# Patient Record
Sex: Male | Born: 1935 | Race: White | Hispanic: No | Marital: Single | Smoking: Former smoker
Health system: Southern US, Community
[De-identification: ages and names within clinical notes are randomized; demographics above are authoritative.]

## PROBLEM LIST (undated history)

## (undated) DIAGNOSIS — E7800 Pure hypercholesterolemia, unspecified: Secondary | ICD-10-CM

## (undated) DIAGNOSIS — E079 Disorder of thyroid, unspecified: Secondary | ICD-10-CM

## (undated) DIAGNOSIS — I509 Heart failure, unspecified: Secondary | ICD-10-CM

## (undated) DIAGNOSIS — I4891 Unspecified atrial fibrillation: Secondary | ICD-10-CM

## (undated) DIAGNOSIS — I10 Essential (primary) hypertension: Secondary | ICD-10-CM

## (undated) HISTORY — PX: CAROTID STENT: SHX1301

## (undated) HISTORY — PX: APPENDECTOMY: SHX54

## (undated) HISTORY — PX: REPLACEMENT TOTAL KNEE: SUR1224

---

## 2011-10-22 ENCOUNTER — Inpatient Hospital Stay (HOSPITAL_COMMUNITY)
Admission: EM | Admit: 2011-10-22 | Discharge: 2011-10-28 | DRG: 291 | Disposition: A | Discharge status: Skilled Nursing Facility | Payer: Medicare Other | Attending: Internal Medicine | Admitting: Internal Medicine

## 2011-10-22 ENCOUNTER — Encounter (HOSPITAL_COMMUNITY): Payer: Self-pay | Admitting: *Deleted

## 2011-10-22 ENCOUNTER — Emergency Department (HOSPITAL_COMMUNITY): Payer: Medicare Other

## 2011-10-22 DIAGNOSIS — I498 Other specified cardiac arrhythmias: Secondary | ICD-10-CM | POA: Diagnosis not present

## 2011-10-22 DIAGNOSIS — I509 Heart failure, unspecified: Principal | ICD-10-CM | POA: Diagnosis present

## 2011-10-22 DIAGNOSIS — E1149 Type 2 diabetes mellitus with other diabetic neurological complication: Secondary | ICD-10-CM | POA: Diagnosis present

## 2011-10-22 DIAGNOSIS — I10 Essential (primary) hypertension: Secondary | ICD-10-CM | POA: Diagnosis present

## 2011-10-22 DIAGNOSIS — Z794 Long term (current) use of insulin: Secondary | ICD-10-CM

## 2011-10-22 DIAGNOSIS — Z96659 Presence of unspecified artificial knee joint: Secondary | ICD-10-CM

## 2011-10-22 DIAGNOSIS — E039 Hypothyroidism, unspecified: Secondary | ICD-10-CM | POA: Diagnosis present

## 2011-10-22 DIAGNOSIS — J189 Pneumonia, unspecified organism: Secondary | ICD-10-CM | POA: Diagnosis present

## 2011-10-22 DIAGNOSIS — R001 Bradycardia, unspecified: Secondary | ICD-10-CM | POA: Diagnosis present

## 2011-10-22 DIAGNOSIS — IMO0001 Reserved for inherently not codable concepts without codable children: Secondary | ICD-10-CM | POA: Diagnosis present

## 2011-10-22 DIAGNOSIS — E7800 Pure hypercholesterolemia, unspecified: Secondary | ICD-10-CM | POA: Diagnosis present

## 2011-10-22 DIAGNOSIS — F411 Generalized anxiety disorder: Secondary | ICD-10-CM | POA: Diagnosis present

## 2011-10-22 DIAGNOSIS — J441 Chronic obstructive pulmonary disease with (acute) exacerbation: Secondary | ICD-10-CM | POA: Diagnosis present

## 2011-10-22 DIAGNOSIS — E876 Hypokalemia: Secondary | ICD-10-CM | POA: Diagnosis present

## 2011-10-22 DIAGNOSIS — I2510 Atherosclerotic heart disease of native coronary artery without angina pectoris: Secondary | ICD-10-CM | POA: Insufficient documentation

## 2011-10-22 DIAGNOSIS — Z87891 Personal history of nicotine dependence: Secondary | ICD-10-CM

## 2011-10-22 DIAGNOSIS — I4891 Unspecified atrial fibrillation: Secondary | ICD-10-CM | POA: Diagnosis present

## 2011-10-22 DIAGNOSIS — Z79899 Other long term (current) drug therapy: Secondary | ICD-10-CM

## 2011-10-22 DIAGNOSIS — E119 Type 2 diabetes mellitus without complications: Secondary | ICD-10-CM | POA: Diagnosis present

## 2011-10-22 HISTORY — DX: Essential (primary) hypertension: I10

## 2011-10-22 HISTORY — DX: Disorder of thyroid, unspecified: E07.9

## 2011-10-22 HISTORY — DX: Heart failure, unspecified: I50.9

## 2011-10-22 HISTORY — DX: Pure hypercholesterolemia, unspecified: E78.00

## 2011-10-22 HISTORY — DX: Unspecified atrial fibrillation: I48.91

## 2011-10-22 LAB — COMPREHENSIVE METABOLIC PANEL
AST: 15 U/L (ref 0–37)
Albumin: 3.5 g/dL (ref 3.5–5.2)
Alkaline Phosphatase: 61 U/L (ref 39–117)
BUN: 11 mg/dL (ref 6–23)
Chloride: 98 mEq/L (ref 96–112)
Potassium: 3 mEq/L — ABNORMAL LOW (ref 3.5–5.1)
Sodium: 136 mEq/L (ref 135–145)
Total Bilirubin: 0.9 mg/dL (ref 0.3–1.2)
Total Protein: 6.9 g/dL (ref 6.0–8.3)

## 2011-10-22 LAB — CBC
Hemoglobin: 13.8 g/dL (ref 13.0–17.0)
MCH: 28.5 pg (ref 26.0–34.0)
MCHC: 33.7 g/dL (ref 30.0–36.0)
Platelets: 137 10*3/uL — ABNORMAL LOW (ref 150–400)
RDW: 13.9 % (ref 11.5–15.5)

## 2011-10-22 LAB — DIFFERENTIAL
Basophils Absolute: 0 10*3/uL (ref 0.0–0.1)
Basophils Relative: 0 % (ref 0–1)
Eosinophils Absolute: 0.1 10*3/uL (ref 0.0–0.7)
Neutro Abs: 4.8 10*3/uL (ref 1.7–7.7)
Neutrophils Relative %: 68 % (ref 43–77)

## 2011-10-22 LAB — GLUCOSE, CAPILLARY
Glucose-Capillary: 158 mg/dL — ABNORMAL HIGH (ref 70–99)
Glucose-Capillary: 76 mg/dL (ref 70–99)

## 2011-10-22 LAB — TSH: TSH: 3.619 u[IU]/mL (ref 0.350–4.500)

## 2011-10-22 LAB — PRO B NATRIURETIC PEPTIDE: Pro B Natriuretic peptide (BNP): 6241 pg/mL — ABNORMAL HIGH (ref 0–450)

## 2011-10-22 LAB — MAGNESIUM: Magnesium: 2.3 mg/dL (ref 1.5–2.5)

## 2011-10-22 LAB — TROPONIN I: Troponin I: <0.3 ng/mL (ref <0.30)

## 2011-10-22 MED ORDER — SODIUM CHLORIDE 0.9 % IV SOLN: 250.0000 mL | INTRAVENOUS | Status: DC | PRN

## 2011-10-22 MED ORDER — PANTOPRAZOLE SODIUM 40 MG PO TBEC
40.0000 mg | DELAYED_RELEASE_TABLET | Freq: Every day | ORAL | Status: DC
  Administered 2011-10-23 – 2011-10-28 (×6): 40 mg via ORAL
  Filled 2011-10-22 (×9): qty 1

## 2011-10-22 MED ORDER — NITROGLYCERIN 2 % TD OINT
0.5000 [in_us] | TOPICAL_OINTMENT | Freq: Once | TRANSDERMAL | Status: AC
  Administered 2011-10-22: 0.5 [in_us] via TOPICAL
  Filled 2011-10-22: qty 30

## 2011-10-22 MED ORDER — MAGNESIUM HYDROXIDE 400 MG/5ML PO SUSP
15.0000 mL | Freq: Every day | ORAL | Status: DC | PRN
  Administered 2011-10-24: 15 mL via ORAL
  Filled 2011-10-22: qty 30

## 2011-10-22 MED ORDER — ACETAMINOPHEN 325 MG PO TABS: 650.0000 mg | ORAL_TABLET | Freq: Four times a day (QID) | ORAL | Status: DC | PRN

## 2011-10-22 MED ORDER — CARVEDILOL 12.5 MG PO TABS
12.5000 mg | ORAL_TABLET | Freq: Two times a day (BID) | ORAL | Status: DC
  Administered 2011-10-22 – 2011-10-24 (×4): 12.5 mg via ORAL
  Filled 2011-10-22 (×5): qty 1

## 2011-10-22 MED ORDER — FUROSEMIDE 10 MG/ML IJ SOLN
80.0000 mg | Freq: Once | INTRAMUSCULAR | Status: AC
  Administered 2011-10-22: 80 mg via INTRAVENOUS
  Filled 2011-10-22: qty 8

## 2011-10-22 MED ORDER — ISOSORBIDE MONONITRATE ER 60 MG PO TB24
60.0000 mg | ORAL_TABLET | Freq: Every day | ORAL | Status: DC
  Administered 2011-10-22 – 2011-10-28 (×7): 60 mg via ORAL
  Filled 2011-10-22 (×7): qty 1

## 2011-10-22 MED ORDER — BISACODYL 5 MG PO TBEC
5.0000 mg | DELAYED_RELEASE_TABLET | Freq: Every day | ORAL | Status: DC | PRN
  Filled 2011-10-22: qty 1

## 2011-10-22 MED ORDER — IPRATROPIUM BROMIDE 0.02 % IN SOLN
0.5000 mg | Freq: Four times a day (QID) | RESPIRATORY_TRACT | Status: DC | PRN
  Administered 2011-10-24: 0.5 mg via RESPIRATORY_TRACT
  Filled 2011-10-22: qty 2.5

## 2011-10-22 MED ORDER — ALBUTEROL SULFATE (5 MG/ML) 0.5% IN NEBU: 2.5000 mg | INHALATION_SOLUTION | Freq: Four times a day (QID) | RESPIRATORY_TRACT | Status: DC | PRN

## 2011-10-22 MED ORDER — POTASSIUM CHLORIDE CRYS ER 20 MEQ PO TBCR
40.0000 meq | EXTENDED_RELEASE_TABLET | ORAL | Status: AC
  Administered 2011-10-22: 40 meq via ORAL
  Filled 2011-10-22: qty 1

## 2011-10-22 MED ORDER — SODIUM CHLORIDE 0.9 % IV SOLN
Freq: Once | INTRAVENOUS | Status: AC
  Administered 2011-10-22: 11:00:00 via INTRAVENOUS

## 2011-10-22 MED ORDER — HEPARIN SODIUM (PORCINE) 5000 UNIT/ML IJ SOLN
5000.0000 [IU] | Freq: Three times a day (TID) | INTRAMUSCULAR | Status: DC
  Administered 2011-10-22 – 2011-10-28 (×20): 5000 [IU] via SUBCUTANEOUS
  Filled 2011-10-22 (×21): qty 1

## 2011-10-22 MED ORDER — SODIUM CHLORIDE 0.9 % IJ SOLN: 3.0000 mL | INTRAMUSCULAR | Status: DC | PRN

## 2011-10-22 MED ORDER — CLOPIDOGREL BISULFATE 75 MG PO TABS
75.0000 mg | ORAL_TABLET | Freq: Every day | ORAL | Status: DC
  Administered 2011-10-22 – 2011-10-28 (×7): 75 mg via ORAL
  Filled 2011-10-22 (×7): qty 1

## 2011-10-22 MED ORDER — POTASSIUM CHLORIDE CRYS ER 20 MEQ PO TBCR
20.0000 meq | EXTENDED_RELEASE_TABLET | Freq: Every day | ORAL | Status: DC
  Administered 2011-10-22 – 2011-10-23 (×2): 20 meq via ORAL
  Filled 2011-10-22 (×2): qty 1

## 2011-10-22 MED ORDER — SODIUM CHLORIDE 0.9 % IJ SOLN
3.0000 mL | Freq: Two times a day (BID) | INTRAMUSCULAR | Status: DC
  Administered 2011-10-24 – 2011-10-28 (×3): 3 mL via INTRAVENOUS

## 2011-10-22 MED ORDER — ZOLPIDEM TARTRATE 10 MG PO TABS
10.0000 mg | ORAL_TABLET | Freq: Every evening | ORAL | Status: DC | PRN
  Administered 2011-10-22: 10 mg via ORAL
  Filled 2011-10-22: qty 1

## 2011-10-22 MED ORDER — INSULIN ASPART 100 UNIT/ML ~~LOC~~ SOLN
0.0000 [IU] | Freq: Three times a day (TID) | SUBCUTANEOUS | Status: DC
  Administered 2011-10-22 – 2011-10-23 (×3): 3 [IU] via SUBCUTANEOUS
  Administered 2011-10-24 (×2): 2 [IU] via SUBCUTANEOUS
  Administered 2011-10-24: 3 [IU] via SUBCUTANEOUS
  Administered 2011-10-25: 11 [IU] via SUBCUTANEOUS
  Administered 2011-10-25: 5 [IU] via SUBCUTANEOUS
  Administered 2011-10-25: 8 [IU] via SUBCUTANEOUS
  Administered 2011-10-26: 5 [IU] via SUBCUTANEOUS
  Administered 2011-10-26: 3 [IU] via SUBCUTANEOUS
  Administered 2011-10-26: 8 [IU] via SUBCUTANEOUS
  Administered 2011-10-27: 5 [IU] via SUBCUTANEOUS
  Administered 2011-10-27: 8 [IU] via SUBCUTANEOUS
  Administered 2011-10-27: 5 [IU] via SUBCUTANEOUS
  Administered 2011-10-28: 3 [IU] via SUBCUTANEOUS
  Administered 2011-10-28: 5 [IU] via SUBCUTANEOUS

## 2011-10-22 MED ORDER — ASPIRIN EC 81 MG PO TBEC
81.0000 mg | DELAYED_RELEASE_TABLET | Freq: Every day | ORAL | Status: DC
  Administered 2011-10-22 – 2011-10-28 (×7): 81 mg via ORAL
  Filled 2011-10-22 (×7): qty 1

## 2011-10-22 MED ORDER — SODIUM CHLORIDE 0.9 % IJ SOLN
3.0000 mL | Freq: Two times a day (BID) | INTRAMUSCULAR | Status: DC
  Administered 2011-10-22 – 2011-10-28 (×11): 3 mL via INTRAVENOUS

## 2011-10-22 MED ORDER — ACETAMINOPHEN 650 MG RE SUPP: 650.0000 mg | Freq: Four times a day (QID) | RECTAL | Status: DC | PRN

## 2011-10-22 MED ORDER — SIMVASTATIN 10 MG PO TABS
10.0000 mg | ORAL_TABLET | Freq: Every day | ORAL | Status: DC
  Administered 2011-10-22 – 2011-10-27 (×6): 10 mg via ORAL
  Filled 2011-10-22 (×7): qty 1

## 2011-10-22 MED ORDER — ALPRAZOLAM 0.25 MG PO TABS
0.2500 mg | ORAL_TABLET | Freq: Once | ORAL | Status: AC
  Administered 2011-10-22: 0.25 mg via ORAL
  Filled 2011-10-22: qty 1

## 2011-10-22 MED ORDER — INSULIN ASPART 100 UNIT/ML ~~LOC~~ SOLN
0.0000 [IU] | Freq: Every day | SUBCUTANEOUS | Status: DC
  Administered 2011-10-24: 5 [IU] via SUBCUTANEOUS
  Administered 2011-10-25: 2 [IU] via SUBCUTANEOUS
  Administered 2011-10-26: 3 [IU] via SUBCUTANEOUS

## 2011-10-22 MED ORDER — LEVOFLOXACIN 500 MG PO TABS
500.0000 mg | ORAL_TABLET | Freq: Every day | ORAL | Status: AC
  Administered 2011-10-22 – 2011-10-25 (×4): 500 mg via ORAL
  Filled 2011-10-22 (×4): qty 1

## 2011-10-22 MED ORDER — GLIMEPIRIDE 2 MG PO TABS
2.0000 mg | ORAL_TABLET | Freq: Two times a day (BID) | ORAL | Status: DC
  Administered 2011-10-22 – 2011-10-28 (×12): 2 mg via ORAL
  Filled 2011-10-22 (×14): qty 1

## 2011-10-22 MED ORDER — FUROSEMIDE 10 MG/ML IJ SOLN
40.0000 mg | Freq: Two times a day (BID) | INTRAMUSCULAR | Status: DC
  Administered 2011-10-22 – 2011-10-23 (×3): 40 mg via INTRAVENOUS
  Filled 2011-10-22 (×6): qty 4

## 2011-10-22 MED ORDER — LEVOTHYROXINE SODIUM 25 MCG PO TABS
25.0000 ug | ORAL_TABLET | Freq: Every day | ORAL | Status: DC
  Administered 2011-10-23 – 2011-10-28 (×6): 25 ug via ORAL
  Filled 2011-10-22 (×7): qty 1

## 2011-10-22 NOTE — ED Notes (Signed)
Pt is getting agitated and getting himself worked up. rn and pt daughter reminded pt to stay calm and just relax. Pt urinal emptied.

## 2011-10-22 NOTE — ED Provider Notes (Signed)
History     CSN: [id number]  Arrival date & time 10/22/11  4098   First MD Initiated Contact with Patient 10/22/11 [phone]      Chief Complaint  Patient presents with  . Cough    (Consider location/radiation/quality/duration/timing/severity/associated sxs/prior treatment) HPI This elderly patient presents with concerns of cough, mild dyspnea.  He notes that his symptoms began approximately 3 weeks ago, gradually.  His crit his initial symptoms of generalized discomfort, cough.  5 days ago he was diagnosed with pneumonia at his primary care physician's office.  For the following days he has been taking antibiotics, most recently levofloxacin.  He notes that in spite of this therapy he continues to have cough, dyspnea.  He denies any chest pain, near-syncope, vomiting, diarrhea.  He notes continued intermittent lower extremity edema. The patient denies confusion, disorientation, ataxia, but the patient's daughter notes that he is more confused than typical. Notably, the patient had pulmonary function tests within the past week that were reported to be normal.  He does have a history of both COPD, and CHF as well as CAD. Past Medical History  Diagnosis Date  . Hypertension   . Diabetes mellitus   . Thyroid disease   . High cholesterol   . Atrial fibrillation   . CHF (congestive heart failure)   . Emphysema     35% of lungs    Past Surgical History  Procedure Date  . Carotid stent     on blood thinner for stent  . Replacement total knee     left knee  . Appendectomy     History reviewed. No pertinent family history.  History  Substance Use Topics  . Smoking status: Former Smoker -- 3.0 packs/day    Types: Cigarettes  . Smokeless tobacco: Not on file  . Alcohol Use: No      Review of Systems  Constitutional:       Per HPI, otherwise negative  HENT:       Per HPI, otherwise negative  Eyes: Negative.   Respiratory:       Per HPI, otherwise negative  Cardiovascular:         Per HPI, otherwise negative  Gastrointestinal: Negative for vomiting.  Genitourinary: Negative.   Musculoskeletal:       Per HPI, otherwise negative  Skin: Negative.   Neurological: Negative for syncope.    Allergies  Sulfa drugs cross reactors  Home Medications  No current outpatient prescriptions on file.  BP 135/62  Pulse 60  Temp(Src) 97.8 F (36.6 C) (Oral)  Resp 22  SpO2 96%  Physical Exam  Nursing note and vitals reviewed. Constitutional: He is oriented to person, place, and time. He appears well-developed. No distress.  HENT:  Head: Normocephalic and atraumatic.  Eyes: Conjunctivae and EOM are normal.  Cardiovascular: Normal rate and regular rhythm.   Pulmonary/Chest: Effort normal. No stridor. No respiratory distress.  Abdominal: He exhibits no distension.  Musculoskeletal: He exhibits no edema.  Neurological: He is alert and oriented to person, place, and time.  Skin: Skin is warm and dry.  Psychiatric: He has a normal mood and affect.    ED Course  Procedures (including critical care time)   Labs Reviewed  CBC  DIFFERENTIAL  COMPREHENSIVE METABOLIC PANEL  PRO B NATRIURETIC PEPTIDE   No results found.   No diagnosis found.  Cardiac monitor: 61- afib, abnormal  Pulse ox 97% ra- normal   Date: 10/22/2011  Rate: 67  Rhythm: atrial fibrillation  QRS Axis: normal  Intervals: normal  ST/T Wave abnormalities: diffuse non-specific changes  Conduction Disutrbances:none and nonspecific intraventricular conduction delay  Narrative Interpretation:   Old EKG Reviewed: none available ABNORMAL  CXR reviewed by me MDM  This previously well male presents with concerns over ongoing cough, dyspnea.  Notably, the patient was recently diagnosed with pneumonia, though it is unclear if this was done after radiographic imaging.  On my examination is in no distress, though has a mild cough.  Patient has no wheezing.  Given the patient's history of COPD,  CHF, this percentage is likely multifactorial, though his recent unremarkable long function tests and today his elevated BNP is most consistent with a heart failure exacerbation.  The patient received Lasix, topical nitroglycerin in the ED.  He was admitted for further evaluation and management.       Gerhard Munch, MD 10/22/11 1130

## 2011-10-22 NOTE — ED Notes (Signed)
rn encouraged pt to relax, pt wants to get up and get out of bed. rn told pt that he needed to stay in bed right now and that his meal tray had been ordered.

## 2011-10-22 NOTE — ED Notes (Addendum)
hospitalist to see pt

## 2011-10-22 NOTE — H&P (Signed)
Hospital Admission Note Date: 10/22/2011  Patient name: Carlos Morton Medical record number: [id number] Date of birth: January 19, 1917 Age: 78 y.o. Gender: male PCP: Karle Plumber, MD, MD  Attending physician: Altha Harm, MD  Chief Complaint:SOB and DOE x several days.  History of Present Illness: Patient is a elderly gentleman who has been having shortness of breath and dyspnea on exertion for several weeks. He was seen by his primary care physician and treated for pneumonia. He was also given a double shot of antibiotics and steroids proximately 4 days ago and transitioned to Levaquin 750 mg to take a total of 7 days. The patient is currently on day #3 of Levaquin. According to the patient's daughter he had only a very low-grade temperature of 99.0 however never had any fever or chills. He has been coughing which has been productive of whitish sputum. The patient denies any orthopnea or PND. He states that he has chronic pedal edema which usually receives at night when he puts his legs up. He has not noted any change in the pedal edema. The patient does not check his weight daily and is unable to tell me he is having significant leaking. However from the field the skull to does not appear he has had any weight gain. The patient's daughter also reports that she has noted that he's been wheezing at night and states that he received an inhaler from his primary care physician on Monday I will she's been using about 3 times a day. Despite all these interventions the patient does not appear to be making any progress in recovering and his feeling of weakness and shortness of breath appears to be getting worse as they came to the emergency room today for further evaluation and management.  The patient denies any chest pain or palpitations, he denies any dizziness, he denies any loss of consciousness. The patient's daughter does report that he has a component of anxiety which is exhibited in the emergency  room and she states that that has been occurring since the patient has been ill. She also states that the patient appears to have some periods of disorientation in the recent weeks.  Scheduled Meds:   . sodium chloride   Intravenous Once  . ALPRAZolam  0.25 mg Oral Once  . aspirin EC  81 mg Oral Daily  . carvedilol  12.5 mg Oral BID AC  . clopidogrel  75 mg Oral Daily  . furosemide  40 mg Intravenous Q12H  . furosemide  80 mg Intravenous Once  . glimepiride  2 mg Oral BID AC  . heparin  5,000 Units Subcutaneous Q8H  . insulin aspart  0-15 Units Subcutaneous TID WC  . insulin aspart  0-5 Units Subcutaneous QHS  . isosorbide mononitrate  60 mg Oral Daily  . levofloxacin  500 mg Oral Daily  . levothyroxine  25 mcg Oral Daily  . nitroGLYCERIN  0.5 inch Topical Once  . pantoprazole  40 mg Oral Q0600  . potassium chloride SA  20 mEq Oral Daily  . simvastatin  10 mg Oral q1800  . sodium chloride  3 mL Intravenous Q12H  . sodium chloride  3 mL Intravenous Q12H   Continuous Infusions:  PRN Meds:.sodium chloride, acetaminophen, acetaminophen, albuterol, bisacodyl, ipratropium, magnesium hydroxide, sodium chloride, zolpidem Allergies: Sulfa drugs cross reactors Past Medical History  Diagnosis Date  . Hypertension   . Diabetes mellitus   . Thyroid disease   . High cholesterol   . Atrial fibrillation   .  CHF (congestive heart failure)   . Emphysema     35% of lungs   Past Surgical History  Procedure Date  . Carotid stent     on blood thinner for stent  . Replacement total knee     left knee  . Appendectomy    History reviewed. No pertinent family history. History   Social History  . Marital Status: Single    Spouse Name: N/A    Number of Children: N/A  . Years of Education: N/A   Occupational History  . Not on file.   Social History Main Topics  . Smoking status: Former Smoker -- 3.0 packs/day    Types: Cigarettes  . Smokeless tobacco: Not on file  . Alcohol Use: No   . Drug Use: No  . Sexually Active:    Other Topics Concern  . Not on file   Social History Narrative  . No narrative on file   Review of Systems: A comprehensive review of systems was negative. Physical Exam:  Intake/Output Summary (Last 24 hours) at 10/22/11 1246 Last data filed at 10/22/11 1145  Gross per 24 hour  Intake      0 ml  Output    650 ml  Net   -650 ml   General: Alert, awake, oriented x3, in no acute distress.  HEENT: [place]/AT [place]ER[place], EOMI Neck: Trachea midline,  no masses, no thyromegal,y no JVD, no carotid bruit OROPHARYNX:  Moist, No exudate/ erythema/lesions.  Heart: Regular rate and rhythm, without murmurs, rubs, gallops, PMI non-displaced, no heaves or thrills on palpation.  Lungs: Mild diffuse wheezing. No rhonchi noted. No increased vocal fremitus.  Abdomen: Soft, nontender, nondistended, positive bowel sounds, no masses no hepatosplenomegaly noted..  Neuro: No focal neurological deficits noted cranial nerves II through XII grossly intact. DTRs 2+ bilaterally upper and lower extremities. Strength functional in bilateral upper and lower extremities. Musculoskeletal: No warm swelling or erythema around joints, no spinal tenderness noted. Psychiatric: Patient alert and oriented x3, but agitated. Lymph node survey: No cervical axillary or inguinal lymphadenopathy noted.  Lab results:  [facility] 10/22/11 0910  NA 136  K 3.0*  CL 98  CO2 29  GLUCOSE 175*  BUN 11  CREATININE 0.79  CALCIUM 8.9  MG --  PHOS --    Basename 10/22/11 0910  AST 15  ALT 8  ALKPHOS 61  BILITOT 0.9  PROT 6.9  ALBUMIN 3.5   No results found for this basename: LIPASE:2,AMYLASE:2 in the last 72 hours  Basename 10/22/11 0910  WBC 7.0  NEUTROABS 4.8  HGB 13.8  HCT 41.0  MCV 84.7  PLT 137*    Basename 10/22/11 0910  CKTOTAL --  CKMB --  CKMBINDEX --  TROPONINI <0.30   No components found with this basename: POCBNP:3 No results found for this basename: DDIMER:2 in  the last 72 hours No results found for this basename: HGBA1C:2 in the last 72 hours No results found for this basename: CHOL:2,HDL:2,LDLCALC:2,TRIG:2,CHOLHDL:2,LDLDIRECT:2 in the last 72 hours No results found for this basename: TSH,T4TOTAL,FREET3,T3FREE,THYROIDAB in the last 72 hours No results found for this basename: VITAMINB12:2,FOLATE:2,FERRITIN:2,TIBC:2,IRON:2,RETICCTPCT:2 in the last 72 hours Imaging results:  Dg Chest 2 View  10/22/2011  *RADIOLOGY REPORT*  Clinical Data: Diabetes, shortness of breath, congestion  CHEST - 2 VIEW  Comparison: None.  Findings: The lungs are slightly hyperaerated suggesting a degree of COPD.  Minimally prominent interstitial markings are present at the lung bases most likely chronic.  Moderate cardiomegaly is present.  The  bones are osteopenic.  There is a mild kyphoscoliosis present.  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  No definite active process. Moderate cardiomegaly.  Original Report Authenticated By: Juline Patch, M.D.   Other results: EKG: normal EKG, normal sinus rhythm, unchanged from previous tracings, atrial fibrillation, rate controlled.   Patient Active Hospital Problem List: Diabetes mellitus type 2 with complications (10/22/2011)   Assessment: Patient has diabetes type 2 and reports diabetic neuropathy associated with it. I will check a hemoglobin A1c put the patient on sliding scale and root see him his Amaryl     CHF exacerbation (10/22/2011)   Assessment: The patient appears to have some component of CHF exacerbation. It is unclear as to whether or not this patient has systolic or diastolic dysfunction. I will try to obtain records from his primary care physician's office for 2-D echocardiogram and recent stress test that he reports he's had within the last year. He does have an elevated pro BNP we will go ahead with gentle diuresis with IV Lasix and monitor his weights daily as well as an ice to nose and keep a close eye on his  renal function. I will also attempt to get records from his primary care physician's office for long-term assessment of his renal function.    COPD bronchitis (10/22/2011)   Assessment: The patient has known emphysema. I believe the patient may be having a component of bronchitis is contributing to this. He'll be observed for exacerbation of COPD and the need for steroids. At present I do not believe that steroids are indicated in this patient as he does not have an increased work of breathing and his saturations are within normal limits.   Hypothyroidism (acquired) (10/22/2011)   Assessment: We will check a TSH on the patient and continue his Synthroid at prehospital prescribed dosing    Atrial Fibrillation with controlled ventricular response (10/22/2011)   Assessment: Patient has a history of atrial fibrillation presently has a controlled ventricular response. He'll be on telemetry monitoring and we will monitor him for any loss of rate control.    Hypokalemia   Assessment: Repleted orally     Anxiety   Assessment: We'll do a trial of Xanax and see how patient responds.    MATTHEWS,MICHELLE A. 10/22/2011, 12:46 PM

## 2011-10-22 NOTE — ED Notes (Signed)
md at bedside

## 2011-10-22 NOTE — ED Notes (Signed)
Pt  Provided with a urinal and extra pillow.

## 2011-10-22 NOTE — ED Notes (Addendum)
Per ems pt is from home. Alert and oriented x4, needs assistance with ambulation slightly. Pt had the flu 3 weeks ago, with vomitting spells. For the last 2 weeks pt has had productive white cough. Pt reports he is on abx, but ems did not see any abx medications. Pt will get coughing spells and then have painful breathing. Pt reports his O2 stats remain low normally, O2 did not drop below 95% on room air the whole transport.  Pt dx with pneumonia 6 days ago. On Levofloxacin 500 mg PO daily, started abx 4/15.   Pt also reports that he has CHF and in the past "had emphasema" and that a few years ago his dr said 35% of his lungs were affected.  Pt had a lung capacity test 2 days ago and dr said scores were great.

## 2011-10-22 NOTE — ED Notes (Signed)
First attempt at giving report, floor rn will call back in 5 min

## 2011-10-22 NOTE — Progress Notes (Signed)
   CARE MANAGEMENT NOTE 10/22/2011  Patient:  Carlos Morton, Carlos Morton   Account Number:  [id number]  Date Initiated:  10/22/2011  Documentation initiated by:  Lanier Clam  Subjective/Objective Assessment:   ADMITTED W/COUGH.HX: DM,COPD,CHF.     Action/Plan:   FROM [facility].   Anticipated DC Date:  10/26/2011   Anticipated DC Plan:  HOME/SELF CARE         Choice offered to / List presented to:             Status of service:  In process, will continue to follow Medicare Important Message given?   (If response is "NO", the following Medicare IM given date fields will be blank) Date Medicare IM given:   Date Additional Medicare IM given:    Discharge Disposition:    Per UR Regulation:  Reviewed for med. necessity/level of care/duration of stay  If discussed at Long Length of Stay Meetings, dates discussed:    Comments:  10/22/11 [facility] RN,BSN NCM [phone]

## 2011-10-22 NOTE — ED Notes (Addendum)
Pt to xray  Pt alert and oriented x4. Respirations even and unlabored, bilateral symmetrical rise and fall of chest. Skin warm and dry. In no acute distress. Denies needs.   

## 2011-10-22 NOTE — ED Notes (Signed)
Report given to amy, rn on floor.  

## 2011-10-22 NOTE — ED Notes (Signed)
Pt provided with gram crackers and peanut butter.

## 2011-10-23 LAB — BASIC METABOLIC PANEL
CO2: 29 mEq/L (ref 19–32)
Chloride: 98 mEq/L (ref 96–112)
Glucose, Bld: 148 mg/dL — ABNORMAL HIGH (ref 70–99)
Potassium: 3.3 mEq/L — ABNORMAL LOW (ref 3.5–5.1)
Sodium: 138 mEq/L (ref 135–145)

## 2011-10-23 LAB — GLUCOSE, CAPILLARY
Glucose-Capillary: 155 mg/dL — ABNORMAL HIGH (ref 70–99)
Glucose-Capillary: 112 mg/dL — ABNORMAL HIGH (ref 70–99)

## 2011-10-23 LAB — PRO B NATRIURETIC PEPTIDE: Pro B Natriuretic peptide (BNP): 5437 pg/mL — ABNORMAL HIGH (ref 0–450)

## 2011-10-23 LAB — MRSA PCR SCREENING: MRSA by PCR: NEGATIVE

## 2011-10-23 MED ORDER — POTASSIUM CHLORIDE CRYS ER 20 MEQ PO TBCR
40.0000 meq | EXTENDED_RELEASE_TABLET | Freq: Two times a day (BID) | ORAL | Status: DC
  Administered 2011-10-23 – 2011-10-28 (×10): 40 meq via ORAL
  Filled 2011-10-23 (×11): qty 2

## 2011-10-23 MED ORDER — FUROSEMIDE 10 MG/ML IJ SOLN
20.0000 mg | Freq: Three times a day (TID) | INTRAMUSCULAR | Status: DC
  Administered 2011-10-23 – 2011-10-27 (×12): 20 mg via INTRAVENOUS
  Filled 2011-10-23 (×15): qty 2

## 2011-10-23 MED ORDER — ALPRAZOLAM 0.25 MG PO TABS
0.2500 mg | ORAL_TABLET | Freq: Three times a day (TID) | ORAL | Status: DC | PRN
  Administered 2011-10-23 – 2011-10-27 (×9): 0.25 mg via ORAL
  Filled 2011-10-23 (×9): qty 1

## 2011-10-23 MED ORDER — LORAZEPAM 0.5 MG PO TABS
0.5000 mg | ORAL_TABLET | Freq: Once | ORAL | Status: AC
  Administered 2011-10-23: 0.5 mg via ORAL
  Filled 2011-10-23: qty 1

## 2011-10-23 MED ORDER — POTASSIUM CHLORIDE CRYS ER 20 MEQ PO TBCR
40.0000 meq | EXTENDED_RELEASE_TABLET | Freq: Once | ORAL | Status: AC
  Administered 2011-10-23: 40 meq via ORAL
  Filled 2011-10-23: qty 2

## 2011-10-23 MED ORDER — FUROSEMIDE 10 MG/ML IJ SOLN: 20.0000 mg | Freq: Two times a day (BID) | INTRAMUSCULAR | Status: DC

## 2011-10-23 NOTE — Progress Notes (Signed)
Subjective: Patient states that he had a difficult time sleeping last night. I suspect the patient is having anxiety. I had at that time with the patient and he agreed that his anxiety causes him to have some difficulty with sleeping and expressed fear that he felt that if he did not stay awake he may not wake up.  Objective: Filed Vitals:   10/22/11 1544 10/22/11 2118 10/23/11 0500 10/23/11 1300  BP: 151/89 106/59 112/62 98/56  Pulse: 67 57 62 55  Temp: 97.6 F (36.4 C) 97.3 F (36.3 C) 97.2 F (36.2 C) 97.6 F (36.4 C)  TempSrc: Axillary Oral Axillary Oral  Resp: 20 19 18 18   Height: 5\' 8"  (1.727 m)     Weight: 74.7 kg (164 lb 10.9 oz)  68.3 kg (150 lb 9.2 oz)   SpO2: 94% 96% 95% 95%   Weight change:   Intake/Output Summary (Last 24 hours) at 10/23/11 1749 Last data filed at 10/23/11 1617  Gross per 24 hour  Intake    761 ml  Output   1025 ml  Net   -264 ml    General: Alert, awake, oriented x3, in no acute distress. Mildly anxious.  HEENT: [place]/AT [place]ER[place], EOMI Neck: Trachea midline,  no masses, no thyromegal,y no JVD, no carotid bruit OROPHARYNX:  Moist, No exudate/ erythema/lesions.  Heart: Irregularly irregular. In atrial fibrillation with heart rate controlled. Lungs: Clear to auscultation, no wheezing or rhonchi noted.  Abdomen: Soft, nontender, nondistended, positive bowel sounds, no masses no hepatosplenomegaly noted. Psychiatric: Patient alert and oriented x3, mildly anxious.   Lab Results:  [facility] 10/23/11 0425 10/22/11 0910  NA 138 136  K 3.3* 3.0*  CL 98 98  CO2 29 29  GLUCOSE 148* 175*  BUN 16 11  CREATININE 1.13 0.79  CALCIUM 9.5 8.9  MG -- 2.3  PHOS -- --    Basename 10/22/11 0910  AST 15  ALT 8  ALKPHOS 61  BILITOT 0.9  PROT 6.9  ALBUMIN 3.5   No results found for this basename: LIPASE:2,AMYLASE:2 in the last 72 hours  Basename 10/22/11 0910  WBC 7.0  NEUTROABS 4.8  HGB 13.8  HCT 41.0  MCV 84.7  PLT 137*    Basename 10/22/11  0910  CKTOTAL --  CKMB --  CKMBINDEX --  TROPONINI <0.30   No components found with this basename: POCBNP:3 No results found for this basename: DDIMER:2 in the last 72 hours  Basename 10/22/11 1300  HGBA1C 7.6*   No results found for this basename: CHOL:2,HDL:2,LDLCALC:2,TRIG:2,CHOLHDL:2,LDLDIRECT:2 in the last 72 hours  Basename 10/22/11 1300  TSH 3.619  T4TOTAL --  T3FREE --  THYROIDAB --   No results found for this basename: VITAMINB12:2,FOLATE:2,FERRITIN:2,TIBC:2,IRON:2,RETICCTPCT:2 in the last 72 hours  Micro Results: Recent Results (from the past 240 hour(s))  MRSA PCR SCREENING     Status: Normal   Collection Time   10/23/11  2:17 AM      Component Value Range Status Comment   MRSA by PCR NEGATIVE  NEGATIVE  Final     Studies/Results: Dg Chest 2 View  10/22/2011  *RADIOLOGY REPORT*  Clinical Data: Diabetes, shortness of breath, congestion  CHEST - 2 VIEW  Comparison: None.  Findings: The lungs are slightly hyperaerated suggesting a degree of COPD.  Minimally prominent interstitial markings are present at the lung bases most likely chronic.  Moderate cardiomegaly is present.  The bones are osteopenic.  There is a mild kyphoscoliosis present.  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  No definite active process. Moderate cardiomegaly.  Original Report Authenticated By: Juline Patch, M.D.    Medications: I have reviewed the patient's current medications. Scheduled Meds:   . aspirin EC  81 mg Oral Daily  . carvedilol  12.5 mg Oral BID AC  . clopidogrel  75 mg Oral Daily  . furosemide  20 mg Intravenous TID  . glimepiride  2 mg Oral BID AC  . heparin  5,000 Units Subcutaneous Q8H  . insulin aspart  0-15 Units Subcutaneous TID WC  . insulin aspart  0-5 Units Subcutaneous QHS  . isosorbide mononitrate  60 mg Oral Daily  . levofloxacin  500 mg Oral Daily  . levothyroxine  25 mcg Oral QAC breakfast  . LORazepam  0.5 mg Oral Once  . pantoprazole  40 mg Oral  Q0600  . potassium chloride  40 mEq Oral BID  . potassium chloride  40 mEq Oral Once  . simvastatin  10 mg Oral q1800  . sodium chloride  3 mL Intravenous Q12H  . sodium chloride  3 mL Intravenous Q12H  . DISCONTD: furosemide  20 mg Intravenous Q12H  . DISCONTD: furosemide  40 mg Intravenous Q12H  . DISCONTD: potassium chloride SA  20 mEq Oral Daily   Continuous Infusions:  PRN Meds:.sodium chloride, acetaminophen, acetaminophen, albuterol, ALPRAZolam, bisacodyl, ipratropium, magnesium hydroxide, sodium chloride, zolpidem Assessment/Plan: Patient Active Hospital Problem List: Diabetes mellitus type 2 with complications (10/22/2011)   Assessment: Blood sugars well-controlled he air however hemoglobin A1c mildly elevated at 7.6 reflecting suboptimal control.    CHF exacerbation (10/22/2011)   Assessment: We'll continue diuresis with Lasix for now. I will cut the Lasix down to 60 mg per day in 3 divided doses versus 80 mg per day.    COPD bronchitis (10/22/2011)   Assessment: Patient has had very little cough today and there is no wheezing present we'll continue to give when necessary albuterol monitor him to    Hypothyroidism (acquired) (10/22/2011)   Assessment: Thyroid was supplemented with TSH in normal range of 3.619    Atrial fibrillation with controlled ventricular response (10/22/2011)   Assessment: Heart rate remains well controlled.       LOS: 1 day

## 2011-10-23 NOTE — Progress Notes (Signed)
CSW spoke with patients daughter. She is requesting SNF at [facility] upon discharge. At this time there is not enough information on the chart to complete and FL2. Will follow.  Elcie Pelster C. Kinston Magnan MSW, LCSW [phone]

## 2011-10-23 NOTE — Progress Notes (Signed)
   CARE MANAGEMENT NOTE 10/23/2011  Patient:  Carlos Morton, Carlos Morton   Account Number:  [id number]  Date Initiated:  10/22/2011  Documentation initiated by:  Lanier Clam  Subjective/Objective Assessment:   ADMITTED W/COUGH.HX: DM,COPD,CHF.     Action/Plan:   FROM [facility].   Anticipated DC Date:  10/26/2011   Anticipated DC Plan:  HOME/SELF CARE         Choice offered to / List presented to:             Status of service:  In process, will continue to follow Medicare Important Message given?   (If response is "NO", the following Medicare IM given date fields will be blank) Date Medicare IM given:   Date Additional Medicare IM given:    Discharge Disposition:    Per UR Regulation:  Reviewed for med. necessity/level of care/duration of stay  If discussed at Long Length of Stay Meetings, dates discussed:    Comments:  10/23/11 Rilynne Lonsway RN,BSN NCM [phone] SPOKE TO PATIENT/SON IN LAW ABOUT D/C PLANS.HAS CANE,RW.FAMILY WILL BE ABLE TO TRANSPORT HOME WHEN MED STABLE.  10/22/11 Danine Hor RN,BSN NCM [phone]

## 2011-10-24 LAB — BASIC METABOLIC PANEL
BUN: 23 mg/dL (ref 6–23)
Calcium: 9.5 mg/dL (ref 8.4–10.5)
Creatinine, Ser: 1.41 mg/dL — ABNORMAL HIGH (ref 0.50–1.35)
GFR calc Af Amer: 48 mL/min — ABNORMAL LOW (ref >90)

## 2011-10-24 LAB — PRO B NATRIURETIC PEPTIDE: Pro B Natriuretic peptide (BNP): 1770 pg/mL — ABNORMAL HIGH (ref 0–450)

## 2011-10-24 LAB — GLUCOSE, CAPILLARY
Glucose-Capillary: 142 mg/dL — ABNORMAL HIGH (ref 70–99)
Glucose-Capillary: 197 mg/dL — ABNORMAL HIGH (ref 70–99)
Glucose-Capillary: 126 mg/dL — ABNORMAL HIGH (ref 70–99)
Glucose-Capillary: 365 mg/dL — ABNORMAL HIGH (ref 70–99)

## 2011-10-24 MED ORDER — METHYLPREDNISOLONE SODIUM SUCC 125 MG IJ SOLR
60.0000 mg | Freq: Four times a day (QID) | INTRAMUSCULAR | Status: AC
Start: 2011-10-24 — End: 2011-10-26
  Administered 2011-10-24 – 2011-10-26 (×10): 60 mg via INTRAVENOUS
  Filled 2011-10-24 (×11): qty 0.96

## 2011-10-24 MED ORDER — ALBUTEROL SULFATE (5 MG/ML) 0.5% IN NEBU
INHALATION_SOLUTION | RESPIRATORY_TRACT | Status: AC
  Administered 2011-10-24: 2.5 mg
  Filled 2011-10-24: qty 0.5

## 2011-10-24 MED ORDER — ALBUTEROL SULFATE (5 MG/ML) 0.5% IN NEBU: 2.5000 mg | INHALATION_SOLUTION | RESPIRATORY_TRACT | Status: DC | PRN

## 2011-10-24 MED ORDER — CARVEDILOL 12.5 MG PO TABS
12.5000 mg | ORAL_TABLET | Freq: Two times a day (BID) | ORAL | Status: DC
  Administered 2011-10-24 – 2011-10-25 (×3): 12.5 mg via ORAL
  Filled 2011-10-24 (×5): qty 1

## 2011-10-24 NOTE — Progress Notes (Deleted)
.*   NOTE WRITTEN ON WRONG PATIENT*Pt HR as low as 32-37 then comes up to low 40's but sustaining. Pt is stable and asymptomatic. Pt had 2.9sec pause at ~1135. Dr Ashley Royalty made aware. Will continue to monitor.* NOTE WRITTEN ON WRONG PATIENT*

## 2011-10-24 NOTE — Progress Notes (Signed)
Subjective: Patient still having subjective difficulty breathing however he is not tachypneic or hypoxic. He denies a tight feeling of his chest but feels as though he's not able to breathe well. Objective: Filed Vitals:   10/24/11 0602 10/24/11 0915 10/24/11 1346 10/24/11 1418  BP: 114/66   112/66  Pulse: 60   45  Temp: 97.5 F (36.4 C)   96.5 F (35.8 C)  TempSrc: Oral   Axillary  Resp: 18   18  Height:      Weight: 70.308 kg (155 lb)     SpO2: 95% 91% 95% 99%   Weight change: -4.393 kg (-9 lb 10.9 oz)  Intake/Output Summary (Last 24 hours) at 10/24/11 1823 Last data filed at 10/24/11 1739  Gross per 24 hour  Intake    600 ml  Output   1700 ml  Net  -1100 ml    General: Alert, awake, oriented x3, in no acute distress. Mildly anxious.  HEENT: [place]/AT [place]ER[place], EOMI Neck: Trachea midline,  no masses, no thyromegal,y no JVD, no carotid bruit OROPHARYNX:  Moist, No exudate/ erythema/lesions.  Heart: Irregularly irregular. In atrial fibrillation with heart rate controlled. Lungs: Decreased air entry, no wheezing or rhonchi noted. Please note that after breathing treatment administered I listened to the patient and his air entry was improved Abdomen: Soft, nontender, nondistended, positive bowel sounds, no masses no hepatosplenomegaly noted. Psychiatric: Patient alert and oriented x3, mildly anxious.   Lab Results:  Basename 10/24/11 0437 10/23/11 0425 10/22/11 0910  NA 137 138 --  K 4.1 3.3* --  CL 99 98 --  CO2 30 29 --  GLUCOSE 133* 148* --  BUN 23 16 --  CREATININE 1.41* 1.13 --  CALCIUM 9.5 9.5 --  MG 2.0 -- 2.3  PHOS -- -- --    Basename 10/22/11 0910  AST 15  ALT 8  ALKPHOS 61  BILITOT 0.9  PROT 6.9  ALBUMIN 3.5   No results found for this basename: LIPASE:2,AMYLASE:2 in the last 72 hours  Basename 10/22/11 0910  WBC 7.0  NEUTROABS 4.8  HGB 13.8  HCT 41.0  MCV 84.7  PLT 137*    Basename 10/22/11 0910  CKTOTAL --  CKMB --  CKMBINDEX --  TROPONINI  <0.30   No components found with this basename: POCBNP:3 No results found for this basename: DDIMER:2 in the last 72 hours  Basename 10/22/11 1300  HGBA1C 7.6*   No results found for this basename: CHOL:2,HDL:2,LDLCALC:2,TRIG:2,CHOLHDL:2,LDLDIRECT:2 in the last 72 hours  Basename 10/22/11 1300  TSH 3.619  T4TOTAL --  T3FREE --  THYROIDAB --   No results found for this basename: VITAMINB12:2,FOLATE:2,FERRITIN:2,TIBC:2,IRON:2,RETICCTPCT:2 in the last 72 hours  Micro Results: Recent Results (from the past 240 hour(s))  MRSA PCR SCREENING     Status: Normal   Collection Time   10/23/11  2:17 AM      Component Value Range Status Comment   MRSA by PCR NEGATIVE  NEGATIVE  Final     Studies/Results: Dg Chest 2 View  10/22/2011  *RADIOLOGY REPORT*  Clinical Data: Diabetes, shortness of breath, congestion  CHEST - 2 VIEW  Comparison: None.  Findings: The lungs are slightly hyperaerated suggesting a degree of COPD.  Minimally prominent interstitial markings are present at the lung bases most likely chronic.  Moderate cardiomegaly is present.  The bones are osteopenic.  There is a mild kyphoscoliosis present.  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  No definite active process. Moderate cardiomegaly.  Original Report Authenticated  By: Juline Patch, M.D.    Medications: I have reviewed the patient's current medications. Scheduled Meds:    . albuterol      . aspirin EC  81 mg Oral Daily  . carvedilol  12.5 mg Oral BID WC  . clopidogrel  75 mg Oral Daily  . furosemide  20 mg Intravenous TID  . glimepiride  2 mg Oral BID AC  . heparin  5,000 Units Subcutaneous Q8H  . insulin aspart  0-15 Units Subcutaneous TID WC  . insulin aspart  0-5 Units Subcutaneous QHS  . isosorbide mononitrate  60 mg Oral Daily  . levofloxacin  500 mg Oral Daily  . levothyroxine  25 mcg Oral QAC breakfast  . methylPREDNISolone (SOLU-MEDROL) injection  60 mg Intravenous Q6H  . pantoprazole  40 mg Oral  Q0600  . potassium chloride  40 mEq Oral BID  . simvastatin  10 mg Oral q1800  . sodium chloride  3 mL Intravenous Q12H  . sodium chloride  3 mL Intravenous Q12H  . 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    CHF exacerbation (10/22/2011)   Assessment: We'll continue diuresis with Lasix for now. I will cut the Lasix down to 60 mg per day in 3 divided doses versus 80 mg per day. Patient's BNP is markedly improved we'll reassess the patient tomorrow consider transitioning to oral diuretics.    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 I will go ahead and initiate steroids on the and change his albuterol and Atrovent every 4 hours when necessary.   Hypothyroidism (acquired) (10/22/2011)   Assessment: Thyroid was supplemented with TSH in normal range of 3.619    Atrial fibrillation with controlled ventricular response (10/22/2011)   Assessment: Heart rate remains well controlled.       LOS: 2 days

## 2011-10-25 LAB — BASIC METABOLIC PANEL
Calcium: 10 mg/dL (ref 8.4–10.5)
GFR calc non Af Amer: 46 mL/min — ABNORMAL LOW (ref >90)
Sodium: 135 mEq/L (ref 135–145)

## 2011-10-25 LAB — GLUCOSE, CAPILLARY: Glucose-Capillary: 209 mg/dL — ABNORMAL HIGH (ref 70–99)

## 2011-10-25 MED ORDER — INSULIN GLARGINE 100 UNIT/ML ~~LOC~~ SOLN
10.0000 [IU] | Freq: Every day | SUBCUTANEOUS | Status: DC
  Administered 2011-10-25 – 2011-10-28 (×4): 10 [IU] via SUBCUTANEOUS

## 2011-10-25 NOTE — Progress Notes (Signed)
Subjective: Patient states that he feels much better today. He feels that his breathing is markedly improved and he does not feel as if he is unable to get his breath.  Objective: Filed Vitals:   10/24/11 1418 10/24/11 2120 10/25/11 0045 10/25/11 0536  BP: 112/66 109/61 110/62 154/70  Pulse: 45 50 62 60  Temp: 96.5 F (35.8 C) 97.1 F (36.2 C)  97.6 F (36.4 C)  TempSrc: Axillary Oral  Oral  Resp: 18 16 18 18   Height:      Weight:    70.8 kg (156 lb 1.4 oz)  SpO2: 99% 98%  94%   Weight change: 0.492 kg (1 lb 1.4 oz)  Intake/Output Summary (Last 24 hours) at 10/25/11 1650 Last data filed at 10/25/11 1300  Gross per 24 hour  Intake   1320 ml  Output    625 ml  Net    695 ml    General: Alert, awake, oriented x3, in no acute distress. Mildly anxious.  HEENT: [place]/AT [place], EOMI Neck: Trachea midline,  no masses, no thyromegal,y no JVD, no carotid bruit OROPHARYNX:  Moist, No exudate/ erythema/lesions.  Heart: Irregularly irregular. In atrial fibrillation with heart rate controlled. Lungs: Improved air entry throughout the lung fields, no wheezing or rhonchi noted.  Abdomen: Soft, nontender, nondistended, positive bowel sounds, no masses no hepatosplenomegaly noted. Psychiatric: Patient alert and oriented x3, mildly anxious.   Lab Results:  Basename 10/25/11 0423 10/24/11 0437  NA 135 137  K 4.4 4.1  CL 99 99  CO2 27 30  GLUCOSE 303* 133*  BUN 31* 23  CREATININE 1.31 1.41*  CALCIUM 10.0 9.5  MG -- 2.0  PHOS -- --   No results found for this basename: AST:2,ALT:2,ALKPHOS:2,BILITOT:2,PROT:2,ALBUMIN:2 in the last 72 hours No results found for this basename: LIPASE:2,AMYLASE:2 in the last 72 hours No results found for this basename: WBC:2,NEUTROABS:2,HGB:2,HCT:2,MCV:2,PLT:2 in the last 72 hours No results found for this basename: CKTOTAL:3,CKMB:3,CKMBINDEX:3,TROPONINI:3 in the last 72 hours No components found with this basename: POCBNP:3 No results found for this  basename: DDIMER:2 in the last 72 hours No results found for this basename: HGBA1C:2 in the last 72 hours No results found for this basename: CHOL:2,HDL:2,LDLCALC:2,TRIG:2,CHOLHDL:2,LDLDIRECT:2 in the last 72 hours No results found for this basename: TSH,T4TOTAL,FREET3,T3FREE,THYROIDAB in the last 72 hours No results found for this basename: VITAMINB12:2,FOLATE:2,FERRITIN:2,TIBC:2,IRON:2,RETICCTPCT:2 in the last 72 hours  Micro Results: Recent Results (from the past 240 hour(s))  MRSA PCR SCREENING     Status: Normal   Collection Time   10/23/11  2:17 AM      Component Value Range Status Comment   MRSA by PCR NEGATIVE  NEGATIVE  Final     Studies/Results: Dg Chest 2 View  10/22/2011  *RADIOLOGY REPORT*  Clinical Data: Diabetes, shortness of breath, congestion  CHEST - 2 VIEW  Comparison: None.  Findings: The lungs are slightly hyperaerated suggesting a degree of COPD.  Minimally prominent interstitial markings are present at the lung bases most likely chronic.  Moderate cardiomegaly is present.  The bones are osteopenic.  There is a mild kyphoscoliosis present.  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  No definite active process. Moderate cardiomegaly.  Original Report Authenticated By: Juline Patch, M.D.    Medications: I have reviewed the patient's current medications. Scheduled Meds:    . aspirin EC  81 mg Oral Daily  . carvedilol  12.5 mg Oral BID WC  . clopidogrel  75 mg Oral Daily  . furosemide  20  mg Intravenous TID  . glimepiride  2 mg Oral BID AC  . heparin  5,000 Units Subcutaneous Q8H  . insulin aspart  0-15 Units Subcutaneous TID WC  . insulin aspart  0-5 Units Subcutaneous QHS  . insulin glargine  10 Units Subcutaneous Daily  . isosorbide mononitrate  60 mg Oral Daily  . levofloxacin  500 mg Oral Daily  . levothyroxine  25 mcg Oral QAC breakfast  . methylPREDNISolone (SOLU-MEDROL) injection  60 mg Intravenous Q6H  . pantoprazole  40 mg Oral Q0600  .  potassium chloride  40 mEq Oral BID  . simvastatin  10 mg Oral q1800  . sodium chloride  3 mL Intravenous Q12H  . 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    CHF exacerbation (10/22/2011)   Assessment: We'll continue diuresis with Lasix for now. I will cut the Lasix down to 60 mg per day in 3 divided doses versus 80 mg per day. Will continue on current dose of Lasix the patient is receiving steroids as his glans itself to fluid retention. Recheck a BNP tomorrow and make further decisions about transition into oral Lasix.    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 He has markedly decreased work of breathing and reports that his air hunger has decreased. I will continue IV steroids for today and reassess the patient tomorrow regarding transitioning to oral steroids.   Hypothyroidism (acquired) (10/22/2011)   Assessment: Thyroid was supplemented with TSH in normal range of 3.619    Atrial fibrillation with controlled ventricular response (10/22/2011)   Assessment: Heart rate remains well controlled.       LOS: 3 days

## 2011-10-25 NOTE — Progress Notes (Signed)
Pt noted to have 2 second pause on telemetry monitor. VSS. Pt was sleeping/denies symptoms upon awakening. Lenny Pastel, NP made aware, no new oders. Will monitor.

## 2011-10-26 LAB — GLUCOSE, CAPILLARY
Glucose-Capillary: 188 mg/dL — ABNORMAL HIGH (ref 70–99)
Glucose-Capillary: 246 mg/dL — ABNORMAL HIGH (ref 70–99)

## 2011-10-26 LAB — BASIC METABOLIC PANEL
CO2: 27 mEq/L (ref 19–32)
Calcium: 10 mg/dL (ref 8.4–10.5)
Chloride: 98 mEq/L (ref 96–112)
Creatinine, Ser: 1.24 mg/dL (ref 0.50–1.35)
Glucose, Bld: 248 mg/dL — ABNORMAL HIGH (ref 70–99)
Sodium: 135 mEq/L (ref 135–145)

## 2011-10-26 MED ORDER — CARVEDILOL 3.125 MG PO TABS
3.1250 mg | ORAL_TABLET | Freq: Two times a day (BID) | ORAL | Status: DC
  Filled 2011-10-26 (×5): qty 1

## 2011-10-26 MED ORDER — PREDNISONE 50 MG PO TABS
60.0000 mg | ORAL_TABLET | Freq: Every day | ORAL | Status: DC
  Administered 2011-10-27 – 2011-10-28 (×2): 60 mg via ORAL
  Filled 2011-10-26 (×3): qty 1

## 2011-10-26 MED ORDER — CARVEDILOL 6.25 MG PO TABS
6.2500 mg | ORAL_TABLET | Freq: Two times a day (BID) | ORAL | Status: DC
  Administered 2011-10-26: 6.25 mg via ORAL
  Filled 2011-10-26 (×4): qty 1

## 2011-10-26 NOTE — Progress Notes (Signed)
Pt with HR sustaining in 40's (a-fib), HR also occasionally going as low as 35. Pt sleeping. HR upon awakening remains 40-50s. VSS. Pt stable. Lenny Pastel made aware. No new orders. Will monitor pt. Telemetry strip in chart

## 2011-10-26 NOTE — Progress Notes (Signed)
Patient faxed to [email or web address]. FL2 completed and placed on chart. Psychosocial and placement note placed on chart.  Carlos Morton MSW, LCSW [phone]

## 2011-10-26 NOTE — Evaluation (Signed)
Physical Therapy Evaluation One time eval and D/C from acute PT Patient Details Name: Carlos Morton MRN: [id number] DOB: 08-13-1916 Today's Date: 10/26/2011 Time: 5784-6962 PT Time Calculation (min): 14 min  PT Assessment / Plan / Recommendation Clinical Impression  Pt admitted for DM and CHF exacerbation.  Pt reported no SOB during evaluation.  Pt currently at baseline modified independent level.  Pt reports he has had no falls and seems very aware of his safety with transfers and gait. No f/u recommendations.    PT Assessment  Patent does not need any further PT services    Follow Up Recommendations  No PT follow up    Equipment Recommendations  None recommended by PT    Frequency      Precautions / Restrictions Precautions Precautions: Fall   Pertinent Vitals/Pain Post gait: SaO2 96% room air and HR 50      Mobility  Bed Mobility Bed Mobility: Not assessed Transfers Transfers: Sit to Stand;Stand to Sit Sit to Stand: 6: Modified independent (Device/Increase time) Stand to Sit: 6: Modified independent (Device/Increase time) Details for Transfer Assistance: pt performs sit to stands safely with use of armrests and keeping RW until back up to chair  Ambulation/Gait Ambulation/Gait Assistance: 6: Modified independent (Device/Increase time) Ambulation Distance (Feet): 240 Feet Ambulation/Gait Assistance Details: no LOB or unsteadiness, SaO2 98% room air pregait and 96% postgait, RN notified of SaO2 Gait Pattern: Step-through pattern;Decreased stride length    Exercises     PT Goals    Visit Information  Last PT Received On: 10/26/11 Assistance Needed: +1    Subjective Data  Subjective: "yes, I'll work with you then get cleaned up"   Prior Functioning  Home Living Type of Home: Independent living facility Home Adaptive Equipment: Straight cane;Walker - rolling Prior Function Level of Independence: Independent with assistive  device(s) Communication Communication: Other (comment) (Pt reports he is legally blind.)    Cognition  Overall Cognitive Status: Appears within functional limits for tasks assessed/performed Arousal/Alertness: Awake/alert Orientation Level: Appears intact for tasks assessed Behavior During Session: [facility] for tasks performed    Extremity/Trunk Assessment Right Upper Extremity Assessment RUE ROM/Strength/Tone: [facility] for tasks assessed Left Upper Extremity Assessment LUE ROM/Strength/Tone: [facility] for tasks assessed Right Lower Extremity Assessment RLE ROM/Strength/Tone: [facility] for tasks assessed Left Lower Extremity Assessment LLE ROM/Strength/Tone: [facility] for tasks assessed   Balance    End of Session PT - End of Session Equipment Utilized During Treatment: Gait belt Activity Tolerance: Patient tolerated treatment well Patient left: in chair;with call bell/phone within reach   [facility] E 10/26/2011, 11:34 AM Pager: [phone]

## 2011-10-26 NOTE — Progress Notes (Signed)
PT. HR DROPS INTO THE MID. 40'S TO HIGH 50'S, PT. IS ASYPOTMATIC.  NOTIFIED DR. MATTHEWS AND STATED TO CONTINUE WITH THE COREG, DESPITE LOW HR.  WILL CONTINUE TO MONITOR AND OBSERVE PT.

## 2011-10-26 NOTE — Progress Notes (Signed)
   CARE MANAGEMENT NOTE 10/26/2011  Patient:  ANHAD, SHEELEY   Account Number:  [id number]  Date Initiated:  10/22/2011  Documentation initiated by:  Lanier Clam  Subjective/Objective Assessment:   ADMITTED W/COUGH.HX: DM,COPD,CHF.     Action/Plan:   FROM [facility].   Anticipated DC Date:  10/29/2011   Anticipated DC Plan:  SKILLED NURSING FACILITY  In-house referral  Clinical Social Worker         Choice offered to / List presented to:             Status of service:  In process, will continue to follow Medicare Important Message given?   (If response is "NO", the following Medicare IM given date fields will be blank) Date Medicare IM given:   Date Additional Medicare IM given:    Discharge Disposition:    Per UR Regulation:  Reviewed for med. necessity/level of care/duration of stay  If discussed at Long Length of Stay Meetings, dates discussed:    Comments:  10/26/11 Raymona Boss RN,BSN NCM [phone] DIURESING.D/C PLAN SNF.CSW FOLLOWING.  10/23/11 Mirka Barbone RN,BSN NCM [phone] SPOKE TO PATIENT/SON IN LAW ABOUT D/C PLANS.HAS CANE,RW.FAMILY WILL BE ABLE TO TRANSPORT HOME WHEN MED STABLE.  10/22/11 Cari Burgo RN,BSN NCM [phone]

## 2011-10-26 NOTE — Progress Notes (Signed)
Inpatient Diabetes Program Recommendations  AACE/ADA: New Consensus Statement on Inpatient Glycemic Control (2009)  Target Ranges:  Prepandial:   less than 140 mg/dL      Peak postprandial:   less than 180 mg/dL (1-2 hours)      Critically ill patients:  140 - 180 mg/dL   Reason for Visit: Hyperglycemia on high dose steroid therapy.  Inpatient Diabetes Program Recommendations Insulin - Meal Coverage: While on Solumedrol, please add meal coverage of 4 units tidwc. Oral Agents: Not sure Amaryl can meet the needs of prandial glucose while on steroid therapy.  Novolog meal coverage may better meet the needs at this time.  Note: Thank you, Lenor Coffin, RN, CNS, Diabetes Coordinator [phone])

## 2011-10-26 NOTE — Progress Notes (Signed)
Subjective: Patient states that he feels much better today.   Interval history: Nursing reports the patient's heart rate into the mid 40s and 50s while patient asymptomatic.  Objective: Filed Vitals:   10/26/11 0429 10/26/11 1345 10/26/11 1414 10/26/11 1719  BP: 123/66  106/54 110/69  Pulse: 51  59 57  Temp: 97.4 F (36.3 C)  98.4 F (36.9 C)   TempSrc: Axillary  Oral   Resp: 18  18   Height:      Weight: 71.1 kg (156 lb 12 oz)     SpO2: 95% 94% 96% 95%   Weight change: 0.3 kg (10.6 oz)  Intake/Output Summary (Last 24 hours) at 10/26/11 1951 Last data filed at 10/26/11 1324  Gross per 24 hour  Intake    780 ml  Output    725 ml  Net     55 ml    General: Alert, awake, oriented x3, in no acute distress. Mildly anxious.  HEENT: [place]/AT [place], EOMI Neck: Trachea midline,  no masses, no thyromegal,y no JVD, no carotid bruit OROPHARYNX:  Moist, No exudate/ erythema/lesions.  Heart: Irregularly irregular. In atrial fibrillation with heart rate controlled. Lungs: Improved air entry throughout the lung fields, no wheezing or rhonchi noted.  Abdomen: Soft, nontender, nondistended, positive bowel sounds, no masses no hepatosplenomegaly noted. Psychiatric: Patient alert and oriented x3, mildly anxious.   Lab Results:  Basename 10/26/11 0439 10/25/11 0423 10/24/11 0437  NA 135 135 --  K 4.3 4.4 --  CL 98 99 --  CO2 27 27 --  GLUCOSE 248* 303* --  BUN 39* 31* --  CREATININE 1.24 1.31 --  CALCIUM 10.0 10.0 --  MG -- -- 2.0  PHOS -- -- --   No results found for this basename: AST:2,ALT:2,ALKPHOS:2,BILITOT:2,PROT:2,ALBUMIN:2 in the last 72 hours No results found for this basename: LIPASE:2,AMYLASE:2 in the last 72 hours No results found for this basename: WBC:2,NEUTROABS:2,HGB:2,HCT:2,MCV:2,PLT:2 in the last 72 hours No results found for this basename: CKTOTAL:3,CKMB:3,CKMBINDEX:3,TROPONINI:3 in the last 72 hours No components found with this basename: POCBNP:3 No results  found for this basename: DDIMER:2 in the last 72 hours No results found for this basename: HGBA1C:2 in the last 72 hours No results found for this basename: CHOL:2,HDL:2,LDLCALC:2,TRIG:2,CHOLHDL:2,LDLDIRECT:2 in the last 72 hours No results found for this basename: TSH,T4TOTAL,FREET3,T3FREE,THYROIDAB in the last 72 hours No results found for this basename: VITAMINB12:2,FOLATE:2,FERRITIN:2,TIBC:2,IRON:2,RETICCTPCT:2 in the last 72 hours  Micro Results: Recent Results (from the past 240 hour(s))  MRSA PCR SCREENING     Status: Normal   Collection Time   10/23/11  2:17 AM      Component Value Range Status Comment   MRSA by PCR NEGATIVE  NEGATIVE  Final     Studies/Results: Dg Chest 2 View  10/22/2011  *RADIOLOGY REPORT*  Clinical Data: Diabetes, shortness of breath, congestion  CHEST - 2 VIEW  Comparison: None.  Findings: The lungs are slightly hyperaerated suggesting a degree of COPD.  Minimally prominent interstitial markings are present at the lung bases most likely chronic.  Moderate cardiomegaly is present.  The bones are osteopenic.  There is a mild kyphoscoliosis present.  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  No definite active process. Moderate cardiomegaly.  Original Report Authenticated By: Juline Patch, M.D.    Medications: I have reviewed the patient's current medications. Scheduled Meds:    . aspirin EC  81 mg Oral Daily  . carvedilol  6.25 mg Oral BID WC  . clopidogrel  75 mg Oral  Daily  . furosemide  20 mg Intravenous TID  . glimepiride  2 mg Oral BID AC  . heparin  5,000 Units Subcutaneous Q8H  . insulin aspart  0-15 Units Subcutaneous TID WC  . insulin aspart  0-5 Units Subcutaneous QHS  . insulin glargine  10 Units Subcutaneous Daily  . isosorbide mononitrate  60 mg Oral Daily  . levothyroxine  25 mcg Oral QAC breakfast  . methylPREDNISolone (SOLU-MEDROL) injection  60 mg Intravenous Q6H  . pantoprazole  40 mg Oral Q0600  . potassium chloride  40 mEq  Oral BID  . predniSONE  60 mg Oral Q breakfast  . simvastatin  10 mg Oral q1800  . sodium chloride  3 mL Intravenous Q12H  . sodium chloride  3 mL Intravenous Q12H  . DISCONTD: carvedilol  12.5 mg Oral BID WC   Continuous Infusions:  PRN Meds:.sodium chloride, acetaminophen, acetaminophen, albuterol, ALPRAZolam, bisacodyl, ipratropium, magnesium hydroxide, sodium chloride, zolpidem Assessment/Plan: Patient Active Hospital Problem List: Bradycardia (10/26/2011)   Assessment: Patient having bradycardia on high dose of Coreg. The patient is asymptomatic at this time and has only one pause. I will decrease the Coreg to 3.125 mg by mouth twice a day. Continue to monitor on telemetry. 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    CHF exacerbation (10/22/2011)   Assessment: We'll continue diuresis with Lasix for now. I will cut the Lasix down to 60 mg per day in 3 divided doses versus 80 mg per day. Will continue on current dose of Lasix the patient is receiving steroids as his glans itself to fluid retention. Recheck a BNP tomorrow and make further decisions about transition into oral Lasix.    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 We'll transition to by mouth steroids starting on tomorrow.   Hypothyroidism (acquired) (10/22/2011)   Assessment: Thyroid was supplemented with TSH in normal range of 3.619    Atrial fibrillation with controlled ventricular response (10/22/2011)   Assessment: Patient with bradycardia. We'll decrease Coreg 3.125 mg by mouth twice a day       LOS: 4 days

## 2011-10-26 NOTE — Evaluation (Signed)
Occupational Therapy Evaluation Patient Details Name: Carlos Morton MRN: [id number] DOB: 05-24-1917 Today's Date: 10/26/2011 Time: 7846-9629 OT Time Calculation (min): 24 min  OT Assessment / Plan / Recommendation Clinical Impression  Pt is a 78 yo male who presents with complications due to DM. Feel pt would be able to return to I living apt w [facility] safety eval. Pt wants ST snf. Skilled OT recommended to maximize I w/BADLs to mod I level in prep for d/c home or to next venue of care.    OT Assessment  Patient needs continued OT Services    Follow Up Recommendations  Home health OT    Equipment Recommendations  None recommended by OT    Frequency Min 2X/week    Precautions / Restrictions Precautions Precautions: Fall   Pertinent Vitals/Pain     ADL  Grooming: Performed;Wash/dry hands;Supervision/safety Where Assessed - Grooming: Standing at sink Upper Body Bathing: Simulated;Set up Where Assessed - Upper Body Bathing: Sitting, bed;Unsupported Lower Body Bathing: Simulated;Moderate assistance Where Assessed - Lower Body Bathing: Sit to stand from bed Upper Body Dressing: Simulated;Set up Where Assessed - Upper Body Dressing: Sitting, bed;Unsupported Lower Body Dressing: Simulated;Moderate assistance Where Assessed - Lower Body Dressing: Sit to stand from bed Toilet Transfer: Supervision/safety Toilet Transfer Method: Proofreader: Regular height toilet;Grab bars Toileting - Clothing Manipulation: Performed;Minimal assistance Where Assessed - Toileting Clothing Manipulation: Sit to stand from 3-in-1 or toilet Toileting - Hygiene: Performed;Minimal assistance Where Assessed - Toileting Hygiene: Sit to stand from 3-in-1 or toilet Tub/Shower Transfer: Not assessed Tub/Shower Transfer Method: Not assessed Equipment Used: Rolling walker Ambulation Related to ADLs: Pt ambulated to bathroom w/RW. Fatigues quickly.    OT Goals Acute Rehab OT Goals OT Goal  Formulation: With patient Time For Goal Achievement: 11/09/11 Potential to Achieve Goals: Good ADL Goals Pt Will Perform Grooming: with modified independence;Standing at sink ADL Goal: Grooming - Progress: Goal set today Pt Will Perform Lower Body Bathing: with modified independence;Sit to stand from chair;Sit to stand from bed ADL Goal: Lower Body Bathing - Progress: Goal set today Pt Will Perform Lower Body Dressing: with modified independence;Sit to stand from chair;Sit to stand from bed ADL Goal: Lower Body Dressing - Progress: Goal set today Pt Will Transfer to Toilet: with modified independence;Ambulation;Regular height toilet ADL Goal: Toilet Transfer - Progress: Goal set today Pt Will Perform Toileting - Clothing Manipulation: with modified independence;Standing ADL Goal: Toileting - Clothing Manipulation - Progress: Goal set today Pt Will Perform Toileting - Hygiene: with modified independence;Sit to stand from 3-in-1/toilet ADL Goal: Toileting - Hygiene - Progress: Goal set today  Visit Information  Last OT Received On: 10/26/11 Assistance Needed: +1    Subjective Data  Subjective: "I've never fallin at home." Patient Stated Goal: "I think I need to go to rehab for a little while. Im very weak."   Prior Functioning  Home Living Lives With: Alone Available Help at Discharge: Family Type of Home: Independent living facility Home Access: Level entry Home Layout: One level Bathroom Shower/Tub: Health visitor: Handicapped height Home Adaptive Equipment: Walker - rolling;Straight cane;Grab bars in shower;Shower chair with back;Grab bars around toilet Prior Function Level of Independence: Independent with assistive device(s) Driving: No Communication Communication: No difficulties    Cognition  Overall Cognitive Status: Appears within functional limits for tasks assessed/performed Arousal/Alertness: Awake/alert Orientation Level: Appears intact for tasks  assessed Behavior During Session: [facility] for tasks performed    Extremity/Trunk Assessment Right Upper Extremity Assessment RUE ROM/Strength/Tone: Within  functional levels RUE Coordination: WFL - gross/fine motor Left Upper Extremity Assessment LUE ROM/Strength/Tone: Within functional levels LUE Coordination: WFL - gross/fine motor Right Lower Extremity Assessment RLE ROM/Strength/Tone: WFL for tasks assessed Left Lower Extremity Assessment LLE ROM/Strength/Tone: WFL for tasks assessed   Mobility Bed Mobility Bed Mobility: Supine to Sit Supine to Sit: 5: Supervision Transfers Sit to Stand: 5: Supervision Stand to Sit: 5: Supervision Details for Transfer Assistance: Pt demos safe manipulation of RW.   Exercise    Balance    End of Session OT - End of Session Equipment Utilized During Treatment: Gait belt Activity Tolerance: Patient tolerated treatment well Patient left: in chair;with call bell/phone within reach   Ivelisse Culverhouse A OTR/L 10/26/2011, 1:19 PM

## 2011-10-26 NOTE — Progress Notes (Signed)
Pt. Hr is in the the low 50's this am, DR. Matthews notified, will hold am dose of Coreg.  Will continue to monitor and observe pt.

## 2011-10-27 DIAGNOSIS — R001 Bradycardia, unspecified: Secondary | ICD-10-CM | POA: Diagnosis present

## 2011-10-27 LAB — GLUCOSE, CAPILLARY: Glucose-Capillary: 281 mg/dL — ABNORMAL HIGH (ref 70–99)

## 2011-10-27 LAB — BASIC METABOLIC PANEL
BUN: 48 mg/dL — ABNORMAL HIGH (ref 6–23)
CO2: 26 mEq/L (ref 19–32)
Calcium: 9.8 mg/dL (ref 8.4–10.5)
Creatinine, Ser: 1.08 mg/dL (ref 0.50–1.35)
Glucose, Bld: 265 mg/dL — ABNORMAL HIGH (ref 70–99)

## 2011-10-27 LAB — MAGNESIUM: Magnesium: 2.4 mg/dL (ref 1.5–2.5)

## 2011-10-27 MED ORDER — FUROSEMIDE 10 MG/ML IJ SOLN
40.0000 mg | Freq: Three times a day (TID) | INTRAMUSCULAR | Status: DC
  Administered 2011-10-27: 40 mg via INTRAVENOUS
  Filled 2011-10-27 (×5): qty 4

## 2011-10-27 MED ORDER — FUROSEMIDE 40 MG PO TABS
40.0000 mg | ORAL_TABLET | Freq: Three times a day (TID) | ORAL | Status: DC
  Administered 2011-10-27 – 2011-10-28 (×2): 40 mg via ORAL
  Filled 2011-10-27 (×4): qty 1

## 2011-10-27 NOTE — Progress Notes (Signed)
CSW spoke with patients daughter, requesting [facility]. [facility] accepted. CSW to follow for discharge.  Kam Kushnir C. Lionel Woodberry MSW, LCSW [phone]

## 2011-10-27 NOTE — Progress Notes (Signed)
Subjective: Patient is up ambulating in the hallway with his daughter on a walker. He has not required any oxygen while ambulating and states that he feels great.  Interval history: The patient has continued to have bradycardia despite decreasing and then stopping the Coreg.  Objective: Filed Vitals:   10/26/11 2058 10/27/11 0430 10/27/11 0815 10/27/11 1323  BP: 104/60 139/62 113/65 114/61  Pulse: 47 48 52 47  Temp: 98.1 F (36.7 C) 96.9 F (36.1 C)  97.9 F (36.6 C)  TempSrc: Oral Oral  Oral  Resp: 18 18  18   Height:      Weight:  71.124 kg (156 lb 12.8 oz)    SpO2: 92% 96%  96%   Weight change: 0.024 kg (0.9 oz)  Intake/Output Summary (Last 24 hours) at 10/27/11 1932 Last data filed at 10/27/11 1700  Gross per 24 hour  Intake    180 ml  Output    100 ml  Net     80 ml    General: Alert, awake, oriented x3, in no acute distress. Mildly anxious.  HEENT: [place]/AT PEERL, EOMI Neck: Trachea midline,  no masses, no thyromegal,y no JVD, no carotid bruit OROPHARYNX:  Moist, No exudate/ erythema/lesions.  Heart: Irregularly irregular. In atrial fibrillation with heart rate controlled. Lungs: Improved air entry throughout the lung fields, no wheezing or rhonchi noted.  Abdomen: Soft, nontender, nondistended, positive bowel sounds, no masses no hepatosplenomegaly noted. Psychiatric: Patient alert and oriented x3, mildly anxious.   Lab Results:  Basename 10/27/11 0503 10/26/11 0439  NA 133* 135  K 4.4 4.3  CL 99 98  CO2 26 27  GLUCOSE 265* 248*  BUN 48* 39*  CREATININE 1.08 1.24  CALCIUM 9.8 10.0  MG 2.4 --  PHOS -- --   No results found for this basename: AST:2,ALT:2,ALKPHOS:2,BILITOT:2,PROT:2,ALBUMIN:2 in the last 72 hours No results found for this basename: LIPASE:2,AMYLASE:2 in the last 72 hours No results found for this basename: WBC:2,NEUTROABS:2,HGB:2,HCT:2,MCV:2,PLT:2 in the last 72 hours No results found for this basename: CKTOTAL:3,CKMB:3,CKMBINDEX:3,TROPONINI:3  in the last 72 hours No components found with this basename: POCBNP:3 No results found for this basename: DDIMER:2 in the last 72 hours No results found for this basename: HGBA1C:2 in the last 72 hours No results found for this basename: CHOL:2,HDL:2,LDLCALC:2,TRIG:2,CHOLHDL:2,LDLDIRECT:2 in the last 72 hours No results found for this basename: TSH,T4TOTAL,FREET3,T3FREE,THYROIDAB in the last 72 hours No results found for this basename: VITAMINB12:2,FOLATE:2,FERRITIN:2,TIBC:2,IRON:2,RETICCTPCT:2 in the last 72 hours  Micro Results: Recent Results (from the past 240 hour(s))  MRSA PCR SCREENING     Status: Normal   Collection Time   10/23/11  2:17 AM      Component Value Range Status Comment   MRSA by PCR NEGATIVE  NEGATIVE  Final     Studies/Results: Dg Chest 2 View  10/22/2011  *RADIOLOGY REPORT*  Clinical Data: Diabetes, shortness of breath, congestion  CHEST - 2 VIEW  Comparison: None.  Findings: The lungs are slightly hyperaerated suggesting a degree of COPD.  Minimally prominent interstitial markings are present at the lung bases most likely chronic.  Moderate cardiomegaly is present.  The bones are osteopenic.  There is a mild kyphoscoliosis present.  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  No definite active process. Moderate cardiomegaly.  Original Report Authenticated By: Juline Patch, M.D.    Medications: I have reviewed the patient's current medications. Scheduled Meds:    . aspirin EC  81 mg Oral Daily  . carvedilol  3.125 mg Oral BID  WC  . clopidogrel  75 mg Oral Daily  . furosemide  40 mg Intravenous TID  . glimepiride  2 mg Oral BID AC  . heparin  5,000 Units Subcutaneous Q8H  . insulin aspart  0-15 Units Subcutaneous TID WC  . insulin aspart  0-5 Units Subcutaneous QHS  . insulin glargine  10 Units Subcutaneous Daily  . isosorbide mononitrate  60 mg Oral Daily  . levothyroxine  25 mcg Oral QAC breakfast  . methylPREDNISolone (SOLU-MEDROL) injection  60 mg  Intravenous Q6H  . pantoprazole  40 mg Oral Q0600  . potassium chloride  40 mEq Oral BID  . predniSONE  60 mg Oral Q breakfast  . simvastatin  10 mg Oral q1800  . sodium chloride  3 mL Intravenous Q12H  . sodium chloride  3 mL Intravenous Q12H  . DISCONTD: carvedilol  6.25 mg Oral BID WC  . DISCONTD: furosemide  20 mg Intravenous TID   Continuous Infusions:  PRN Meds:.sodium chloride, acetaminophen, acetaminophen, albuterol, ALPRAZolam, bisacodyl, ipratropium, magnesium hydroxide, sodium chloride, zolpidem Assessment/Plan: Patient Active Hospital Problem List: Bradycardia (10/26/2011)   Assessment: Despite decrease in the Coreg 3.125 the patient is still having significant bradycardia. Thus I discontinued the Coreg at this time. I've ordered a 2-D echocardiogram and plan to consult cardiology to see the patient regarding the bradycardia.   Diabetes mellitus type 2 with complications (10/22/2011)   Assessment: Blood sugars elevated resultant from  Steroids.I have added Lantus for better basal coverage while the patient is taking steroids.    CHF exacerbation (10/22/2011)   Assessment: The patient has had a mild increase in his weight and his BNP however appears clinically compensated. I've increased his Lasix minimally as his creatinine and BUN appear to be well tolerated. 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 He is on day #5 of steroids.   Hypothyroidism (acquired) (10/22/2011)   Assessment: Thyroid was supplemented with TSH in normal range of 3.619    Atrial fibrillation with controlled ventricular response (10/22/2011)   Assessment: Patient with bradycardia. We'll hold Coreg       LOS: 5 days

## 2011-10-28 DIAGNOSIS — I059 Rheumatic mitral valve disease, unspecified: Secondary | ICD-10-CM

## 2011-10-28 LAB — CBC
HCT: 42 % (ref 39.0–52.0)
Hemoglobin: 14.1 g/dL (ref 13.0–17.0)
MCHC: 33.6 g/dL (ref 30.0–36.0)
RBC: 4.93 MIL/uL (ref 4.22–5.81)
WBC: 16.2 10*3/uL — ABNORMAL HIGH (ref 4.0–10.5)

## 2011-10-28 LAB — DIFFERENTIAL
Lymphocytes Relative: 9 % — ABNORMAL LOW (ref 12–46)
Lymphs Abs: 1.5 10*3/uL (ref 0.7–4.0)
Monocytes Absolute: 1.1 10*3/uL — ABNORMAL HIGH (ref 0.1–1.0)
Monocytes Relative: 7 % (ref 3–12)
Neutro Abs: 13.7 10*3/uL — ABNORMAL HIGH (ref 1.7–7.7)
Neutrophils Relative %: 84 % — ABNORMAL HIGH (ref 43–77)

## 2011-10-28 LAB — GLUCOSE, CAPILLARY
Glucose-Capillary: 174 mg/dL — ABNORMAL HIGH (ref 70–99)
Glucose-Capillary: 221 mg/dL — ABNORMAL HIGH (ref 70–99)

## 2011-10-28 LAB — BASIC METABOLIC PANEL
BUN: 48 mg/dL — ABNORMAL HIGH (ref 6–23)
Chloride: 99 mEq/L (ref 96–112)
GFR calc Af Amer: 68 mL/min — ABNORMAL LOW (ref >90)
Potassium: 4.4 mEq/L (ref 3.5–5.1)
Sodium: 137 mEq/L (ref 135–145)

## 2011-10-28 MED ORDER — INSULIN GLARGINE 100 UNIT/ML ~~LOC~~ SOLN: 10.0000 [IU] | Freq: Every day | SUBCUTANEOUS | Status: DC

## 2011-10-28 MED ORDER — FUROSEMIDE 40 MG PO TABS: 40.0000 mg | ORAL_TABLET | Freq: Two times a day (BID) | ORAL | Status: DC

## 2011-10-28 MED ORDER — PREDNISONE 20 MG PO TABS: 60.0000 mg | ORAL_TABLET | Freq: Every day | ORAL | Status: AC

## 2011-10-28 MED ORDER — ALBUTEROL SULFATE (5 MG/ML) 0.5% IN NEBU: 2.5000 mg | INHALATION_SOLUTION | RESPIRATORY_TRACT | Status: DC | PRN

## 2011-10-28 MED ORDER — IPRATROPIUM BROMIDE 0.02 % IN SOLN: 0.5000 mg | Freq: Four times a day (QID) | RESPIRATORY_TRACT | Status: DC | PRN

## 2011-10-28 NOTE — Progress Notes (Signed)
Pt discharged to [facility]. Pt transported by EMS, pt had no complaints of pain at the time of discharge.

## 2011-10-28 NOTE — Progress Notes (Signed)
   CARE MANAGEMENT NOTE 10/28/2011  Patient:  Carlos Morton, Carlos Morton   Account Number:  [id number]  Date Initiated:  10/22/2011  Documentation initiated by:  Lanier Clam  Subjective/Objective Assessment:   ADMITTED W/COUGH.HX: DM,COPD,CHF.     Action/Plan:   FROM [facility].   Anticipated DC Date:  10/29/2011   Anticipated DC Plan:  SKILLED NURSING FACILITY  In-house referral  Clinical Social Worker         Choice offered to / List presented to:             Status of service:  In process, will continue to follow Medicare Important Message given?   (If response is "NO", the following Medicare IM given date fields will be blank) Date Medicare IM given:   Date Additional Medicare IM given:    Discharge Disposition:    Per UR Regulation:  Reviewed for med. necessity/level of care/duration of stay  If discussed at Long Length of Stay Meetings, dates discussed:   10/28/2011    Comments:  10/26/11 Hiroshi Krummel RN,BSN NCM [phone] DI[facility]ING.D/C PLAN SNF.CSW FOLLOWING.  10/23/11 Eduard Penkala RN,BSN NCM [phone] SPOKE TO PATIENT/SON IN LAW ABOUT D/C PLANS.HAS CANE,RW.FAMILY WILL BE ABLE TO TRANSPORT HOME WHEN MED STABLE.  10/22/11 Markiyah Gahm RN,BSN NCM [phone]

## 2011-10-28 NOTE — Progress Notes (Signed)
  Echocardiogram 2D Echocardiogram has been performed.  Emelia Loron A 10/28/2011, 12:29 PM

## 2011-10-28 NOTE — Progress Notes (Signed)
Patient cleared for discharge. Patient accepted at [facility]. Patients daughter agreeable to transfer. Packet copied and placed in [place]. ptar called for transportation.  Jalynn Waddell C. Solon Alban MSW, LCSW [phone]

## 2011-10-28 NOTE — Discharge Summary (Signed)
Physician Discharge Summary  Patient ID: Carlos Morton MRN: [id number] DOB/AGE: 76-17-1918 76 y.o.  Admit date: 10/22/2011 Discharge date: 10/28/2011  Primary Care Physician:  Karle Plumber, MD, MD   Discharge Diagnoses:    Active Problems:  Diabetes mellitus type 2 with complications  Possible CHF exacerbation, type unknown, 2D ECHO pending  COPD bronchitis  Hypothyroidism (acquired)  Atrial fibrillation with controlled ventricular response  Bradycardia, off coreg     Medication List  As of 10/28/2011  1:34 PM   STOP taking these medications         carvedilol 12.5 MG tablet      levalbuterol 45 MCG/ACT inhaler      levofloxacin 500 MG tablet         TAKE these medications         albuterol (5 MG/ML) 0.5% nebulizer solution   Commonly known as: PROVENTIL   Take 0.5 mLs (2.5 mg total) by nebulization every 4 (four) hours as needed for wheezing or shortness of breath.      aspirin EC 81 MG tablet   Take 81 mg by mouth daily.      bisacodyl 5 MG EC tablet   Generic drug: bisacodyl   Take 5 mg by mouth daily as needed. For stool softener      clopidogrel 75 MG tablet   Commonly known as: PLAVIX   Take 75 mg by mouth daily.      furosemide 40 MG tablet   Commonly known as: LASIX   Take 1 tablet (40 mg total) by mouth 2 (two) times daily.      glimepiride 2 MG tablet   Commonly known as: AMARYL   Take 2 mg by mouth 2 (two) times daily before a meal.      insulin glargine 100 UNIT/ML injection   Commonly known as: LANTUS   Inject 10 Units into the skin daily.      ipratropium 0.02 % nebulizer solution   Commonly known as: ATROVENT   Take 2.5 mLs (0.5 mg total) by nebulization every 6 (six) hours as needed.      isosorbide mononitrate 60 MG 24 hr tablet   Commonly known as: IMDUR   Take 60 mg by mouth daily.      levothyroxine 25 MCG tablet   Commonly known as: SYNTHROID, LEVOTHROID   Take 25 mcg by mouth daily.      lovastatin 20 MG tablet   Commonly known as: MEVACOR   Take 20 mg by mouth at bedtime.      magnesium hydroxide 400 MG/5ML suspension   Commonly known as: MILK OF MAGNESIA   Take 15 mLs by mouth daily as needed. For laxative      potassium chloride SA 20 MEQ tablet   Commonly known as: K-DUR,KLOR-CON   Take 20 mEq by mouth daily.      predniSONE 20 MG tablet   Commonly known as: DELTASONE   Take 3 tablets (60 mg total) by mouth daily with breakfast. Decrease by 10 mg daily until off.      zolpidem 10 MG tablet   Commonly known as: AMBIEN   Take 10 mg by mouth at bedtime as needed. For sleep             Disposition and Follow-up:  Will be discharged to SNF in stable and improved condition. Facility will need to followup on 2D ECHO results and follow HR to ensure it remains stable off of coreg.  Consults:  None  Significant Diagnostic Studies:  Dg Chest 2 View  10/22/2011  *RADIOLOGY REPORT*  Clinical Data: Diabetes, shortness of breath, congestion  CHEST - 2 VIEW  Comparison: None.  Findings: The lungs are slightly hyperaerated suggesting a degree of COPD.  Minimally prominent interstitial markings are present at the lung bases most likely chronic.  Moderate cardiomegaly is present.  The bones are osteopenic.  There is a mild kyphoscoliosis present.  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  No definite active process. Moderate cardiomegaly.  Original Report Authenticated By: Juline Patch, M.D.   Brief H and P: For complete details please refer to admission H and P, but in brief patient is a elderly gentleman who has been having shortness of breath and dyspnea on exertion for several weeks. He was seen by his primary care physician and treated for pneumonia. He was also given a double shot of antibiotics and steroids proximately 4 days ago and transitioned to Levaquin 750 mg to take a total of 7 days. The patient is currently on day #3 of Levaquin. According to the patient's daughter he had only a  very low-grade temperature of 99.0 however never had any fever or chills. He has been coughing which has been productive of whitish sputum. The patient denies any orthopnea or PND. He states that he has chronic pedal edema which usually receives at night when he puts his legs up. He has not noted any change in the pedal edema. The patient does not check his weight daily and is unable to tell me he is having significant leaking. However from the field the skull to does not appear he has had any weight gain. The patient's daughter also reports that she has noted that he's been wheezing at night and states that he received an inhaler from his primary care physician on Monday I will she's been using about 3 times a day. Despite all these interventions the patient does not appear to be making any progress in recovering and his feeling of weakness and shortness of breath appears to be getting worse as they came to the emergency room today for further evaluation and management. We were asked to admit him for further evaluation and management.     Hospital Course:  Active Problems:  Diabetes mellitus type 2 with complications  CHF exacerbation  COPD bronchitis  Hypothyroidism (acquired)  Atrial fibrillation with controlled ventricular response  Bradycardia   #1 SOB: Resolved. Believed multifactorial 2/2 COPD exacerbation +/- CHF Exacerbation. Please see below for details. Not requiring oxygen.  #2 COPD with acute exacerbation: resolved. Continue PRN nebs and steroid taper.  #3 Possible CHF, type unknown: 2D ECHO ordered and results are pending. Continue lasix at increased dose. Diagnosis of CHF was made on CXR showing cardiomegaly and an elevated pro-BNP. If does have CHF, then may need an ACE-I/ARB for core measures: will defer this to outpatient provider. Renal function stable at 1.0, BP has been marginal at around 100-110s, so an ACE-I may not even be an option.  #4 Sinus Bradycardia: Improving off  coreg. HR now in the mid-50s. Continue to monitor. Would not use BB for a fib in the future.  #5 Dispo: Medically ready for DC SNF today.  Time spent on Discharge: Greater than 30 minutes.  SignedChaya Jan [facility] Pager: [phone] 10/28/2011, 1:34 PM

## 2011-10-28 NOTE — Progress Notes (Signed)
Occupational Therapy Treatment Patient Details Name: Sevyn Paredez MRN: [id number] DOB: 1916-08-19 Today's Date: 10/28/2011 Time: 1324-4010 OT Time Calculation (min): 13 min  OT Assessment / Plan / Recommendation Comments on Treatment Session Plan is for pt to d/c to snf.    Follow Up Recommendations       Equipment Recommendations  None recommended by OT    Frequency Min 2X/week   Plan Discharge plan remains appropriate    Precautions / Restrictions Precautions Precautions: Fall   Pertinent Vitals/Pain     ADL  Grooming: Performed;Wash/dry hands;Supervision/safety Where Assessed - Grooming: Standing at sink Lower Body Bathing: Performed;Moderate assistance Where Assessed - Lower Body Bathing: Sit to stand from chair Upper Body Dressing: Performed;Set up Where Assessed - Upper Body Dressing: Standing Toilet Transfer: Performed;Minimal assistance Toilet Transfer Method: Ambulating Toilet Transfer Equipment: Regular height toilet;Grab bars (Vc to use grab bar.) Toileting - Clothing Manipulation: Performed;Minimal assistance Where Assessed - Toileting Clothing Manipulation: Sit to stand from 3-in-1 or toilet    OT Goals ADL Goals ADL Goal: Grooming - Progress: Progressing toward goals ADL Goal: Lower Body Bathing - Progress: Progressing toward goals ADL Goal: Toilet Transfer - Progress: Not progressing ADL Goal: Toileting - Clothing Manipulation - Progress: Progressing toward goals ADL Goal: Toileting - Hygiene - Progress: Progressing toward goals  Visit Information  Last OT Received On: 10/28/11 Assistance Needed: +1    Subjective Data  Subjective: "I havent been out of this bed since yesterday."   Prior Functioning       Cognition  Overall Cognitive Status: Appears within functional limits for tasks assessed/performed Arousal/Alertness: Awake/alert Orientation Level: Appears intact for tasks assessed Behavior During Session: [facility] for tasks performed      Mobility Bed Mobility Supine to Sit: 5: Supervision;With rails;HOB flat Transfers Sit to Stand: 4: Min guard;With upper extremity assist;From bed;From chair/3-in-1;From toilet Stand to Sit: 5: Supervision;With upper extremity assist;To chair/3-in-1;To toilet;With armrests Details for Transfer Assistance: Vcs for hand placement and maneuvering RW around room due to low vision.   Exercises    Balance    End of Session OT - End of Session Activity Tolerance: Patient tolerated treatment well Patient left: in chair;with call bell/phone within reach;with chair alarm set   Mariselda Badalamenti A OTR/L [phone] 10/28/2011, 8:53 AM

## 2013-06-21 ENCOUNTER — Inpatient Hospital Stay (HOSPITAL_COMMUNITY)
Admission: EM | Admit: 2013-06-21 | Discharge: 2013-06-23 | DRG: 293 | Disposition: A | Discharge status: Nursing Home (Includes ICF) | Payer: Medicare Other | Attending: Internal Medicine | Admitting: Internal Medicine

## 2013-06-21 ENCOUNTER — Emergency Department (HOSPITAL_COMMUNITY): Payer: Medicare Other

## 2013-06-21 ENCOUNTER — Encounter (HOSPITAL_COMMUNITY): Payer: Self-pay | Admitting: Emergency Medicine

## 2013-06-21 DIAGNOSIS — E785 Hyperlipidemia, unspecified: Secondary | ICD-10-CM | POA: Diagnosis present

## 2013-06-21 DIAGNOSIS — J449 Chronic obstructive pulmonary disease, unspecified: Secondary | ICD-10-CM | POA: Diagnosis present

## 2013-06-21 DIAGNOSIS — R531 Weakness: Secondary | ICD-10-CM

## 2013-06-21 DIAGNOSIS — I509 Heart failure, unspecified: Secondary | ICD-10-CM | POA: Diagnosis present

## 2013-06-21 DIAGNOSIS — IMO0001 Reserved for inherently not codable concepts without codable children: Secondary | ICD-10-CM | POA: Diagnosis present

## 2013-06-21 DIAGNOSIS — R062 Wheezing: Secondary | ICD-10-CM

## 2013-06-21 DIAGNOSIS — Z79899 Other long term (current) drug therapy: Secondary | ICD-10-CM

## 2013-06-21 DIAGNOSIS — E7800 Pure hypercholesterolemia, unspecified: Secondary | ICD-10-CM | POA: Diagnosis present

## 2013-06-21 DIAGNOSIS — R0600 Dyspnea, unspecified: Secondary | ICD-10-CM

## 2013-06-21 DIAGNOSIS — I5023 Acute on chronic systolic (congestive) heart failure: Secondary | ICD-10-CM

## 2013-06-21 DIAGNOSIS — E039 Hypothyroidism, unspecified: Secondary | ICD-10-CM | POA: Diagnosis present

## 2013-06-21 DIAGNOSIS — I2510 Atherosclerotic heart disease of native coronary artery without angina pectoris: Secondary | ICD-10-CM | POA: Diagnosis present

## 2013-06-21 DIAGNOSIS — Z794 Long term (current) use of insulin: Secondary | ICD-10-CM

## 2013-06-21 DIAGNOSIS — Z9861 Coronary angioplasty status: Secondary | ICD-10-CM

## 2013-06-21 DIAGNOSIS — I059 Rheumatic mitral valve disease, unspecified: Secondary | ICD-10-CM | POA: Diagnosis present

## 2013-06-21 DIAGNOSIS — Z87891 Personal history of nicotine dependence: Secondary | ICD-10-CM

## 2013-06-21 DIAGNOSIS — I10 Essential (primary) hypertension: Secondary | ICD-10-CM | POA: Diagnosis present

## 2013-06-21 DIAGNOSIS — E119 Type 2 diabetes mellitus without complications: Secondary | ICD-10-CM | POA: Diagnosis present

## 2013-06-21 DIAGNOSIS — E118 Type 2 diabetes mellitus with unspecified complications: Secondary | ICD-10-CM

## 2013-06-21 DIAGNOSIS — Z96659 Presence of unspecified artificial knee joint: Secondary | ICD-10-CM

## 2013-06-21 DIAGNOSIS — J4489 Other specified chronic obstructive pulmonary disease: Secondary | ICD-10-CM | POA: Diagnosis present

## 2013-06-21 DIAGNOSIS — I4891 Unspecified atrial fibrillation: Secondary | ICD-10-CM | POA: Diagnosis present

## 2013-06-21 DIAGNOSIS — Z880 Allergy status to penicillin: Secondary | ICD-10-CM

## 2013-06-21 LAB — COMPREHENSIVE METABOLIC PANEL
ALT: 8 U/L (ref 0–53)
AST: 16 U/L (ref 0–37)
Albumin: 3.9 g/dL (ref 3.5–5.2)
Calcium: 9.2 mg/dL (ref 8.4–10.5)
GFR calc Af Amer: 71 mL/min — ABNORMAL LOW (ref >90)
Potassium: 4.3 mEq/L (ref 3.5–5.1)
Sodium: 134 mEq/L — ABNORMAL LOW (ref 135–145)
Total Protein: 7.1 g/dL (ref 6.0–8.3)

## 2013-06-21 LAB — URINALYSIS, ROUTINE W REFLEX MICROSCOPIC
Bilirubin Urine: NEGATIVE
Glucose, UA: NEGATIVE mg/dL
Hgb urine dipstick: NEGATIVE
Leukocytes, UA: NEGATIVE
Protein, ur: NEGATIVE mg/dL
Specific Gravity, Urine: 1.011 (ref 1.005–1.030)
pH: 6.5 (ref 5.0–8.0)

## 2013-06-21 LAB — CBC WITH DIFFERENTIAL/PLATELET
Basophils Absolute: 0.1 10*3/uL (ref 0.0–0.1)
Basophils Relative: 0 % (ref 0–1)
Eosinophils Absolute: 0.3 10*3/uL (ref 0.0–0.7)
Eosinophils Relative: 2 % (ref 0–5)
Hemoglobin: 13 g/dL (ref 13.0–17.0)
MCH: 28 pg (ref 26.0–34.0)
MCV: 84.5 fL (ref 78.0–100.0)
Monocytes Relative: 7 % (ref 3–12)
Neutrophils Relative %: 74 % (ref 43–77)
Platelets: 179 10*3/uL (ref 150–400)
RBC: 4.65 MIL/uL (ref 4.22–5.81)
RDW: 14.3 % (ref 11.5–15.5)

## 2013-06-21 LAB — PRO B NATRIURETIC PEPTIDE: Pro B Natriuretic peptide (BNP): 2179 pg/mL — ABNORMAL HIGH (ref 0–450)

## 2013-06-21 MED ORDER — GLIMEPIRIDE 2 MG PO TABS
2.0000 mg | ORAL_TABLET | Freq: Two times a day (BID) | ORAL | Status: DC
  Filled 2013-06-21: qty 1

## 2013-06-21 MED ORDER — LEVALBUTEROL HCL 0.63 MG/3ML IN NEBU: 0.6300 mg | INHALATION_SOLUTION | Freq: Four times a day (QID) | RESPIRATORY_TRACT | Status: DC | PRN

## 2013-06-21 MED ORDER — ACETAMINOPHEN 650 MG RE SUPP: 650.0000 mg | Freq: Four times a day (QID) | RECTAL | Status: DC | PRN

## 2013-06-21 MED ORDER — LEVALBUTEROL HCL 0.63 MG/3ML IN NEBU
0.6300 mg | INHALATION_SOLUTION | Freq: Four times a day (QID) | RESPIRATORY_TRACT | Status: DC
  Administered 2013-06-22 (×4): 0.63 mg via RESPIRATORY_TRACT
  Filled 2013-06-21 (×7): qty 3

## 2013-06-21 MED ORDER — CLOPIDOGREL BISULFATE 75 MG PO TABS
75.0000 mg | ORAL_TABLET | Freq: Every day | ORAL | Status: DC
  Administered 2013-06-22 – 2013-06-23 (×2): 75 mg via ORAL
  Filled 2013-06-21 (×4): qty 1

## 2013-06-21 MED ORDER — BUDESONIDE 0.25 MG/2ML IN SUSP
0.2500 mg | Freq: Two times a day (BID) | RESPIRATORY_TRACT | Status: DC
  Administered 2013-06-21 – 2013-06-23 (×4): 0.25 mg via RESPIRATORY_TRACT
  Filled 2013-06-21 (×8): qty 2

## 2013-06-21 MED ORDER — GLIMEPIRIDE 2 MG PO TABS
2.0000 mg | ORAL_TABLET | Freq: Two times a day (BID) | ORAL | Status: DC
  Administered 2013-06-21 – 2013-06-23 (×4): 2 mg via ORAL
  Filled 2013-06-21 (×6): qty 1

## 2013-06-21 MED ORDER — ENOXAPARIN SODIUM 40 MG/0.4ML ~~LOC~~ SOLN
40.0000 mg | SUBCUTANEOUS | Status: DC
  Administered 2013-06-21 – 2013-06-22 (×2): 40 mg via SUBCUTANEOUS
  Filled 2013-06-21 (×3): qty 0.4

## 2013-06-21 MED ORDER — SODIUM CHLORIDE 0.9 % IJ SOLN
3.0000 mL | Freq: Two times a day (BID) | INTRAMUSCULAR | Status: DC
  Administered 2013-06-21 – 2013-06-22 (×3): 3 mL via INTRAVENOUS

## 2013-06-21 MED ORDER — SIMVASTATIN 10 MG PO TABS
10.0000 mg | ORAL_TABLET | Freq: Every day | ORAL | Status: DC
  Administered 2013-06-22: 18:00:00 10 mg via ORAL
  Filled 2013-06-21 (×2): qty 1

## 2013-06-21 MED ORDER — FUROSEMIDE 10 MG/ML IJ SOLN
40.0000 mg | Freq: Once | INTRAMUSCULAR | Status: AC
  Administered 2013-06-21: 40 mg via INTRAVENOUS
  Filled 2013-06-21: qty 4

## 2013-06-21 MED ORDER — ALBUTEROL SULFATE (5 MG/ML) 0.5% IN NEBU: 2.5000 mg | INHALATION_SOLUTION | RESPIRATORY_TRACT | Status: DC | PRN

## 2013-06-21 MED ORDER — ONDANSETRON HCL 4 MG PO TABS: 4.0000 mg | ORAL_TABLET | Freq: Four times a day (QID) | ORAL | Status: DC | PRN

## 2013-06-21 MED ORDER — FUROSEMIDE 10 MG/ML IJ SOLN
40.0000 mg | Freq: Two times a day (BID) | INTRAMUSCULAR | Status: DC
  Administered 2013-06-22: 40 mg via INTRAVENOUS
  Filled 2013-06-21 (×4): qty 4

## 2013-06-21 MED ORDER — IPRATROPIUM BROMIDE 0.02 % IN SOLN
0.5000 mg | Freq: Four times a day (QID) | RESPIRATORY_TRACT | Status: DC
  Administered 2013-06-21 – 2013-06-22 (×5): 0.5 mg via RESPIRATORY_TRACT
  Filled 2013-06-21 (×6): qty 2.5

## 2013-06-21 MED ORDER — POTASSIUM CHLORIDE CRYS ER 20 MEQ PO TBCR
20.0000 meq | EXTENDED_RELEASE_TABLET | Freq: Every day | ORAL | Status: DC
  Administered 2013-06-22 – 2013-06-23 (×2): 20 meq via ORAL
  Filled 2013-06-21 (×2): qty 1

## 2013-06-21 MED ORDER — ONDANSETRON HCL 4 MG/2ML IJ SOLN: 4.0000 mg | Freq: Four times a day (QID) | INTRAMUSCULAR | Status: DC | PRN

## 2013-06-21 MED ORDER — LEVALBUTEROL HCL 0.63 MG/3ML IN NEBU: 0.6300 mg | INHALATION_SOLUTION | Freq: Four times a day (QID) | RESPIRATORY_TRACT | Status: DC

## 2013-06-21 MED ORDER — SODIUM CHLORIDE 0.9 % IJ SOLN
3.0000 mL | Freq: Two times a day (BID) | INTRAMUSCULAR | Status: DC
  Administered 2013-06-22 – 2013-06-23 (×2): 3 mL via INTRAVENOUS

## 2013-06-21 MED ORDER — LEVOTHYROXINE SODIUM 50 MCG PO TABS
50.0000 ug | ORAL_TABLET | Freq: Every day | ORAL | Status: DC
  Administered 2013-06-22 – 2013-06-23 (×2): 50 ug via ORAL
  Filled 2013-06-21 (×3): qty 1

## 2013-06-21 MED ORDER — INSULIN GLARGINE 100 UNIT/ML ~~LOC~~ SOLN
10.0000 [IU] | Freq: Every day | SUBCUTANEOUS | Status: DC
Start: 2013-06-21 — End: 2013-06-23
  Administered 2013-06-21 – 2013-06-22 (×2): 10 [IU] via SUBCUTANEOUS
  Filled 2013-06-21 (×3): qty 0.1

## 2013-06-21 MED ORDER — ASPIRIN EC 81 MG PO TBEC
81.0000 mg | DELAYED_RELEASE_TABLET | Freq: Every day | ORAL | Status: DC
  Administered 2013-06-22 – 2013-06-23 (×2): 81 mg via ORAL
  Filled 2013-06-21 (×2): qty 1

## 2013-06-21 MED ORDER — ISOSORBIDE MONONITRATE ER 60 MG PO TB24
60.0000 mg | ORAL_TABLET | Freq: Every day | ORAL | Status: DC
  Administered 2013-06-22 – 2013-06-23 (×2): 60 mg via ORAL
  Filled 2013-06-21 (×2): qty 1

## 2013-06-21 MED ORDER — ACETAMINOPHEN 325 MG PO TABS: 650.0000 mg | ORAL_TABLET | Freq: Four times a day (QID) | ORAL | Status: DC | PRN

## 2013-06-21 MED ORDER — LORAZEPAM 0.5 MG PO TABS
0.5000 mg | ORAL_TABLET | Freq: Every day | ORAL | Status: DC
  Administered 2013-06-21 – 2013-06-22 (×2): 0.5 mg via ORAL
  Filled 2013-06-21 (×2): qty 1

## 2013-06-21 MED ORDER — ALBUTEROL SULFATE (5 MG/ML) 0.5% IN NEBU
5.0000 mg | INHALATION_SOLUTION | Freq: Once | RESPIRATORY_TRACT | Status: AC
  Administered 2013-06-21: 5 mg via RESPIRATORY_TRACT
  Filled 2013-06-21: qty 1

## 2013-06-21 MED ORDER — INSULIN ASPART 100 UNIT/ML ~~LOC~~ SOLN
0.0000 [IU] | Freq: Three times a day (TID) | SUBCUTANEOUS | Status: DC
  Administered 2013-06-22: 2 [IU] via SUBCUTANEOUS
  Administered 2013-06-22: 13:00:00 3 [IU] via SUBCUTANEOUS

## 2013-06-21 NOTE — ED Notes (Signed)
Entered patient room to give neb tx Patient currently ambulating down the hallway to the bathroom Will medicate when finished

## 2013-06-21 NOTE — ED Notes (Signed)
Patient noted to be in A-fib on cardiac monitor PA at bedside

## 2013-06-21 NOTE — ED Notes (Signed)
Bed: NW29 Expected date:  Expected time:  Means of arrival:  Comments: Weakness, SOB

## 2013-06-21 NOTE — ED Notes (Signed)
Per EMS- Patient is a resident of [facility] living. Patient c/o severe SOB and weakness today, but EMS reported that the patient was seen at an UC last week for the same. Patient had Combivent neb treatments x 3 prior to EMS arrival. Crackles heard on the left lower loob. Patient is currently in atrial fib and having a few PVC's. Patient is legally blind. Staff at [facility] reported that the patient took a double dose of Imdur today because he had a headache and thought his BP was elevated.

## 2013-06-21 NOTE — ED Provider Notes (Signed)
CSN: [id number]     Arrival date & time 06/21/13  1701 History   First MD Initiated Contact with Patient 06/21/13 1712     Chief Complaint  Patient presents with  . Shortness of Breath  . Weakness   (Consider location/radiation/quality/duration/timing/severity/associated sxs/prior Treatment) The history is provided by the patient, a relative, a friend and medical records. No language interpreter was used.    Carlos Morton is a 79 y.o. male  with a hx of IDDM, a-fib, hypothyroid, CHF, emphysema CAD with stent, MI, C.Diff presents to the Emergency Department complaining of gradual, persistent, progressively worsening SOB onset this AM.  Pt was seen by PCP last week who gave him a new breathing medicine which he took today (unsure of what this was) and it seemed to cause him breathing difficulty afterward.  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  Daughter reports increased wheezing x2 weeks.  He also c/o headache today which is unusual. Family friend at bedside reports he was taken to Urgent Care where he was found to be in a-fib and hypoxic.  Pt denies fever, chills, headache, neck pain, CP, SOB, abd pain, N/V/D, dizziness, syncope.  Pt is a poor historian and most of the hx is obtained from the medical record and a phone conversation with the patient's daughter.  Arvind - PCP in [place] at [facility] and his cardiologist is there as well    Past Medical History  Diagnosis Date  . Hypertension   . Diabetes mellitus   . Thyroid disease   . High cholesterol   . Atrial fibrillation   . CHF (congestive heart failure)   . Emphysema     35% of lungs   Past Surgical History  Procedure Laterality Date  . Carotid stent      on blood thinner for stent  . Replacement total knee      left knee  . Appendectomy     History reviewed. No pertinent family history. History  Substance Use Topics  . Smoking status: Former Smoker -- 3.00 packs/day    Types:  Cigarettes  . Smokeless tobacco: Never Used  . Alcohol Use: No    Review of Systems  Constitutional: Positive for fatigue. Negative for fever, diaphoresis, appetite change and unexpected weight change.  HENT: Negative for mouth sores.   Eyes: Negative for visual disturbance.  Respiratory: Positive for shortness of breath and wheezing. Negative for cough and chest tightness.   Cardiovascular: Negative for chest pain.  Gastrointestinal: Negative for nausea, vomiting, abdominal pain, diarrhea and constipation.  Endocrine: Negative for polydipsia, polyphagia and polyuria.  Genitourinary: Negative for dysuria, urgency, frequency and hematuria.  Musculoskeletal: Negative for back pain and neck stiffness.  Skin: Negative for rash.  Allergic/Immunologic: Negative for immunocompromised state.  Neurological: Positive for weakness. Negative for syncope, light-headedness and headaches.  Hematological: Does not bruise/bleed easily.  Psychiatric/Behavioral: Negative for sleep disturbance. The patient is not nervous/anxious.     Allergies  Demerol; Diltiazem; Penicillins; and Sulfa drugs cross reactors  Home Medications   Current Outpatient Rx  Name  Route  Sig  Dispense  Refill  . albuterol (PROVENTIL) (2.5 MG/3ML) 0.083% nebulizer solution   Nebulization   Take 2.5 mg by nebulization every 6 (six) hours as needed for wheezing or shortness of breath.         Marland Kitchen aspirin EC 81 MG tablet   Oral   Take 81 mg by mouth daily.         Marland Kitchen  clopidogrel (PLAVIX) 75 MG tablet   Oral   Take 75 mg by mouth daily.         . furosemide (LASIX) 40 MG tablet   Oral   Take 40 mg by mouth 2 (two) times daily.         Marland Kitchen glimepiride (AMARYL) 2 MG tablet   Oral   Take 2 mg by mouth 2 (two) times daily.         . insulin glargine (LANTUS) 100 UNIT/ML injection   Subcutaneous   Inject 10 Units into the skin at bedtime.         . isosorbide mononitrate (IMDUR) 60 MG 24 hr tablet   Oral    Take 60 mg by mouth daily.         Marland Kitchen levothyroxine (SYNTHROID, LEVOTHROID) 50 MCG tablet   Oral   Take 50 mcg by mouth daily before breakfast.         . LORazepam (ATIVAN) 0.5 MG tablet   Oral   Take 0.5 mg by mouth at bedtime.         . lovastatin (MEVACOR) 20 MG tablet   Oral   Take 20 mg by mouth at bedtime.         . potassium chloride SA (K-DUR,KLOR-CON) 20 MEQ tablet   Oral   Take 20 mEq by mouth daily.         Marland Kitchen EXPIRED: albuterol (PROVENTIL) (5 MG/ML) 0.5% nebulizer solution   Nebulization   Take 0.5 mLs (2.5 mg total) by nebulization every 4 (four) hours as needed for wheezing or shortness of breath.   20 mL      . EXPIRED: furosemide (LASIX) 40 MG tablet   Oral   Take 1 tablet (40 mg total) by mouth 2 (two) times daily.   30 tablet      . EXPIRED: insulin glargine (LANTUS) 100 UNIT/ML injection   Subcutaneous   Inject 10 Units into the skin daily.   10 mL      . EXPIRED: ipratropium (ATROVENT) 0.02 % nebulizer solution   Nebulization   Take 2.5 mLs (0.5 mg total) by nebulization every 6 (six) hours as needed.   75 mL       BP 113/76  Pulse 91  Temp(Src) 98.1 F (36.7 C)  Resp 19  SpO2 100% Physical Exam  Nursing note and vitals reviewed. Constitutional: He appears well-developed and well-nourished. No distress.  Awake, alert, nontoxic appearance  HENT:  Head: Normocephalic and atraumatic.  Mouth/Throat: Oropharynx is clear and moist. No oropharyngeal exudate.  Eyes: Conjunctivae are normal. No scleral icterus.  Neck: Normal range of motion. Neck supple.  Cardiovascular: Normal rate, normal heart sounds and intact distal pulses.   No murmur heard. Irregularly irregular rhythm  Pulmonary/Chest: Effort normal. No accessory muscle usage. Not tachypneic. No respiratory distress. He has decreased breath sounds. He has wheezes. He has no rhonchi. He has rales. He exhibits no tenderness and no bony tenderness.  Rales and expiratory wheezes in  bilateral bases  Abdominal: Soft. Bowel sounds are normal. He exhibits no distension and no mass. There is no tenderness. There is no rebound and no guarding.  Musculoskeletal: Normal range of motion. He exhibits no edema.  Lymphadenopathy:    He has no cervical adenopathy.  Neurological: He is alert. He has normal strength. He displays no atrophy. No cranial nerve deficit. He exhibits normal muscle tone. Gait normal. GCS eye subscore is 4. GCS verbal subscore is 5.  GCS motor subscore is 6.  Speech is clear and goal oriented, follows commands Major Cranial nerves without deficit, no facial droop Normal strength in upper and lower extremities bilaterally including dorsiflexion and plantar flexion, strong and equal grip strength Sensation normal to light and sharp touch Moves extremities without ataxia, coordination intact Normal finger to nose Normal gait and balance   Skin: Skin is warm and dry. No rash noted. He is not diaphoretic. No erythema.  Psychiatric: He has a normal mood and affect. His behavior is normal.    ED Course  Procedures (including critical care time) Labs Review Labs Reviewed  CBC WITH DIFFERENTIAL - Abnormal; Notable for the following:    WBC 12.4 (*)    Neutro Abs 9.1 (*)    All other components within normal limits  COMPREHENSIVE METABOLIC PANEL - Abnormal; Notable for the following:    Sodium 134 (*)    Glucose, Bld 135 (*)    GFR calc non Af Amer 61 (*)    GFR calc Af Amer 71 (*)    All other components within normal limits  PRO B NATRIURETIC PEPTIDE - Abnormal; Notable for the following:    Pro B Natriuretic peptide (BNP) 2179.0 (*)    All other components within normal limits  URINALYSIS, ROUTINE W REFLEX MICROSCOPIC - Abnormal; Notable for the following:    APPearance CLOUDY (*)    All other components within normal limits  TROPONIN I   Imaging Review Dg Chest 2 View  06/21/2013   CLINICAL DATA:  Shortness of breath with weakness.  EXAM: CHEST  2  VIEW  COMPARISON:  10/22/2011 radiographs.  FINDINGS: There are lower lung volumes with resulting mild bibasilar atelectasis. Cardiomegaly is similar to the prior examination. There is increased vascular congestion with suspicion of mild edema. No significant pleural fluid is evident. Degenerative changes throughout the thoracic spine and at both glenohumeral joints are noted.  IMPRESSION: Cardiomegaly with increased vascular congestion and suspected mild edema.   Electronically Signed   By: Roxy Horseman M.D.   On: 06/21/2013 18:12    EKG Interpretation    Date/Time:  Wednesday June 21 2013 17:19:56 EST Ventricular Rate:  86 PR Interval:    QRS Duration: 102 QT Interval:  411 QTC Calculation: 492 R Axis:   -18 Text Interpretation:  Atrial fibrillation Ventricular premature complex Abnormal R-wave progression, early transition Probable LVH with secondary repol abnrm Inferior infarct, old No significant change since last tracing Confirmed by GOLDSTON  MD, SCOTT ([id number]) on 06/21/2013 7:35:08 PM            MDM   1. CHF exacerbation   2. Weakness   3. Wheezing   4. Dyspnea      Denny Levy presents with SOB, subjective weakness without focal neurologic deficit, a-fib (with a hx of same).  Pt with hx of MI and CHF.  Will begin work-up and give albuterol   8:02 PM Patient given albuterol treatment with mild increase in dyspnea. Patient becomes very dyspneic with ambulation to the bathroom.  He has remained on 4 L of O2 here in the emergency department.  Patient without evidence of urinary tract infection, CBC with mild leukocytosis of 12.4 and CMP largely unremarkable. BNP with elevation 2179, unknown baseline. Troponin negative. ECG with A. fib. Chest x-ray with cardiomegaly and increased vascular congestive suspected mild edema.  Patient appears to have CHF exacerbation.  No evidence of pneumonia on x-ray. Wheezing and Rales in bilateral lower field.  Will proceed with  admission.  The patient was discussed with and seen by Dr. Pricilla Loveless who agrees with the treatment plan.  8:24 PM  Discussed with Dr. Toniann Fail who will admit.  Pt given lasix 40mg  IV.   Dahlia Client Addaleigh Nicholls, PA-C 06/21/13 2024

## 2013-06-21 NOTE — ED Notes (Signed)
Patient back from bathroom Patient noted to be mildly DOE/SOB upon after ambulating Fine crackles noted to bilateral bases  Neb tx given, see MAR

## 2013-06-21 NOTE — H&P (Signed)
[facility] History and Physical  Huie Ghuman UJW:[id number] DOB: 1917-04-19 DOA: 06/21/2013  Referring physician: ER physician. PCP: Karle Plumber, MD   Chief Complaint: Shortness of breath.  HPI: Carlos Morton is a 79 y.o. male with history of CHF last EF measured in our system was in April 2013 showed 40%, COPD, atrial fibrillation, diabetes mellitus, CAD status post stenting, hypothyroidism started experiencing shortness of breath since morning. He states he was fine yesterday. Shortness of breath was more exertional have had productive cough. Denies fever chills chest pain nausea vomiting diaphoresis abdominal pain. Since his symptoms gradually got worse he was taken to the urgent care and was referred to the ER. In the ER chest x-ray shows features consistent with congestion and BNP was found to be elevated. Patient at this time is requiring 4 L of nasal cannula oxygen to maintain his saturation. Otherwise patient is not in acute distress has been admitted for possible CHF exacerbation. On examination patient has a mild expiratory wheeze. Patient is afebrile. Patient states that he was or any scheduled followup with his primary cardiologist this week.   Review of Systems: As presented in the history of presenting illness, rest negative.  Past Medical History  Diagnosis Date  . Hypertension   . Diabetes mellitus   . Thyroid disease   . High cholesterol   . Atrial fibrillation   . CHF (congestive heart failure)   . Emphysema     35% of lungs   Past Surgical History  Procedure Laterality Date  . Carotid stent      on blood thinner for stent  . Replacement total knee      left knee  . Appendectomy     Social History:  reports that he has quit smoking. His smoking use included Cigarettes. He smoked 3.00 packs per day. He has never used smokeless tobacco. He reports that he does not drink alcohol or use illicit drugs. Where does patient live independent living  facility. Can patient participate in ADLs? Yes.  Allergies  Allergen Reactions  . Demerol [Meperidine]      Unknown per pt  mar  . Diltiazem      Unknown per pt  mar  . Penicillins      Unknown per pt  mar  . Sulfa Drugs Cross Reactors      Unknown per pt  mar    Family History: History reviewed. No pertinent family history.    Prior to Admission medications   Medication Sig Start Date End Date Taking? Authorizing Provider  albuterol (PROVENTIL) (2.5 MG/3ML) 0.083% nebulizer solution Take 2.5 mg by nebulization every 6 (six) hours as needed for wheezing or shortness of breath.   Yes Historical Provider, MD  aspirin EC 81 MG tablet Take 81 mg by mouth daily.   Yes Historical Provider, MD  clopidogrel (PLAVIX) 75 MG tablet Take 75 mg by mouth daily.   Yes Historical Provider, MD  furosemide (LASIX) 40 MG tablet Take 40 mg by mouth 2 (two) times daily.   Yes Historical Provider, MD  glimepiride (AMARYL) 2 MG tablet Take 2 mg by mouth 2 (two) times daily.   Yes Historical Provider, MD  insulin glargine (LANTUS) 100 UNIT/ML injection Inject 10 Units into the skin at bedtime.   Yes Historical Provider, MD  isosorbide mononitrate (IMDUR) 60 MG 24 hr tablet Take 60 mg by mouth daily.   Yes Historical Provider, MD  levothyroxine (SYNTHROID, LEVOTHROID) 50 MCG tablet Take 50 mcg by mouth daily  before breakfast.   Yes Historical Provider, MD  LORazepam (ATIVAN) 0.5 MG tablet Take 0.5 mg by mouth at bedtime.   Yes Historical Provider, MD  lovastatin (MEVACOR) 20 MG tablet Take 20 mg by mouth at bedtime.   Yes Historical Provider, MD  potassium chloride SA (K-DUR,KLOR-CON) 20 MEQ tablet Take 20 mEq by mouth daily.   Yes Historical Provider, MD  albuterol (PROVENTIL) (5 MG/ML) 0.5% nebulizer solution Take 0.5 mLs (2.5 mg total) by nebulization every 4 (four) hours as needed for wheezing or shortness of breath. 10/28/11 10/27/12  Henderson Cloud, MD  furosemide (LASIX) 40 MG tablet Take 1  tablet (40 mg total) by mouth 2 (two) times daily. 10/28/11 10/27/12  Henderson Cloud, MD  insulin glargine (LANTUS) 100 UNIT/ML injection Inject 10 Units into the skin daily. 10/28/11 10/27/12  Henderson Cloud, MD  ipratropium (ATROVENT) 0.02 % nebulizer solution Take 2.5 mLs (0.5 mg total) by nebulization every 6 (six) hours as needed. 10/28/11 10/27/12  Henderson Cloud, MD    Physical Exam: Filed Vitals:   06/21/13 2018 06/21/13 2030 06/21/13 2037 06/21/13 2041  BP:  117/68    Pulse: 92 90 84 83  Temp:      Resp: 19 21 21 20   SpO2: 96% 93% 96% 97%     General:  Well-developed and nourished.  Eyes: Anicteric no pallor.  ENT: No discharge from ears eyes nose mouth.  Neck: No mass felt. No JVD appreciated.  Cardiovascular: S1-S2 heard. Irregular.  Respiratory: Mild expiratory wheeze heard no crepitations.  Abdomen: Soft nontender bowel sounds present.  Skin: No rash.  Musculoskeletal: No edema.  Psychiatric: Appears normal.  Neurologic: Alert awake oriented to time place and person. Moves all extremities.  Labs on Admission:  Basic Metabolic Panel:  Recent Labs Lab 06/21/13 1750  NA 134*  K 4.3  CL 98  CO2 27  GLUCOSE 135*  BUN 18  CREATININE 1.01  CALCIUM 9.2   Liver Function Tests:  Recent Labs Lab 06/21/13 1750  AST 16  ALT 8  ALKPHOS 73  BILITOT 0.8  PROT 7.1  ALBUMIN 3.9   No results found for this basename: LIPASE, AMYLASE,  in the last 168 hours No results found for this basename: AMMONIA,  in the last 168 hours CBC:  Recent Labs Lab 06/21/13 1750  WBC 12.4*  NEUTROABS 9.1*  HGB 13.0  HCT 39.3  MCV 84.5  PLT 179   Cardiac Enzymes:  Recent Labs Lab 06/21/13 1750  TROPONINI <0.30    BNP (last 3 results)  Recent Labs  06/21/13 1750  PROBNP 2179.0*   CBG: No results found for this basename: GLUCAP,  in the last 168 hours  Radiological Exams on Admission: Dg Chest 2 View  06/21/2013    CLINICAL DATA:  Shortness of breath with weakness.  EXAM: CHEST  2 VIEW  COMPARISON:  10/22/2011 radiographs.  FINDINGS: There are lower lung volumes with resulting mild bibasilar atelectasis. Cardiomegaly is similar to the prior examination. There is increased vascular congestion with suspicion of mild edema. No significant pleural fluid is evident. Degenerative changes throughout the thoracic spine and at both glenohumeral joints are noted.  IMPRESSION: Cardiomegaly with increased vascular congestion and suspected mild edema.   Electronically Signed   By: Roxy Horseman M.D.   On: 06/21/2013 18:12    EKG: Independently reviewed. A. fib with controlled rate.  Assessment/Plan Principal Problem:   CHF exacerbation Active Problems:  Diabetes mellitus type 2 with complications   CAD (coronary artery disease), native coronary artery   COPD bronchitis   Hypothyroidism (acquired)   Atrial fibrillation with controlled ventricular response   CHF (congestive heart failure)   1. Decompensated CHF - last EF measured was 40% in our system on April 2013. Patient also has had severe mitral regurgitation recorded in the same 2-D echo. ER physician has given Lasix 80 mg IV one dose and I have ordered 40 mg IV every 12. I have also ordered 2-D echo to check LV and patient's mitral regurgitation. Closely observe in telemetry. Patient presently is not in distress though requires 4 L nasal cannula. Closely follow intake output and metabolic panel. 2. COPD - patient does have mild expiratory wheeze. I have placed patient on Pulmicort and Xopenex and Atrovent nebulizer. I don't see any definite indication for antibiotics at this time. 3. Atrial fibrillation - rate controlled. Not on anticoagulants. Not sure the reason for not being on anticoagulants. Continue aspirin and Plavix. I have requested patient's records from patient's cardiologist at [facility], [place]. 4. CAD status post stenting - denies any  chest pain but due to acute worsening of his CHF have ordered further cardiac markers and also check 2-D echo. 5. Diabetes mellitus type 2 - continue home medications with sliding-scale coverage. 6. Hypothyroidism - continue Synthroid recheck TSH. 7. Hyperlipidemia - continue statins.  I have reviewed patient's old charts compare old labs and personally reviewed chest x-ray and EKG. I have requested for patient's medical records from [facility].   Code Status: Full code.   Family Communication: None.   Disposition Plan: Admit to inpatient.    KAKRAKANDY,ARSHAD N. [facility] Pager [phone]. If 7PM-7AM, please contact night-coverage [email or web address] Password [facility] 06/21/2013, 8:54 PM

## 2013-06-22 DIAGNOSIS — I5023 Acute on chronic systolic (congestive) heart failure: Principal | ICD-10-CM

## 2013-06-22 DIAGNOSIS — E118 Type 2 diabetes mellitus with unspecified complications: Secondary | ICD-10-CM

## 2013-06-22 LAB — COMPREHENSIVE METABOLIC PANEL WITH GFR
ALT: 8 U/L (ref 0–53)
AST: 14 U/L (ref 0–37)
Albumin: 3.4 g/dL — ABNORMAL LOW (ref 3.5–5.2)
Alkaline Phosphatase: 66 U/L (ref 39–117)
BUN: 18 mg/dL (ref 6–23)
CO2: 28 meq/L (ref 19–32)
Calcium: 8.9 mg/dL (ref 8.4–10.5)
Chloride: 100 meq/L (ref 96–112)
Creatinine, Ser: 0.97 mg/dL (ref 0.50–1.35)
GFR calc Af Amer: 79 mL/min — ABNORMAL LOW
GFR calc non Af Amer: 68 mL/min — ABNORMAL LOW
Glucose, Bld: 75 mg/dL (ref 70–99)
Potassium: 3 meq/L — ABNORMAL LOW (ref 3.5–5.1)
Sodium: 137 meq/L (ref 135–145)
Total Bilirubin: 0.8 mg/dL (ref 0.3–1.2)
Total Protein: 6.5 g/dL (ref 6.0–8.3)

## 2013-06-22 LAB — CBC WITH DIFFERENTIAL/PLATELET
Basophils Absolute: 0.1 K/uL (ref 0.0–0.1)
Basophils Relative: 1 % (ref 0–1)
Eosinophils Absolute: 0.6 K/uL (ref 0.0–0.7)
Eosinophils Relative: 5 % (ref 0–5)
HCT: 38.3 % — ABNORMAL LOW (ref 39.0–52.0)
Hemoglobin: 13.3 g/dL (ref 13.0–17.0)
Lymphocytes Relative: 24 % (ref 12–46)
Lymphs Abs: 2.6 K/uL (ref 0.7–4.0)
MCH: 29 pg (ref 26.0–34.0)
MCHC: 34.7 g/dL (ref 30.0–36.0)
MCV: 83.6 fL (ref 78.0–100.0)
Monocytes Absolute: 1 K/uL (ref 0.1–1.0)
Monocytes Relative: 9 % (ref 3–12)
Neutro Abs: 6.7 K/uL (ref 1.7–7.7)
Neutrophils Relative %: 61 % (ref 43–77)
Platelets: 156 K/uL (ref 150–400)
RBC: 4.58 MIL/uL (ref 4.22–5.81)
RDW: 14.4 % (ref 11.5–15.5)
WBC: 10.9 K/uL — ABNORMAL HIGH (ref 4.0–10.5)

## 2013-06-22 LAB — PRO B NATRIURETIC PEPTIDE: Pro B Natriuretic peptide (BNP): 2124 pg/mL — ABNORMAL HIGH (ref 0–450)

## 2013-06-22 LAB — GLUCOSE, CAPILLARY
Glucose-Capillary: 80 mg/dL (ref 70–99)
Glucose-Capillary: 230 mg/dL — ABNORMAL HIGH (ref 70–99)
Glucose-Capillary: 173 mg/dL — ABNORMAL HIGH (ref 70–99)
Glucose-Capillary: 140 mg/dL — ABNORMAL HIGH (ref 70–99)

## 2013-06-22 LAB — TSH: TSH: 2.916 u[IU]/mL (ref 0.350–4.500)

## 2013-06-22 LAB — MAGNESIUM: Magnesium: 2 mg/dL (ref 1.5–2.5)

## 2013-06-22 LAB — TROPONIN I
Troponin I: <0.3 ng/mL (ref <0.30)
Troponin I: <0.3 ng/mL

## 2013-06-22 LAB — MRSA PCR SCREENING: MRSA by PCR: NEGATIVE

## 2013-06-22 MED ORDER — FUROSEMIDE 10 MG/ML IJ SOLN
40.0000 mg | Freq: Two times a day (BID) | INTRAMUSCULAR | Status: DC
  Administered 2013-06-22 – 2013-06-23 (×2): 40 mg via INTRAVENOUS
  Filled 2013-06-22 (×2): qty 4

## 2013-06-22 MED ORDER — FUROSEMIDE 40 MG PO TABS
40.0000 mg | ORAL_TABLET | Freq: Two times a day (BID) | ORAL | Status: DC
  Filled 2013-06-22 (×2): qty 1

## 2013-06-22 NOTE — ED Provider Notes (Signed)
Medical screening examination/treatment/procedure(s) were conducted as a shared visit with non-physician practitioner(s) and myself.  I personally evaluated the patient during the encounter.  EKG Interpretation    Date/Time:  Wednesday June 21 2013 17:19:56 EST Ventricular Rate:  86 PR Interval:    QRS Duration: 102 QT Interval:  411 QTC Calculation: 492 R Axis:   -18 Text Interpretation:  Atrial fibrillation Ventricular premature complex Abnormal R-wave progression, early transition Probable LVH with secondary repol abnrm Inferior infarct, old No significant change since last tracing Confirmed by Owin Vignola  MD, Quint Chestnut ([id number]) on 06/21/2013 7:35:08 PM            Patient with worsening dyspnea. CHF vs reactive airway disease. No evidence of PNA. Significantly short of breath with minimal walking in ED, will admit for lasix and pulmonary toilet.   Audree Camel, MD 06/22/13 [phone]

## 2013-06-22 NOTE — Evaluation (Signed)
Physical Therapy Evaluation Patient Details Name: Carlos Morton MRN: [id number] DOB: [birth year] Today's Date: 06/22/2013 Time: 1324-4010 PT Time Calculation (min): 21 min  PT Assessment / Plan / Recommendation History of Present Illness  79 yo male admitted with CHR exac, SOB. Hx of DM, A fib, CHR, MI. Pt is from [facility]  Clinical Impression  On eval, pt required Min guard assist for mobility-able to ambulate ~75 feet with walker. O2 sats dropped to 82% on RA during ambulation; 96% 2L O2 during ambulation. Recommend HHPT at ALF.     PT Assessment  Patient needs continued PT services    Follow Up Recommendations  Home health PT (at ALF)    Does the patient have the potential to tolerate intense rehabilitation      Barriers to Discharge        Equipment Recommendations  None recommended by PT    Recommendations for Other Services     Frequency Min 3X/week    Precautions / Restrictions Precautions Precautions: None Precaution Comments: legally blind Restrictions Weight Bearing Restrictions: No   Pertinent Vitals/Pain No c/o pain      Mobility  Bed Mobility Bed Mobility: Supine to Sit Supine to Sit: 6: Modified independent (Device/Increase time);HOB elevated;With rails Transfers Transfers: Sit to Stand;Stand to Sit Sit to Stand: 5: Supervision;From bed Stand to Sit: 5: Supervision;To chair/3-in-1 Ambulation/Gait Ambulation/Gait Assistance: 4: Min guard Ambulation Distance (Feet): 75 Feet Assistive device: Rolling walker Ambulation/Gait Assistance Details: O2 sats dropped to 82% on RA, 96% 2L O2 during ambulation. slow gait speed. fatigues fairly easily Gait Pattern: Step-through pattern    Exercises     PT Diagnosis: Difficulty walking;Generalized weakness  PT Problem List: Decreased strength;Decreased activity tolerance;Decreased mobility PT Treatment Interventions: DME instruction;Gait training;Functional mobility training;Therapeutic  activities;Therapeutic exercise;Patient/family education     PT Goals(Current goals can be found in the care plan section) Acute Rehab PT Goals Patient Stated Goal: home tomorrow PT Goal Formulation: With patient Time For Goal Achievement: 07/06/13 Potential to Achieve Goals: Good  Visit Information  Last PT Received On: 06/22/13 Assistance Needed: +1 History of Present Illness: 79 yo male admitted with CHR exac, SOB. Hx of DM, A fib, CHR, MI. Pt is from [facility]       Prior Functioning  Home Living Family/patient expects to be discharged to:: Assisted living Home Equipment: Dan Humphreys - 4 wheels;Grab bars - tub/shower Prior Function Level of Independence: Independent with assistive device(s) Gait / Transfers Assistance Needed: uses rollator Communication Communication: No difficulties    Cognition  Cognition Arousal/Alertness: Awake/alert Behavior During Therapy: WFL for tasks assessed/performed Overall Cognitive Status: Within Functional Limits for tasks assessed    Extremity/Trunk Assessment Upper Extremity Assessment Upper Extremity Assessment: Overall WFL for tasks assessed Lower Extremity Assessment Lower Extremity Assessment: Generalized weakness Cervical / Trunk Assessment Cervical / Trunk Assessment: Normal   Balance    End of Session PT - End of Session Equipment Utilized During Treatment: Oxygen Activity Tolerance: Patient limited by fatigue Patient left: in chair;with call bell/phone within reach Nurse Communication: Mobility status  GP     Rebeca Alert, MPT Pager: [phone]

## 2013-06-22 NOTE — Progress Notes (Signed)
[facility] PROGRESS NOTE  Carlos Morton JYN:[id number] DOB: 18-May-1917 DOA: 06/21/2013 PCP: Karle Plumber, MD  Assessment/Plan: 1. Acute decompensated systolic congestive heart failure. Present on admission. Patient presenting with clinical signs and symptoms consistent with acute CHF, initial lab work showed elevated BNP of 2179. Presenting chest x-ray showed cardiomegaly with increased vascular congestion and mild edema present. A transthoracic echocardiogram performed on 10/28/2011 showed an ejection fraction of 40% with septal apical and inferior basal hypokinesis. Patient was diuresed with 40 mg of IV Lasix twice a day, putting out approximately 750 MLS of urine. We'll continue diuresis with IV Lasix.  Follow intake and output, daily weights.  2. Atrial fibrillation, rate controlled. Patient presently not on chronic anticoagulation, will continue aspirin Plavix. 3. Chronic obstructive pulmonary disease. Continue nebulizers. A respiratory standpoint he states feeling much better today. I think presenting symptoms were likely secondary to acute CHF rather than COPD exacerbation. 4. Type 2 diabetes mellitus continue Accu-Cheks q. a.c. and each bedtime with sliding scale coverage. Patient on Lantus 10 units subcutaneous each bedtime and glimeprimide.  5. Coronary artery disease. Patient presently denied chest pain. He has a history of percutaneous interventions. Followup on transthoracic echocardiogram. Overnight he had 3 negative troponins. Continue dual agent antiplatelet therapy with aspirin Plavix, statin, nitrate.  Code Status: full code Disposition Plan: Continue diuresis   Procedures:  Pending Echo   HPI/Subjective: Patient with history of systolic congestive heart failure, admitted overnight for acute decompensated heart failure, started on Lasix 40 mg IV twice a day. He put out approximately 750 mL's of urine, reports feeling better this morning.  Objective: Filed Vitals:    06/22/13 1300  BP: 91/56  Pulse: 77  Temp: 97.7 F (36.5 C)  Resp: 18    Intake/Output Summary (Last 24 hours) at 06/22/13 1654 Last data filed at 06/22/13 0900  Gross per 24 hour  Intake    243 ml  Output    750 ml  Net   -507 ml   Filed Weights   06/21/13 2122 06/22/13 0530  Weight: 71.5 kg (157 lb 10.1 oz) 69.1 kg (152 lb 5.4 oz)    Exam:   General:  No acute distress, states feeling better breathing easier  Cardiovascular: Regular rate rhythm normal S1-S2  Respiratory: Has bibasilar crackles normal respiratory effort on supplemental oxygen  Abdomen: Soft nontender nondistended  Musculoskeletal: No edema  Data Reviewed: Basic Metabolic Panel:  Recent Labs Lab 06/21/13 1750 06/22/13 0330  NA 134* 137  K 4.3 3.0*  CL 98 100  CO2 27 28  GLUCOSE 135* 75  BUN 18 18  CREATININE 1.01 0.97  CALCIUM 9.2 8.9  MG  --  2.0   Liver Function Tests:  Recent Labs Lab 06/21/13 1750 06/22/13 0330  AST 16 14  ALT 8 8  ALKPHOS 73 66  BILITOT 0.8 0.8  PROT 7.1 6.5  ALBUMIN 3.9 3.4*   No results found for this basename: LIPASE, AMYLASE,  in the last 168 hours No results found for this basename: AMMONIA,  in the last 168 hours CBC:  Recent Labs Lab 06/21/13 1750 06/22/13 0330  WBC 12.4* 10.9*  NEUTROABS 9.1* 6.7  HGB 13.0 13.3  HCT 39.3 38.3*  MCV 84.5 83.6  PLT 179 156   Cardiac Enzymes:  Recent Labs Lab 06/21/13 1750 06/22/13 0330 06/22/13 0848  TROPONINI <0.30 <0.30 <0.30   BNP (last 3 results)  Recent Labs  06/21/13 1750 06/22/13 0330  PROBNP 2179.0* 2124.0*   CBG:  Recent Labs Lab 06/21/13 2136 06/22/13 1139  GLUCAP 177* 230*    Recent Results (from the past 240 hour(s))  MRSA PCR SCREENING     Status: None   Collection Time    06/21/13  9:29 PM      Result Value Range Status   MRSA by PCR NEGATIVE  NEGATIVE Final   Comment:            The GeneXpert MRSA Assay (FDA     approved for NASAL specimens     only), is one  component of a     comprehensive MRSA colonization     surveillance program. It is not     intended to diagnose MRSA     infection nor to guide or     monitor treatment for     MRSA infections.     Studies: Dg Chest 2 View  06/21/2013   CLINICAL DATA:  Shortness of breath with weakness.  EXAM: CHEST  2 VIEW  COMPARISON:  10/22/2011 radiographs.  FINDINGS: There are lower lung volumes with resulting mild bibasilar atelectasis. Cardiomegaly is similar to the prior examination. There is increased vascular congestion with suspicion of mild edema. No significant pleural fluid is evident. Degenerative changes throughout the thoracic spine and at both glenohumeral joints are noted.  IMPRESSION: Cardiomegaly with increased vascular congestion and suspected mild edema.   Electronically Signed   By: Roxy Horseman M.D.   On: 06/21/2013 18:12    Scheduled Meds: . aspirin EC  81 mg Oral Daily  . budesonide (PULMICORT) nebulizer solution  0.25 mg Nebulization BID  . clopidogrel  75 mg Oral Q breakfast  . enoxaparin (LOVENOX) injection  40 mg Subcutaneous Q24H  . furosemide  40 mg Intravenous BID  . glimepiride  2 mg Oral BID WC  . insulin aspart  0-9 Units Subcutaneous TID WC  . insulin glargine  10 Units Subcutaneous QHS  . ipratropium  0.5 mg Nebulization Q6H  . isosorbide mononitrate  60 mg Oral Daily  . levalbuterol  0.63 mg Nebulization Q6H  . levothyroxine  50 mcg Oral QAC breakfast  . LORazepam  0.5 mg Oral QHS  . potassium chloride SA  20 mEq Oral Daily  . simvastatin  10 mg Oral q1800  . sodium chloride  3 mL Intravenous Q12H  . sodium chloride  3 mL Intravenous Q12H   Continuous Infusions:   Principal Problem:   CHF exacerbation Active Problems:   Diabetes mellitus type 2 with complications   CAD (coronary artery disease), native coronary artery   COPD bronchitis   Hypothyroidism (acquired)   Atrial fibrillation with controlled ventricular response   CHF (congestive heart  failure)    Time spent: 35 minutes    Jeralyn Bennett  [facility] Pager [phone]. If 7PM-7AM, please contact night-coverage at [email or web address], password [facility] 06/22/2013, 4:54 PM  LOS: 1 day

## 2013-06-23 DIAGNOSIS — R0609 Other forms of dyspnea: Secondary | ICD-10-CM

## 2013-06-23 DIAGNOSIS — I059 Rheumatic mitral valve disease, unspecified: Secondary | ICD-10-CM

## 2013-06-23 LAB — CBC
HCT: 38 % — ABNORMAL LOW (ref 39.0–52.0)
Hemoglobin: 12.9 g/dL — ABNORMAL LOW (ref 13.0–17.0)
MCHC: 33.9 g/dL (ref 30.0–36.0)
RBC: 4.51 MIL/uL (ref 4.22–5.81)
WBC: 11 10*3/uL — ABNORMAL HIGH (ref 4.0–10.5)

## 2013-06-23 LAB — BASIC METABOLIC PANEL
BUN: 21 mg/dL (ref 6–23)
Chloride: 98 mEq/L (ref 96–112)
GFR calc Af Amer: 66 mL/min — ABNORMAL LOW (ref >90)
GFR calc non Af Amer: 57 mL/min — ABNORMAL LOW (ref >90)
Potassium: 3.7 mEq/L (ref 3.5–5.1)
Sodium: 134 mEq/L — ABNORMAL LOW (ref 135–145)

## 2013-06-23 LAB — GLUCOSE, CAPILLARY
Glucose-Capillary: 97 mg/dL (ref 70–99)
Glucose-Capillary: 198 mg/dL — ABNORMAL HIGH (ref 70–99)

## 2013-06-23 MED ORDER — LISINOPRIL 2.5 MG PO TABS: 2.5000 mg | ORAL_TABLET | Freq: Every day | ORAL | Status: DC

## 2013-06-23 MED ORDER — LORAZEPAM 0.5 MG PO TABS: 0.5000 mg | ORAL_TABLET | Freq: Every day | ORAL | Status: DC

## 2013-06-23 MED ORDER — INSULIN GLARGINE 100 UNIT/ML ~~LOC~~ SOLN: 10.0000 [IU] | Freq: Every day | SUBCUTANEOUS | Status: DC

## 2013-06-23 MED ORDER — IPRATROPIUM BROMIDE 0.02 % IN SOLN
0.5000 mg | Freq: Four times a day (QID) | RESPIRATORY_TRACT | Status: DC
  Administered 2013-06-23 (×2): 0.5 mg via RESPIRATORY_TRACT
  Filled 2013-06-23 (×2): qty 2.5

## 2013-06-23 MED ORDER — LEVALBUTEROL HCL 0.63 MG/3ML IN NEBU
0.6300 mg | INHALATION_SOLUTION | Freq: Four times a day (QID) | RESPIRATORY_TRACT | Status: DC
  Administered 2013-06-23 (×2): 0.63 mg via RESPIRATORY_TRACT
  Filled 2013-06-23 (×6): qty 3

## 2013-06-23 NOTE — Discharge Summary (Addendum)
Physician Discharge Summary  Kayden Hutmacher JWJ:[id number] DOB: Apr 08, 1917 DOA: 06/21/2013  PCP: Karle Plumber, MD  Admit date: 06/21/2013 Discharge date: 06/23/2013  Time spent: 35 minutes  Recommendations for Outpatient Follow-up:  1. Please follow up on repeat BMP in 2-3 days 2. Please follow up on results from 2D echo performed on 06/23/13  Discharge Diagnoses:  Principal Problem:   CHF exacerbation Active Problems:   Diabetes mellitus type 2 with complications   CAD (coronary artery disease), native coronary artery   COPD bronchitis   Hypothyroidism (acquired)   Atrial fibrillation with controlled ventricular response   CHF (congestive heart failure)   Discharge Condition: Stable/Improved  Diet recommendation: Heart Healthy, low sodium, fluid restriction diet.   Filed Weights   06/21/13 2122 06/22/13 0530 06/23/13 0448  Weight: 71.5 kg (157 lb 10.1 oz) 69.1 kg (152 lb 5.4 oz) 69.3 kg (152 lb 12.5 oz)    History of present illness:  Carlos Morton is a 79 y.o. male with history of CHF last EF measured in our system was in April 2013 showed 40%, COPD, atrial fibrillation, diabetes mellitus, CAD status post stenting, hypothyroidism started experiencing shortness of breath since morning. He states he was fine yesterday. Shortness of breath was more exertional have had productive cough. Denies fever chills chest pain nausea vomiting diaphoresis abdominal pain. Since his symptoms gradually got worse he was taken to the urgent care and was referred to the ER. In the ER chest x-ray shows features consistent with congestion and BNP was found to be elevated. Patient at this time is requiring 4 L of nasal cannula oxygen to maintain his saturation. Otherwise patient is not in acute distress has been admitted for possible CHF exacerbation. On examination patient has a mild expiratory wheeze. Patient is afebrile. Patient states that he was or any scheduled followup with his primary  cardiologist this week.   Hospital Course:  Patient is a pleasant 79 year old gentleman with a past medical history of congestive heart failure, with a transthoracic echocardiogram performed on 10/28/2011 showing an ejection fraction of 40% with septal apical and inferior basal hypokinesis. He is presently a resident at an assisted living facility transferred to the emergency department on 06/21/2013 with complaints of worsening shortness of breath. On presentation he required 4 L of supplemental oxygen via nasal cannula. He was admitted to telemetry diuresed with IV Lasix initially receiving 40 mg twice a day. By the following morning he reported feeling improved. He denied chest pain and had 3 negative troponins sets. IV diuresis was continued and by 06/23/2013 oxygen had been weaned off as he was satting mid 90s on room air. We ambulated him down the hallway and back which he tolerated fairly well. Lisinopril 2.5 mg by mouth daily was added to his regimen with holding parameters. Patient to followup at [facility] in 1-2 weeks. Please follow up on results of 2D Echo.    Discharge Exam: Filed Vitals:   06/23/13 0448  BP: 115/53  Pulse: 74  Temp: 97.4 F (36.3 C)  Resp: 20    General: Patient states feeling well, ambulating down all eye and back, states feeling ready to go home Cardiovascular: Regular rate rhythm normal S1-S2 no murmurs rubs or gallops Respiratory: He has a few mild bibasilar crackles Abdomen: Soft nontender nondistended Extremities: No edema  Discharge Instructions  Discharge Orders   Future Orders Complete By Expires   (HEART FAILURE PATIENTS) Call MD:  Anytime you have any of the following symptoms: 1) 3  pound weight gain in 24 hours or 5 pounds in 1 week 2) shortness of breath, with or without a dry hacking cough 3) swelling in the hands, feet or stomach 4) if you have to sleep on extra pillows at night in order to breathe.  As directed    Call MD for:   difficulty breathing, headache or visual disturbances  As directed    Call MD for:  extreme fatigue  As directed    Call MD for:  persistant dizziness or light-headedness  As directed    Call MD for:  persistant nausea and vomiting  As directed    Call MD for:  severe uncontrolled pain  As directed    Call MD for:  temperature >100.4  As directed    Diet - low sodium heart healthy  As directed    Increase activity slowly  As directed        Medication List         albuterol (2.5 MG/3ML) 0.083% nebulizer solution  Commonly known as:  PROVENTIL  Take 2.5 mg by nebulization every 6 (six) hours as needed for wheezing or shortness of breath.     albuterol (5 MG/ML) 0.5% nebulizer solution  Commonly known as:  PROVENTIL  Take 0.5 mLs (2.5 mg total) by nebulization every 4 (four) hours as needed for wheezing or shortness of breath.     aspirin EC 81 MG tablet  Take 81 mg by mouth daily.     clopidogrel 75 MG tablet  Commonly known as:  PLAVIX  Take 75 mg by mouth daily.     furosemide 40 MG tablet  Commonly known as:  LASIX  Take 40 mg by mouth 2 (two) times daily.     furosemide 40 MG tablet  Commonly known as:  LASIX  Take 1 tablet (40 mg total) by mouth 2 (two) times daily.     glimepiride 2 MG tablet  Commonly known as:  AMARYL  Take 2 mg by mouth 2 (two) times daily.     insulin glargine 100 UNIT/ML injection  Commonly known as:  LANTUS  Inject 10 Units into the skin daily.     insulin glargine 100 UNIT/ML injection  Commonly known as:  LANTUS  Inject 0.1 mLs (10 Units total) into the skin at bedtime.     ipratropium 0.02 % nebulizer solution  Commonly known as:  ATROVENT  Take 2.5 mLs (0.5 mg total) by nebulization every 6 (six) hours as needed.     isosorbide mononitrate 60 MG 24 hr tablet  Commonly known as:  IMDUR  Take 60 mg by mouth daily.     levothyroxine 50 MCG tablet  Commonly known as:  SYNTHROID, LEVOTHROID  Take 50 mcg by mouth daily before  breakfast.     lisinopril 2.5 MG tablet  Commonly known as:  ZESTRIL  Take 1 tablet (2.5 mg total) by mouth daily.     LORazepam 0.5 MG tablet  Commonly known as:  ATIVAN  Take 1 tablet (0.5 mg total) by mouth at bedtime.     lovastatin 20 MG tablet  Commonly known as:  MEVACOR  Take 20 mg by mouth at bedtime.     potassium chloride SA 20 MEQ tablet  Commonly known as:  K-DUR,KLOR-CON  Take 20 mEq by mouth daily.       Allergies  Allergen Reactions  . Demerol [Meperidine]      Unknown per pt  mar  . Diltiazem  Unknown per pt  mar  . Penicillins      Unknown per pt  mar  . Sulfa Drugs Cross Reactors      Unknown per pt  mar       Follow-up Information   Follow up with Karle Plumber, MD In 1 week.   Specialty:  Internal Medicine   Contact information:   [phone] [place] [address]        The results of significant diagnostics from this hospitalization (including imaging, microbiology, ancillary and laboratory) are listed below for reference.    Significant Diagnostic Studies: Dg Chest 2 View  06/21/2013   CLINICAL DATA:  Shortness of breath with weakness.  EXAM: CHEST  2 VIEW  COMPARISON:  10/22/2011 radiographs.  FINDINGS: There are lower lung volumes with resulting mild bibasilar atelectasis. Cardiomegaly is similar to the prior examination. There is increased vascular congestion with suspicion of mild edema. No significant pleural fluid is evident. Degenerative changes throughout the thoracic spine and at both glenohumeral joints are noted.  IMPRESSION: Cardiomegaly with increased vascular congestion and suspected mild edema.   Electronically Signed   By: Roxy Horseman M.D.   On: 06/21/2013 18:12    Microbiology: Recent Results (from the past 240 hour(s))  MRSA PCR SCREENING     Status: None   Collection Time    06/21/13  9:29 PM      Result Value Range Status   MRSA by PCR NEGATIVE  NEGATIVE Final   Comment:            The GeneXpert MRSA  Assay (FDA     approved for NASAL specimens     only), is one component of a     comprehensive MRSA colonization     surveillance program. It is not     intended to diagnose MRSA     infection nor to guide or     monitor treatment for     MRSA infections.     Labs: Basic Metabolic Panel:  Recent Labs Lab 06/21/13 1750 06/22/13 0330 06/23/13 0518  NA 134* 137 134*  K 4.3 3.0* 3.7  CL 98 100 98  CO2 27 28 28   GLUCOSE 135* 75 116*  BUN 18 18 21   CREATININE 1.01 0.97 1.08  CALCIUM 9.2 8.9 9.2  MG  --  2.0  --    Liver Function Tests:  Recent Labs Lab 06/21/13 1750 06/22/13 0330  AST 16 14  ALT 8 8  ALKPHOS 73 66  BILITOT 0.8 0.8  PROT 7.1 6.5  ALBUMIN 3.9 3.4*   No results found for this basename: LIPASE, AMYLASE,  in the last 168 hours No results found for this basename: AMMONIA,  in the last 168 hours CBC:  Recent Labs Lab 06/21/13 1750 06/22/13 0330 06/23/13 0518  WBC 12.4* 10.9* 11.0*  NEUTROABS 9.1* 6.7  --   HGB 13.0 13.3 12.9*  HCT 39.3 38.3* 38.0*  MCV 84.5 83.6 84.3  PLT 179 156 154   Cardiac Enzymes:  Recent Labs Lab 06/21/13 1750 06/22/13 0330 06/22/13 0848  TROPONINI <0.30 <0.30 <0.30   BNP: BNP (last 3 results)  Recent Labs  06/21/13 1750 06/22/13 0330  PROBNP 2179.0* 2124.0*   CBG:  Recent Labs Lab 06/22/13 1139 06/22/13 1727 06/22/13 2142 06/23/13 0750 06/23/13 1142  GLUCAP 230* 173* 140* 97 198*       Signed:  Amer Alcindor  [facility] 06/23/2013, 2:08 PM   Addendum  2-D echo  did not reveal significant change from 10/28/11.   Study Conclusions  - Left ventricle: Diffuse hypokinesis inferobasal akinesis The cavity size was moderately dilated. There was mild focal basal hypertrophy of the septum. Systolic function was moderately reduced. The estimated ejection fraction was in the range of 35% to 40%. - Aortic valve: Mild regurgitation. - Mitral valve: Mild to moderate regurgitation. -  Left atrium: The atrium was moderately to severely dilated. - Atrial septum: No defect or patent foramen ovale was identified

## 2013-06-23 NOTE — Progress Notes (Signed)
Physical Therapy Treatment Patient Details Name: Carlos Morton MRN: [id number] DOB: 1917/03/24 Today's Date: 06/23/2013 Time: 1035-1050 PT Time Calculation (min): 15 min  SATURATION QUALIFICATIONS: (This note is used to comply with regulatory documentation for home oxygen)  Patient Saturations on Room Air at Rest = 97%  Patient Saturations on Room Air while Ambulating = 89-90%    PT Assessment / Plan / Recommendation  History of Present Illness 79 yo male admitted with CHR exac, SOB. Hx of DM, A fib, CHR, MI. Pt is from [facility]   PT Comments   Progressing with mobility. No LOB with mobility. Possible d/c later today per pt.   Follow Up Recommendations  Home health PT     Does the patient have the potential to tolerate intense rehabilitation     Barriers to Discharge        Equipment Recommendations  None recommended by PT    Recommendations for Other Services    Frequency Min 3X/week   Progress towards PT Goals Progress towards PT goals: Progressing toward goals  Plan Current plan remains appropriate    Precautions / Restrictions Precautions Precautions: None Precaution Comments: legally blind Restrictions Weight Bearing Restrictions: No   Pertinent Vitals/Pain     Mobility  Bed Mobility Bed Mobility: Not assessed Details for Bed Mobility Assistance: pt sitting in recliner Transfers Transfers: Sit to Stand;Stand to Sit Sit to Stand: 5: Supervision;From chair/3-in-1 Stand to Sit: 5: Supervision;To chair/3-in-1 Ambulation/Gait Ambulation/Gait Assistance: 5: Supervision Ambulation Distance (Feet): 250 Feet Assistive device: Rolling walker Ambulation/Gait Assistance Details: good gait speed. O2 sats 89-90% on RA. No dyspnea noted.  Gait Pattern: Trunk flexed;Step-through pattern    Exercises     PT Diagnosis:    PT Problem List:   PT Treatment Interventions:     PT Goals (current goals can now be found in the care plan section)    Visit  Information  Last PT Received On: 06/23/13 Assistance Needed: +1 History of Present Illness: 79 yo male admitted with CHR exac, SOB. Hx of DM, A fib, CHR, MI. Pt is from [facility]    Subjective Data      Cognition  Cognition Arousal/Alertness: Awake/alert Behavior During Therapy: WFL for tasks assessed/performed Overall Cognitive Status: Within Functional Limits for tasks assessed    Balance     End of Session PT - End of Session Equipment Utilized During Treatment: Gait belt Activity Tolerance: Patient tolerated treatment well Patient left: in chair;with call bell/phone within reach;with family/visitor present   GP     Rebeca Alert, MPT Pager: [phone]

## 2013-06-23 NOTE — Progress Notes (Signed)
Echo Lab  2D Echocardiogram completed.  Alyssamae Klinck L Sedrick Tober, RDCS 06/23/2013 1:04 PM

## 2013-08-07 ENCOUNTER — Emergency Department (HOSPITAL_COMMUNITY)
Admission: EM | Admit: 2013-08-07 | Discharge: 2013-08-07 | Disposition: A | Discharge status: Home / Self Care | Payer: Medicare Other | Attending: Emergency Medicine | Admitting: Emergency Medicine

## 2013-08-07 ENCOUNTER — Emergency Department (HOSPITAL_COMMUNITY): Payer: Medicare Other

## 2013-08-07 ENCOUNTER — Encounter (HOSPITAL_COMMUNITY): Payer: Self-pay | Admitting: Emergency Medicine

## 2013-08-07 DIAGNOSIS — Z794 Long term (current) use of insulin: Secondary | ICD-10-CM | POA: Insufficient documentation

## 2013-08-07 DIAGNOSIS — J438 Other emphysema: Secondary | ICD-10-CM | POA: Insufficient documentation

## 2013-08-07 DIAGNOSIS — Z7982 Long term (current) use of aspirin: Secondary | ICD-10-CM | POA: Insufficient documentation

## 2013-08-07 DIAGNOSIS — E7800 Pure hypercholesterolemia, unspecified: Secondary | ICD-10-CM | POA: Insufficient documentation

## 2013-08-07 DIAGNOSIS — Z880 Allergy status to penicillin: Secondary | ICD-10-CM | POA: Insufficient documentation

## 2013-08-07 DIAGNOSIS — Z79899 Other long term (current) drug therapy: Secondary | ICD-10-CM | POA: Insufficient documentation

## 2013-08-07 DIAGNOSIS — I10 Essential (primary) hypertension: Secondary | ICD-10-CM | POA: Insufficient documentation

## 2013-08-07 DIAGNOSIS — Z87891 Personal history of nicotine dependence: Secondary | ICD-10-CM | POA: Insufficient documentation

## 2013-08-07 DIAGNOSIS — E162 Hypoglycemia, unspecified: Secondary | ICD-10-CM

## 2013-08-07 DIAGNOSIS — J111 Influenza due to unidentified influenza virus with other respiratory manifestations: Secondary | ICD-10-CM | POA: Insufficient documentation

## 2013-08-07 DIAGNOSIS — E1169 Type 2 diabetes mellitus with other specified complication: Secondary | ICD-10-CM | POA: Insufficient documentation

## 2013-08-07 DIAGNOSIS — E079 Disorder of thyroid, unspecified: Secondary | ICD-10-CM | POA: Insufficient documentation

## 2013-08-07 DIAGNOSIS — I959 Hypotension, unspecified: Secondary | ICD-10-CM | POA: Insufficient documentation

## 2013-08-07 DIAGNOSIS — Z7902 Long term (current) use of antithrombotics/antiplatelets: Secondary | ICD-10-CM | POA: Insufficient documentation

## 2013-08-07 DIAGNOSIS — R339 Retention of urine, unspecified: Secondary | ICD-10-CM | POA: Insufficient documentation

## 2013-08-07 DIAGNOSIS — I509 Heart failure, unspecified: Secondary | ICD-10-CM | POA: Insufficient documentation

## 2013-08-07 DIAGNOSIS — F29 Unspecified psychosis not due to a substance or known physiological condition: Secondary | ICD-10-CM | POA: Insufficient documentation

## 2013-08-07 LAB — CBC WITH DIFFERENTIAL/PLATELET
BASOS ABS: 0 10*3/uL (ref 0.0–0.1)
BASOS PCT: 0 % (ref 0–1)
Eosinophils Absolute: 0 10*3/uL (ref 0.0–0.7)
Eosinophils Relative: 1 % (ref 0–5)
HCT: 35.7 % — ABNORMAL LOW (ref 39.0–52.0)
Hemoglobin: 11.7 g/dL — ABNORMAL LOW (ref 13.0–17.0)
Lymphocytes Relative: 45 % (ref 12–46)
Lymphs Abs: 2.5 10*3/uL (ref 0.7–4.0)
MCH: 28.5 pg (ref 26.0–34.0)
MCHC: 32.8 g/dL (ref 30.0–36.0)
MCV: 86.9 fL (ref 78.0–100.0)
Monocytes Absolute: 0.9 10*3/uL (ref 0.1–1.0)
Monocytes Relative: 16 % — ABNORMAL HIGH (ref 3–12)
NEUTROS ABS: 2.1 10*3/uL (ref 1.7–7.7)
Neutrophils Relative %: 38 % — ABNORMAL LOW (ref 43–77)
Platelets: 121 10*3/uL — ABNORMAL LOW (ref 150–400)
RBC: 4.11 MIL/uL — ABNORMAL LOW (ref 4.22–5.81)
RDW: 14.9 % (ref 11.5–15.5)
WBC: 5.5 10*3/uL (ref 4.0–10.5)

## 2013-08-07 LAB — URINALYSIS, ROUTINE W REFLEX MICROSCOPIC
Bilirubin Urine: NEGATIVE
Glucose, UA: NEGATIVE mg/dL
Hgb urine dipstick: NEGATIVE
KETONES UR: NEGATIVE mg/dL
LEUKOCYTES UA: NEGATIVE
NITRITE: NEGATIVE
Protein, ur: NEGATIVE mg/dL
SPECIFIC GRAVITY, URINE: 1.013 (ref 1.005–1.030)
UROBILINOGEN UA: 0.2 mg/dL (ref 0.0–1.0)
pH: 6 (ref 5.0–8.0)

## 2013-08-07 LAB — BASIC METABOLIC PANEL
BUN: 23 mg/dL (ref 6–23)
CHLORIDE: 101 meq/L (ref 96–112)
CO2: 28 mEq/L (ref 19–32)
CREATININE: 1.21 mg/dL (ref 0.50–1.35)
Calcium: 8.3 mg/dL — ABNORMAL LOW (ref 8.4–10.5)
GFR calc non Af Amer: 49 mL/min — ABNORMAL LOW (ref >90)
GFR, EST AFRICAN AMERICAN: 57 mL/min — AB (ref >90)
Glucose, Bld: 61 mg/dL — ABNORMAL LOW (ref 70–99)
POTASSIUM: 3.7 meq/L (ref 3.7–5.3)
Sodium: 140 mEq/L (ref 137–147)

## 2013-08-07 LAB — GLUCOSE, CAPILLARY: GLUCOSE-CAPILLARY: 120 mg/dL — AB (ref 70–99)

## 2013-08-07 MED ORDER — DEXTROMETHORPHAN HBR 15 MG/5ML PO SYRP: 10.0000 mL | ORAL_SOLUTION | Freq: Four times a day (QID) | ORAL | Status: DC | PRN

## 2013-08-07 MED ORDER — OSELTAMIVIR PHOSPHATE 75 MG PO CAPS: 75.0000 mg | ORAL_CAPSULE | Freq: Two times a day (BID) | ORAL | Status: DC

## 2013-08-07 MED ORDER — OSELTAMIVIR PHOSPHATE 75 MG PO CAPS
75.0000 mg | ORAL_CAPSULE | Freq: Once | ORAL | Status: AC
  Administered 2013-08-07: 75 mg via ORAL
  Filled 2013-08-07: qty 1

## 2013-08-07 MED ORDER — FUROSEMIDE 40 MG PO TABS: 40.0000 mg | ORAL_TABLET | Freq: Two times a day (BID) | ORAL | Status: DC

## 2013-08-07 NOTE — ED Notes (Signed)
Bed: WA01 Expected date:  Expected time:  Means of arrival:  Comments: EMS 

## 2013-08-07 NOTE — ED Notes (Signed)
Patient transported to X-ray 

## 2013-08-07 NOTE — ED Notes (Signed)
Per EMS, patient picked up at PCP, was evaluated for nagging non productive cough that started on Saturday. Patient was hypotensive, and hypoglycemic at MD office. Patient CBG 58 at MD, was given juice, recheck of 108, with EMS CBG 91. Family states to EMS that patient has also had difficulty with urination.

## 2013-08-07 NOTE — ED Notes (Signed)
[facility] went to [facility]CP at the insistence of his daughter. Patient was diagnosed with flu. Patient has no complaints at this time. Patient daughter has called and states she will be on her way. Patient updated.

## 2013-08-07 NOTE — ED Provider Notes (Signed)
CSN: [id number]     Arrival date & time 08/07/13  1302 History   First MD Initiated Contact with Patient 08/07/13 1350     Chief Complaint  Patient presents with  . Cough    + flu at MD  . Hypotension  . Urinary Retention  . Hypoglycemia    58 at MD, was given juice, went up to 108, 91 with EMS   (Consider location/radiation/quality/duration/timing/severity/associated sxs/prior Treatment) Patient is a 80 y.o. male presenting with cough and hypoglycemia. The history is provided by the patient.  Cough Hypoglycemia  He is here for evaluation of confusion, hypoglycemia, and hypotension. The patient cannot give history. His daughter states that he has been more confused than usual for one day. He was found wandering in the cafeteria at the facility, where he lives, thinking  That it was breakfast time at 10 PM at night. Later, he was found wandering in the hallway in his underwear; and this is unusual. He saw his PCP, today, and was diagnosed with influenza, by a nasal swab, this morning.    Level V caveat- confusion  Past Medical History  Diagnosis Date  . Hypertension   . Diabetes mellitus   . Thyroid disease   . High cholesterol   . Atrial fibrillation   . CHF (congestive heart failure)   . Emphysema     35% of lungs   Past Surgical History  Procedure Laterality Date  . Carotid stent      on blood thinner for stent  . Replacement total knee      left knee  . Appendectomy     History reviewed. No pertinent family history. History  Substance Use Topics  . Smoking status: Former Smoker -- 3.00 packs/day    Types: Cigarettes  . Smokeless tobacco: Never Used  . Alcohol Use: No    Review of Systems  Respiratory: Positive for cough.   All other systems reviewed and are negative.    Allergies  Demerol; Diltiazem; Penicillins; and Sulfa drugs cross reactors  Home Medications   Current Outpatient Rx  Name  Route  Sig  Dispense  Refill  . albuterol (PROVENTIL) (2.5  MG/3ML) 0.083% nebulizer solution   Nebulization   Take 2.5 mg by nebulization every 6 (six) hours as needed for wheezing or shortness of breath.         Marland Kitchen. aspirin EC 81 MG tablet   Oral   Take 81 mg by mouth daily.         . clopidogrel (PLAVIX) 75 MG tablet   Oral   Take 75 mg by mouth daily.         . furosemide (LASIX) 40 MG tablet   Oral   Take 40 mg by mouth daily.          Marland Kitchen. glimepiride (AMARYL) 2 MG tablet   Oral   Take 2 mg by mouth 2 (two) times daily.         . insulin glargine (LANTUS) 100 UNIT/ML injection   Subcutaneous   Inject 0.1 mLs (10 Units total) into the skin at bedtime.   10 mL   12   . isosorbide mononitrate (IMDUR) 60 MG 24 hr tablet   Oral   Take 60 mg by mouth daily.         Marland Kitchen. levothyroxine (SYNTHROID, LEVOTHROID) 50 MCG tablet   Oral   Take 50 mcg by mouth daily before breakfast.         .  lisinopril (ZESTRIL) 2.5 MG tablet   Oral   Take 1 tablet (2.5 mg total) by mouth daily.   30 tablet   1     HOLD FOR SBP LESS THAN 110   . LORazepam (ATIVAN) 0.5 MG tablet   Oral   Take 1 tablet (0.5 mg total) by mouth at bedtime.   30 tablet   0   . lovastatin (MEVACOR) 20 MG tablet   Oral   Take 20 mg by mouth at bedtime.         . potassium chloride SA (K-DUR,KLOR-CON) 20 MEQ tablet   Oral   Take 20 mEq by mouth daily.         Marland Kitchen EXPIRED: albuterol (PROVENTIL) (5 MG/ML) 0.5% nebulizer solution   Nebulization   Take 0.5 mLs (2.5 mg total) by nebulization every 4 (four) hours as needed for wheezing or shortness of breath.   20 mL      . dextromethorphan 15 MG/5ML syrup   Oral   Take 10 mLs (30 mg total) by mouth 4 (four) times daily as needed for cough.   120 mL   0   . EXPIRED: furosemide (LASIX) 40 MG tablet   Oral   Take 1 tablet (40 mg total) by mouth 2 (two) times daily.   30 tablet      . furosemide (LASIX) 40 MG tablet   Oral   Take 1 tablet (40 mg total) by mouth 2 (two) times daily.   60 tablet    0   . EXPIRED: insulin glargine (LANTUS) 100 UNIT/ML injection   Subcutaneous   Inject 10 Units into the skin daily.   10 mL      . EXPIRED: ipratropium (ATROVENT) 0.02 % nebulizer solution   Nebulization   Take 2.5 mLs (0.5 mg total) by nebulization every 6 (six) hours as needed.   75 mL      . oseltamivir (TAMIFLU) 75 MG capsule   Oral   Take 1 capsule (75 mg total) by mouth every 12 (twelve) hours.   10 capsule   0    BP 101/73  Pulse 75  Temp(Src) 99 F (37.2 C) (Rectal)  Resp 20  Wt 162 lb (73.483 kg)  SpO2 95% Physical Exam  Nursing note and vitals reviewed. Constitutional: He is oriented to person, place, and time. He appears well-developed and well-nourished.  HENT:  Head: Normocephalic and atraumatic.  Right Ear: External ear normal.  Left Ear: External ear normal.  Eyes: Conjunctivae and EOM are normal. Pupils are equal, round, and reactive to light.  Neck: Normal range of motion and phonation normal. Neck supple.  Cardiovascular: Normal rate, regular rhythm, normal heart sounds and intact distal pulses.   Pulmonary/Chest: Effort normal and breath sounds normal. He exhibits no bony tenderness.  Abdominal: Soft. Normal appearance. There is no tenderness.  Musculoskeletal: Normal range of motion.  Mild edema right foot without evidence for infection.  Neurological: He is alert and oriented to person, place, and time. No cranial nerve deficit or sensory deficit. He exhibits normal muscle tone. Coordination normal.  Skin: Skin is warm, dry and intact.  Psychiatric: He has a normal mood and affect. His behavior is normal. Judgment and thought content normal.    ED Course  Procedures (including critical care time)  Medications  oseltamivir (TAMIFLU) capsule 75 mg (not administered)    Patient Vitals for the past 24 hrs:  BP Temp Temp src Pulse Resp SpO2 Weight  08/07/13 1749 - - - - - -  162 lb (73.483 kg)  08/07/13 1457 - 99 F (37.2 C) Rectal - - - -   08/07/13 1309 101/73 mmHg 98.2 F (36.8 C) Oral 75 20 95 % -  08/07/13 1303 - - - - - 95 % -    5:33 PM Reevaluation with update and discussion. After initial assessment and treatment, an updated evaluation reveals he remains comfortable. He has eaten.Mancel Bale L   His weight today is 4 kg higher than December 2014  Findings discussed with patient and daughter. He, states that for the last month. He has been taking his Lasix only once a day because it was causing dysuria. This is the likely source for his mild congestive heart failure  Labs Review Labs Reviewed  CBC WITH DIFFERENTIAL - Abnormal; Notable for the following:    RBC 4.11 (*)    Hemoglobin 11.7 (*)    HCT 35.7 (*)    Platelets 121 (*)    Neutrophils Relative % 38 (*)    Monocytes Relative 16 (*)    All other components within normal limits  BASIC METABOLIC PANEL - Abnormal; Notable for the following:    Glucose, Bld 61 (*)    Calcium 8.3 (*)    GFR calc non Af Amer 49 (*)    GFR calc Af Amer 57 (*)    All other components within normal limits  GLUCOSE, CAPILLARY - Abnormal; Notable for the following:    Glucose-Capillary 120 (*)    All other components within normal limits  URINE CULTURE  URINALYSIS, ROUTINE W REFLEX MICROSCOPIC   Imaging Review Dg Chest 2 View  08/07/2013   CLINICAL DATA:  Shortness of breath with coughing and wheezing. History of asthma.  EXAM: CHEST  2 VIEW  COMPARISON:  06/21/2013 and 10/22/2011.  FINDINGS: Cardiomegaly and aortic atherosclerosis are stable. There are increased interstitial opacities throughout both lungs. Patchy basilar components likely represent atelectasis. There is no consolidation or significant pleural effusion. The degenerative changes involving the thoracic spine and glenohumeral joints are stable.  IMPRESSION: Further progression in diffuse interstitial prominence may represent progressive interstitial edema. Alternatively, this could reflect fibrosis or atypical  infection. High-resolution chest CT may be helpful for further evaluation.   Electronically Signed   By: Roxy Horseman M.D.   On: 08/07/2013 15:13    EKG Interpretation   None       MDM   1. Influenza   2. Hypoglycemia   3. Congestive heart failure      Transient hypoglycemia, and hypotension with new diagnosis of influenza. He is hemodynamically stable, now. His weight is increased about 4 kg from his recent baseline. He is taking his furosemide less than prescribed. He is stable for outpatient management.  Nursing Notes Reviewed/ Care Coordinated Applicable Imaging Reviewed Interpretation of Laboratory Data incorporated into ED treatment  The patient appears reasonably screened and/or stabilized for discharge and I doubt any other medical condition or other [facility] requiring further screening, evaluation, or treatment in the ED at this time prior to discharge.  Plan: Home Medications- take regular lasix dose , Tamiflu, Robitussin DM; Home Treatments- rest; return here if the recommended treatment, does not improve the symptoms; Recommended follow up- PCP 1 week    Flint Melter, MD 08/07/13 1810

## 2013-08-08 LAB — URINE CULTURE
CULTURE: NO GROWTH
Colony Count: NO GROWTH

## 2013-09-03 ENCOUNTER — Emergency Department (HOSPITAL_COMMUNITY)
Admission: EM | Admit: 2013-09-03 | Discharge: 2013-09-03 | Disposition: A | Discharge status: Skilled Nursing Facility | Payer: Medicare Other | Attending: Emergency Medicine | Admitting: Emergency Medicine

## 2013-09-03 ENCOUNTER — Encounter (HOSPITAL_COMMUNITY): Payer: Self-pay | Admitting: Emergency Medicine

## 2013-09-03 DIAGNOSIS — Z7902 Long term (current) use of antithrombotics/antiplatelets: Secondary | ICD-10-CM | POA: Insufficient documentation

## 2013-09-03 DIAGNOSIS — I10 Essential (primary) hypertension: Secondary | ICD-10-CM | POA: Insufficient documentation

## 2013-09-03 DIAGNOSIS — Z79899 Other long term (current) drug therapy: Secondary | ICD-10-CM | POA: Insufficient documentation

## 2013-09-03 DIAGNOSIS — E7800 Pure hypercholesterolemia, unspecified: Secondary | ICD-10-CM | POA: Insufficient documentation

## 2013-09-03 DIAGNOSIS — I4891 Unspecified atrial fibrillation: Secondary | ICD-10-CM | POA: Insufficient documentation

## 2013-09-03 DIAGNOSIS — Z87891 Personal history of nicotine dependence: Secondary | ICD-10-CM | POA: Insufficient documentation

## 2013-09-03 DIAGNOSIS — Z794 Long term (current) use of insulin: Secondary | ICD-10-CM | POA: Insufficient documentation

## 2013-09-03 DIAGNOSIS — R319 Hematuria, unspecified: Secondary | ICD-10-CM

## 2013-09-03 DIAGNOSIS — Z7982 Long term (current) use of aspirin: Secondary | ICD-10-CM | POA: Insufficient documentation

## 2013-09-03 DIAGNOSIS — Z880 Allergy status to penicillin: Secondary | ICD-10-CM | POA: Insufficient documentation

## 2013-09-03 DIAGNOSIS — I509 Heart failure, unspecified: Secondary | ICD-10-CM | POA: Insufficient documentation

## 2013-09-03 DIAGNOSIS — Z9861 Coronary angioplasty status: Secondary | ICD-10-CM | POA: Insufficient documentation

## 2013-09-03 DIAGNOSIS — E119 Type 2 diabetes mellitus without complications: Secondary | ICD-10-CM | POA: Insufficient documentation

## 2013-09-03 DIAGNOSIS — E079 Disorder of thyroid, unspecified: Secondary | ICD-10-CM | POA: Insufficient documentation

## 2013-09-03 LAB — URINALYSIS, ROUTINE W REFLEX MICROSCOPIC
Bilirubin Urine: NEGATIVE
GLUCOSE, UA: NEGATIVE mg/dL
Ketones, ur: NEGATIVE mg/dL
Nitrite: NEGATIVE
PH: 6 (ref 5.0–8.0)
Protein, ur: >300 mg/dL — AB
SPECIFIC GRAVITY, URINE: 1.003 — AB (ref 1.005–1.030)
Urobilinogen, UA: 0.2 mg/dL (ref 0.0–1.0)

## 2013-09-03 LAB — BASIC METABOLIC PANEL
BUN: 30 mg/dL — AB (ref 6–23)
CO2: 28 meq/L (ref 19–32)
CREATININE: 1.35 mg/dL (ref 0.50–1.35)
Calcium: 9.1 mg/dL (ref 8.4–10.5)
Chloride: 96 mEq/L (ref 96–112)
GFR calc Af Amer: 50 mL/min — ABNORMAL LOW (ref >90)
GFR calc non Af Amer: 43 mL/min — ABNORMAL LOW (ref >90)
GLUCOSE: 176 mg/dL — AB (ref 70–99)
POTASSIUM: 4 meq/L (ref 3.7–5.3)
Sodium: 135 mEq/L — ABNORMAL LOW (ref 137–147)

## 2013-09-03 LAB — PROTIME-INR
INR: 1.05 (ref 0.00–1.49)
Prothrombin Time: 13.5 seconds (ref 11.6–15.2)

## 2013-09-03 LAB — CBC WITH DIFFERENTIAL/PLATELET
BASOS PCT: 0 % (ref 0–1)
Basophils Absolute: 0 10*3/uL (ref 0.0–0.1)
Eosinophils Absolute: 0.2 10*3/uL (ref 0.0–0.7)
Eosinophils Relative: 2 % (ref 0–5)
HEMATOCRIT: 36.1 % — AB (ref 39.0–52.0)
HEMOGLOBIN: 12.3 g/dL — AB (ref 13.0–17.0)
LYMPHS ABS: 2.4 10*3/uL (ref 0.7–4.0)
LYMPHS PCT: 17 % (ref 12–46)
MCH: 29 pg (ref 26.0–34.0)
MCHC: 34.1 g/dL (ref 30.0–36.0)
MCV: 85.1 fL (ref 78.0–100.0)
MONO ABS: 1 10*3/uL (ref 0.1–1.0)
MONOS PCT: 7 % (ref 3–12)
NEUTROS ABS: 10.1 10*3/uL — AB (ref 1.7–7.7)
Neutrophils Relative %: 74 % (ref 43–77)
Platelets: 156 10*3/uL (ref 150–400)
RBC: 4.24 MIL/uL (ref 4.22–5.81)
RDW: 14.4 % (ref 11.5–15.5)
WBC: 13.7 10*3/uL — ABNORMAL HIGH (ref 4.0–10.5)

## 2013-09-03 LAB — URINE MICROSCOPIC-ADD ON

## 2013-09-03 MED ORDER — CEPHALEXIN 500 MG PO CAPS: 500.0000 mg | ORAL_CAPSULE | Freq: Four times a day (QID) | ORAL | Status: DC

## 2013-09-03 MED ORDER — CEPHALEXIN 500 MG PO CAPS
500.0000 mg | ORAL_CAPSULE | Freq: Once | ORAL | Status: AC
  Administered 2013-09-03: 500 mg via ORAL
  Filled 2013-09-03: qty 1

## 2013-09-03 NOTE — ED Notes (Signed)
Per EMS-[facility] Patient had a foley placed 2 days ago in the ECF and EMS reported that there was blood when intially placed and increased blood today.

## 2013-09-03 NOTE — ED Notes (Signed)
Bed: [id number] Expected date: 09/03/13 Expected time: 10:46 AM Means of arrival: Ambulance Comments: Blood around foley

## 2013-09-03 NOTE — ED Notes (Signed)
Pt has dark blood in foley bag, irrigation set up to flush cath till clear. Without changing cath, irrigated from drainage port after cleaning hub entrance. All together 900 ml sterile water inserted at intervals and released until clear. Small frank clots in the beginning, slight pink solution noted by time irrigation complete, Cath secured with legs bag strap for comfort, Dr Blinda LeatherwoodPollina aware of results, continue to monitor urine while waiting for lab results.

## 2013-09-03 NOTE — ED Provider Notes (Signed)
CSN: [id number]     Arrival date & time 09/03/13  1048 History   First MD Initiated Contact with Patient 09/03/13 1054     Chief Complaint  Patient presents with  . blood in foley      (Consider location/radiation/quality/duration/timing/severity/associated sxs/prior Treatment) HPI Comments: Patient presents to the ER for evaluation of hematuria. Patient was seen in the ER 2 days ago with multiple problems, including urinary retention. A Foley catheter was placed during the visit and he was sent home with the catheter in place. Patient reports that since he went home, he has noticed blood in the urine. He is not experiencing any penile pain, pelvic pain, back pain.   Past Medical History  Diagnosis Date  . Hypertension   . Diabetes mellitus   . Thyroid disease   . High cholesterol   . Atrial fibrillation   . CHF (congestive heart failure)   . Emphysema     35% of lungs   Past Surgical History  Procedure Laterality Date  . Carotid stent      on blood thinner for stent  . Replacement total knee      left knee  . Appendectomy     History reviewed. No pertinent family history. History  Substance Use Topics  . Smoking status: Former Smoker -- 3.00 packs/day    Types: Cigarettes  . Smokeless tobacco: Never Used  . Alcohol Use: No    Review of Systems  Gastrointestinal: Negative for abdominal pain.  Genitourinary: Positive for hematuria. Negative for penile swelling, penile pain and testicular pain.  All other systems reviewed and are negative.      Allergies  Demerol; Diltiazem; Penicillins; and Sulfa drugs cross reactors  Home Medications   Current Outpatient Rx  Name  Route  Sig  Dispense  Refill  . aspirin EC 81 MG tablet   Oral   Take 81 mg by mouth daily.         . Camphor (JOINTFLEX) 3.1 % CREA   Apply externally   Apply 1 application topically 2 (two) times daily as needed (joint pain).         Marland Kitchen. clopidogrel (PLAVIX) 75 MG tablet   Oral   Take 75  mg by mouth daily.         . furosemide (LASIX) 40 MG tablet   Oral   Take 1 tablet (40 mg total) by mouth 2 (two) times daily.   60 tablet   0   . glimepiride (AMARYL) 2 MG tablet   Oral   Take 2 mg by mouth 2 (two) times daily.         . insulin glargine (LANTUS) 100 UNIT/ML injection   Subcutaneous   Inject 0.1 mLs (10 Units total) into the skin at bedtime.   10 mL   12   . ipratropium (ATROVENT) 0.02 % nebulizer solution   Nebulization   Take 0.5 mg by nebulization 4 (four) times daily.         . isosorbide mononitrate (IMDUR) 60 MG 24 hr tablet   Oral   Take 60 mg by mouth daily.         Marland Kitchen. levothyroxine (SYNTHROID, LEVOTHROID) 50 MCG tablet   Oral   Take 50 mcg by mouth daily before breakfast.         . lisinopril (ZESTRIL) 2.5 MG tablet   Oral   Take 1 tablet (2.5 mg total) by mouth daily.   30 tablet   1  HOLD FOR SBP LESS THAN 110   . LORazepam (ATIVAN) 0.5 MG tablet   Oral   Take 1 tablet (0.5 mg total) by mouth at bedtime.   30 tablet   0   . lovastatin (MEVACOR) 20 MG tablet   Oral   Take 20 mg by mouth at bedtime.         Marland Kitchen OVER THE COUNTER MEDICATION   Topical   Apply 1 application topically. 3 to 4 times daily as needed for arthritis pain (Blue Star Oint)         . Polyethyl Glycol-Propyl Glycol (SYSTANE) 0.4-0.3 % SOLN   Both Eyes   Place 1-2 drops into both eyes as needed (dry eyes).         . polyethylene glycol (MIRALAX / GLYCOLAX) packet   Oral   Take 17 g by mouth daily as needed for mild constipation.         . potassium chloride SA (K-DUR,KLOR-CON) 20 MEQ tablet   Oral   Take 20 mEq by mouth daily.         Marland Kitchen senna (SENOKOT) 8.6 MG TABS tablet   Oral   Take 1 tablet by mouth at bedtime as needed for mild constipation.         Marland Kitchen albuterol (PROVENTIL) (2.5 MG/3ML) 0.083% nebulizer solution   Nebulization   Take 2.5 mg by nebulization every 6 (six) hours as needed for wheezing or shortness of breath.          . EXPIRED: insulin glargine (LANTUS) 100 UNIT/ML injection   Subcutaneous   Inject 10 Units into the skin daily.   10 mL       BP 92/50  Pulse 71  SpO2 99% Physical Exam  Constitutional: He is oriented to person, place, and time. He appears well-developed and well-nourished. No distress.  HENT:  Head: Normocephalic and atraumatic.  Right Ear: Hearing normal.  Left Ear: Hearing normal.  Nose: Nose normal.  Mouth/Throat: Oropharynx is clear and moist and mucous membranes are normal.  Eyes: Conjunctivae and EOM are normal. Pupils are equal, round, and reactive to light.  Neck: Normal range of motion. Neck supple.  Cardiovascular: Regular rhythm, S1 normal and S2 normal.  Exam reveals no gallop and no friction rub.   No murmur heard. Pulmonary/Chest: Effort normal and breath sounds normal. No respiratory distress. He exhibits no tenderness.  Abdominal: Soft. Normal appearance and bowel sounds are normal. There is no hepatosplenomegaly. There is no tenderness. There is no rebound, no guarding, no tenderness at McBurney's point and negative Murphy's sign. No hernia.  Musculoskeletal: Normal range of motion.  Neurological: He is alert and oriented to person, place, and time. He has normal strength. No cranial nerve deficit or sensory deficit. Coordination normal. GCS eye subscore is 4. GCS verbal subscore is 5. GCS motor subscore is 6.  Skin: Skin is warm, dry and intact. No rash noted. No cyanosis.  Psychiatric: He has a normal mood and affect. His speech is normal and behavior is normal. Thought content normal.    ED Course  Procedures (including critical care time) Labs Review Labs Reviewed  CBC WITH DIFFERENTIAL - Abnormal; Notable for the following:    WBC 13.7 (*)    Hemoglobin 12.3 (*)    HCT 36.1 (*)    Neutro Abs 10.1 (*)    All other components within normal limits  BASIC METABOLIC PANEL - Abnormal; Notable for the following:    Sodium 135 (*)  Glucose, Bld 176  (*)    BUN 30 (*)    GFR calc non Af Amer 43 (*)    GFR calc Af Amer 50 (*)    All other components within normal limits  URINALYSIS, ROUTINE W REFLEX MICROSCOPIC - Abnormal; Notable for the following:    Color, Urine AMBER (*)    Specific Gravity, Urine 1.003 (*)    Hgb urine dipstick LARGE (*)    Protein, ur >300 (*)    Leukocytes, UA MODERATE (*)    All other components within normal limits  URINE CULTURE  PROTIME-INR  URINE MICROSCOPIC-ADD ON   Imaging Review No results found.   EKG Interpretation None      MDM   Final diagnoses:  None    Patient presents to the ER for evaluation blood in his urine. He was recently seen for urinary retention and had a Foley catheter placed. Since then he has been having bleeding. Patient does take aspirin and Plavix.   Patient's was replaced and the bladder irrigated. No ongoing bleeding.  Urinalysis is equivocal. Culture pending. Will initiate antibiotic coverage with a Foley catheter in place. Patient is to followup with his urologist this week in the office.  Gilda Crease, MD 09/03/13 [id number]

## 2013-09-03 NOTE — Discharge Instructions (Signed)
Hematuria, Adult °Hematuria is blood in your urine. It can be caused by a bladder infection, kidney infection, prostate infection, kidney stone, or cancer of your urinary tract. Infections can usually be treated with medicine, and a kidney stone usually will pass through your urine. If neither of these is the cause of your hematuria, further workup to find out the reason may be needed. °It is very important that you tell your health care provider about any blood you see in your urine, even if the blood stops without treatment or happens without causing pain. Blood in your urine that happens and then stops and then happens again can be a symptom of a very serious condition. Also, pain is not a symptom in the initial stages of many urinary cancers. °HOME CARE INSTRUCTIONS  °· Drink lots of fluid, 3 4 quarts a day. If you have been diagnosed with an infection, cranberry juice is especially recommended, in addition to large amounts of water. °· Avoid caffeine, tea, and carbonated beverages, because they tend to irritate the bladder. °· Avoid alcohol because it may irritate the prostate. °· Only take over-the-counter or prescription medicines for pain, discomfort, or fever as directed by your health care provider. °· If you have been diagnosed with a kidney stone, follow your health care provider's instructions regarding straining your urine to catch the stone. °· Empty your bladder often. Avoid holding urine for long periods of time. °· After a bowel movement, women should cleanse front to back. Use each tissue only once. °· Empty your bladder before and after sexual intercourse if you are a male. °SEEK MEDICAL CARE IF: °You develop back pain, fever, a feeling of sickness in your stomach (nausea), or vomiting or if your symptoms are not better in 3 days. Return sooner if you are getting worse. °SEEK IMMEDIATE MEDICAL CARE IF:  °· You have a persistent fever, with a temperature of 101.8°F (38.8°C) or greater. °· You  develop severe vomiting and are unable to keep the medicine down. °· You develop severe back or abdominal pain despite taking your medicines. °· You begin passing a large amount of blood or clots in your urine. °· You feel extremely weak or faint, or you pass out. °MAKE SURE YOU:  °· Understand these instructions. °· Will watch your condition. °· Will get help right away if you are not doing well or get worse. °Document Released: 06/22/2005 Document Revised: 04/12/2013 Document Reviewed: 02/20/2013 °[employer]® Patient Information ©2014 [employer]. ° °

## 2013-09-04 LAB — URINE CULTURE
COLONY COUNT: NO GROWTH
Culture: NO GROWTH

## 2013-12-28 ENCOUNTER — Encounter (HOSPITAL_COMMUNITY): Payer: Self-pay | Admitting: Emergency Medicine

## 2013-12-28 ENCOUNTER — Emergency Department (HOSPITAL_COMMUNITY): Payer: Medicare Other

## 2013-12-28 ENCOUNTER — Inpatient Hospital Stay (HOSPITAL_COMMUNITY)
Admission: EM | Admit: 2013-12-28 | Discharge: 2014-01-05 | DRG: 871 | Disposition: A | Discharge status: Skilled Nursing Facility | Payer: Medicare Other | Attending: Internal Medicine | Admitting: Internal Medicine

## 2013-12-28 ENCOUNTER — Emergency Department (HOSPITAL_COMMUNITY)
Admission: EM | Admit: 2013-12-28 | Discharge: 2013-12-28 | Disposition: A | Discharge status: Skilled Nursing Facility | Payer: Medicare Other | Source: Home / Self Care | Attending: Emergency Medicine | Admitting: Emergency Medicine

## 2013-12-28 DIAGNOSIS — Z87891 Personal history of nicotine dependence: Secondary | ICD-10-CM

## 2013-12-28 DIAGNOSIS — R319 Hematuria, unspecified: Secondary | ICD-10-CM

## 2013-12-28 DIAGNOSIS — I10 Essential (primary) hypertension: Secondary | ICD-10-CM

## 2013-12-28 DIAGNOSIS — E119 Type 2 diabetes mellitus without complications: Secondary | ICD-10-CM | POA: Diagnosis present

## 2013-12-28 DIAGNOSIS — Z9861 Coronary angioplasty status: Secondary | ICD-10-CM | POA: Insufficient documentation

## 2013-12-28 DIAGNOSIS — R339 Retention of urine, unspecified: Secondary | ICD-10-CM | POA: Insufficient documentation

## 2013-12-28 DIAGNOSIS — J439 Emphysema, unspecified: Secondary | ICD-10-CM | POA: Insufficient documentation

## 2013-12-28 DIAGNOSIS — N138 Other obstructive and reflux uropathy: Secondary | ICD-10-CM | POA: Diagnosis present

## 2013-12-28 DIAGNOSIS — Z7902 Long term (current) use of antithrombotics/antiplatelets: Secondary | ICD-10-CM

## 2013-12-28 DIAGNOSIS — D62 Acute posthemorrhagic anemia: Secondary | ICD-10-CM | POA: Diagnosis present

## 2013-12-28 DIAGNOSIS — G9340 Encephalopathy, unspecified: Secondary | ICD-10-CM | POA: Diagnosis present

## 2013-12-28 DIAGNOSIS — D72829 Elevated white blood cell count, unspecified: Secondary | ICD-10-CM | POA: Diagnosis present

## 2013-12-28 DIAGNOSIS — Z885 Allergy status to narcotic agent status: Secondary | ICD-10-CM

## 2013-12-28 DIAGNOSIS — Z7982 Long term (current) use of aspirin: Secondary | ICD-10-CM

## 2013-12-28 DIAGNOSIS — E7800 Pure hypercholesterolemia, unspecified: Secondary | ICD-10-CM | POA: Diagnosis present

## 2013-12-28 DIAGNOSIS — Z880 Allergy status to penicillin: Secondary | ICD-10-CM

## 2013-12-28 DIAGNOSIS — Z79899 Other long term (current) drug therapy: Secondary | ICD-10-CM

## 2013-12-28 DIAGNOSIS — I4891 Unspecified atrial fibrillation: Secondary | ICD-10-CM | POA: Insufficient documentation

## 2013-12-28 DIAGNOSIS — N390 Urinary tract infection, site not specified: Secondary | ICD-10-CM

## 2013-12-28 DIAGNOSIS — Z66 Do not resuscitate: Secondary | ICD-10-CM | POA: Diagnosis present

## 2013-12-28 DIAGNOSIS — E039 Hypothyroidism, unspecified: Secondary | ICD-10-CM | POA: Diagnosis present

## 2013-12-28 DIAGNOSIS — N179 Acute kidney failure, unspecified: Secondary | ICD-10-CM

## 2013-12-28 DIAGNOSIS — K5900 Constipation, unspecified: Secondary | ICD-10-CM

## 2013-12-28 DIAGNOSIS — Z794 Long term (current) use of insulin: Secondary | ICD-10-CM

## 2013-12-28 DIAGNOSIS — R000 Tachycardia, unspecified: Secondary | ICD-10-CM | POA: Diagnosis present

## 2013-12-28 DIAGNOSIS — I509 Heart failure, unspecified: Secondary | ICD-10-CM | POA: Insufficient documentation

## 2013-12-28 DIAGNOSIS — R1310 Dysphagia, unspecified: Secondary | ICD-10-CM | POA: Diagnosis present

## 2013-12-28 DIAGNOSIS — F05 Delirium due to known physiological condition: Secondary | ICD-10-CM | POA: Diagnosis present

## 2013-12-28 DIAGNOSIS — F411 Generalized anxiety disorder: Secondary | ICD-10-CM | POA: Diagnosis present

## 2013-12-28 DIAGNOSIS — Z882 Allergy status to sulfonamides status: Secondary | ICD-10-CM

## 2013-12-28 DIAGNOSIS — E876 Hypokalemia: Secondary | ICD-10-CM

## 2013-12-28 DIAGNOSIS — Q619 Cystic kidney disease, unspecified: Secondary | ICD-10-CM

## 2013-12-28 DIAGNOSIS — E079 Disorder of thyroid, unspecified: Secondary | ICD-10-CM | POA: Diagnosis present

## 2013-12-28 DIAGNOSIS — R6510 Systemic inflammatory response syndrome (SIRS) of non-infectious origin without acute organ dysfunction: Secondary | ICD-10-CM | POA: Diagnosis present

## 2013-12-28 DIAGNOSIS — N200 Calculus of kidney: Secondary | ICD-10-CM | POA: Diagnosis present

## 2013-12-28 DIAGNOSIS — A419 Sepsis, unspecified organism: Secondary | ICD-10-CM

## 2013-12-28 DIAGNOSIS — E118 Type 2 diabetes mellitus with unspecified complications: Secondary | ICD-10-CM

## 2013-12-28 DIAGNOSIS — I2510 Atherosclerotic heart disease of native coronary artery without angina pectoris: Secondary | ICD-10-CM | POA: Diagnosis present

## 2013-12-28 DIAGNOSIS — Z515 Encounter for palliative care: Secondary | ICD-10-CM

## 2013-12-28 DIAGNOSIS — J438 Other emphysema: Secondary | ICD-10-CM | POA: Diagnosis present

## 2013-12-28 DIAGNOSIS — R6520 Severe sepsis without septic shock: Secondary | ICD-10-CM

## 2013-12-28 DIAGNOSIS — E785 Hyperlipidemia, unspecified: Secondary | ICD-10-CM | POA: Diagnosis present

## 2013-12-28 DIAGNOSIS — I959 Hypotension, unspecified: Secondary | ICD-10-CM

## 2013-12-28 DIAGNOSIS — J690 Pneumonitis due to inhalation of food and vomit: Secondary | ICD-10-CM | POA: Diagnosis present

## 2013-12-28 DIAGNOSIS — R443 Hallucinations, unspecified: Secondary | ICD-10-CM | POA: Diagnosis not present

## 2013-12-28 DIAGNOSIS — N401 Enlarged prostate with lower urinary tract symptoms: Secondary | ICD-10-CM | POA: Diagnosis present

## 2013-12-28 DIAGNOSIS — Z888 Allergy status to other drugs, medicaments and biological substances status: Secondary | ICD-10-CM

## 2013-12-28 DIAGNOSIS — R404 Transient alteration of awareness: Secondary | ICD-10-CM | POA: Diagnosis present

## 2013-12-28 DIAGNOSIS — H547 Unspecified visual loss: Secondary | ICD-10-CM | POA: Diagnosis present

## 2013-12-28 LAB — CBC WITH DIFFERENTIAL/PLATELET
Basophils Absolute: 0 10*3/uL (ref 0.0–0.1)
Basophils Relative: 0 % (ref 0–1)
EOS PCT: 1 % (ref 0–5)
Eosinophils Absolute: 0.1 10*3/uL (ref 0.0–0.7)
HCT: 35.2 % — ABNORMAL LOW (ref 39.0–52.0)
Hemoglobin: 11.7 g/dL — ABNORMAL LOW (ref 13.0–17.0)
LYMPHS PCT: 16 % (ref 12–46)
Lymphs Abs: 2.3 10*3/uL (ref 0.7–4.0)
MCH: 28.7 pg (ref 26.0–34.0)
MCHC: 33.2 g/dL (ref 30.0–36.0)
MCV: 86.5 fL (ref 78.0–100.0)
Monocytes Absolute: 0.9 10*3/uL (ref 0.1–1.0)
Monocytes Relative: 6 % (ref 3–12)
NEUTROS ABS: 11.4 10*3/uL — AB (ref 1.7–7.7)
Neutrophils Relative %: 77 % (ref 43–77)
PLATELETS: 193 10*3/uL (ref 150–400)
RBC: 4.07 MIL/uL — ABNORMAL LOW (ref 4.22–5.81)
RDW: 13.7 % (ref 11.5–15.5)
WBC: 14.8 10*3/uL — AB (ref 4.0–10.5)

## 2013-12-28 LAB — URINALYSIS, ROUTINE W REFLEX MICROSCOPIC
Glucose, UA: 100 mg/dL — AB
Ketones, ur: NEGATIVE mg/dL
Leukocytes, UA: NEGATIVE
Nitrite: POSITIVE — AB
PH: 7.5 (ref 5.0–8.0)
Protein, ur: >300 mg/dL — AB
Specific Gravity, Urine: 1.025 (ref 1.005–1.030)
Urobilinogen, UA: 0.2 mg/dL (ref 0.0–1.0)

## 2013-12-28 LAB — COMPREHENSIVE METABOLIC PANEL
ALT: 8 U/L (ref 0–53)
AST: 12 U/L (ref 0–37)
Albumin: 3.6 g/dL (ref 3.5–5.2)
Alkaline Phosphatase: 64 U/L (ref 39–117)
BUN: 28 mg/dL — ABNORMAL HIGH (ref 6–23)
CALCIUM: 8.9 mg/dL (ref 8.4–10.5)
CHLORIDE: 100 meq/L (ref 96–112)
CO2: 24 mEq/L (ref 19–32)
Creatinine, Ser: 0.99 mg/dL (ref 0.50–1.35)
GFR calc Af Amer: 79 mL/min — ABNORMAL LOW (ref >90)
GFR, EST NON AFRICAN AMERICAN: 68 mL/min — AB (ref >90)
Glucose, Bld: 216 mg/dL — ABNORMAL HIGH (ref 70–99)
Potassium: 4.2 mEq/L (ref 3.7–5.3)
SODIUM: 140 meq/L (ref 137–147)
Total Bilirubin: 0.6 mg/dL (ref 0.3–1.2)
Total Protein: 6.7 g/dL (ref 6.0–8.3)

## 2013-12-28 LAB — URINE MICROSCOPIC-ADD ON

## 2013-12-28 MED ORDER — ONDANSETRON HCL 4 MG/2ML IJ SOLN
4.0000 mg | Freq: Once | INTRAMUSCULAR | Status: AC
  Administered 2013-12-28: 4 mg via INTRAVENOUS
  Filled 2013-12-28: qty 2

## 2013-12-28 MED ORDER — OXYCODONE-ACETAMINOPHEN 5-325 MG PO TABS
1.0000 | ORAL_TABLET | Freq: Once | ORAL | Status: AC
  Administered 2013-12-28: 1 via ORAL
  Filled 2013-12-28: qty 1

## 2013-12-28 MED ORDER — LORAZEPAM 2 MG/ML IJ SOLN
0.5000 mg | Freq: Once | INTRAMUSCULAR | Status: AC
  Administered 2013-12-28: 0.5 mg via INTRAVENOUS
  Filled 2013-12-28: qty 1

## 2013-12-28 MED ORDER — LORAZEPAM 2 MG/ML IJ SOLN
INTRAMUSCULAR | Status: AC
  Filled 2013-12-28: qty 1

## 2013-12-28 MED ORDER — MORPHINE SULFATE 4 MG/ML IJ SOLN
4.0000 mg | Freq: Once | INTRAMUSCULAR | Status: AC
  Administered 2013-12-28: 4 mg via INTRAVENOUS
  Filled 2013-12-28: qty 1

## 2013-12-28 MED ORDER — MORPHINE SULFATE 2 MG/ML IJ SOLN
2.0000 mg | Freq: Once | INTRAMUSCULAR | Status: AC
  Administered 2013-12-28: 2 mg via INTRAVENOUS
  Filled 2013-12-28: qty 1

## 2013-12-28 MED ORDER — CEPHALEXIN 500 MG PO CAPS: 500.0000 mg | ORAL_CAPSULE | Freq: Four times a day (QID) | ORAL | Status: DC

## 2013-12-28 MED ORDER — OXYBUTYNIN CHLORIDE 5 MG PO TABS
2.5000 mg | ORAL_TABLET | Freq: Once | ORAL | Status: AC
  Administered 2013-12-28: 2.5 mg via ORAL
  Filled 2013-12-28: qty 0.5

## 2013-12-28 MED ORDER — OXYBUTYNIN CHLORIDE 5 MG PO TABS: 2.5000 mg | ORAL_TABLET | Freq: Once | ORAL | Status: DC

## 2013-12-28 MED ORDER — BELLADONNA ALKALOIDS-OPIUM 16.2-60 MG RE SUPP
1.0000 | RECTAL | Status: AC
  Administered 2013-12-28: 1 via RECTAL
  Filled 2013-12-28: qty 1

## 2013-12-28 MED ORDER — FENTANYL CITRATE 0.05 MG/ML IJ SOLN
50.0000 ug | Freq: Once | INTRAMUSCULAR | Status: AC
  Administered 2013-12-28: 50 ug via INTRAVENOUS
  Filled 2013-12-28: qty 2

## 2013-12-28 NOTE — ED Provider Notes (Signed)
CSN: [id number]     Arrival date & time 12/28/13  2246 History   First MD Initiated Contact with Patient 12/28/13 2300     Chief Complaint  Patient presents with  . Hematuria     (Consider location/radiation/quality/duration/timing/severity/associated sxs/prior Treatment) HPI Comments: 80 yo male with history of urinary retention who presents with gross hematuria.  Started this morning.  He states the urine in his foley bag was clear, but he had blood in hit foley tubing.  He came to the ED and had a new urinary catheter placed.  Since that time, he states he has had gross hematuria. The pain that he had when he presented to the ED this morning has since resolved.    Patient is a 80 y.o. male presenting with hematuria.  Hematuria This is a recurrent problem. Episode onset: today. The problem occurs constantly. The problem has not changed since onset.Associated symptoms include abdominal pain. Pertinent negatives include no chest pain and no shortness of breath. Nothing aggravates the symptoms. Nothing relieves the symptoms.    Past Medical History  Diagnosis Date  . Hypertension   . Diabetes mellitus   . Thyroid disease   . High cholesterol   . Atrial fibrillation   . CHF (congestive heart failure)   . Emphysema     35% of lungs   Past Surgical History  Procedure Laterality Date  . Carotid stent      on blood thinner for stent  . Replacement total knee      left knee  . Appendectomy     History reviewed. No pertinent family history. History  Substance Use Topics  . Smoking status: Former Smoker -- 3.00 packs/day    Types: Cigarettes  . Smokeless tobacco: Never Used  . Alcohol Use: No    Review of Systems  Respiratory: Negative for shortness of breath.   Cardiovascular: Negative for chest pain.  Gastrointestinal: Positive for abdominal pain.  Genitourinary: Positive for hematuria.  All other systems reviewed and are negative.     Allergies  Codeine; Demerol;  Diltiazem; Penicillins; and Sulfa drugs cross reactors  Home Medications   Prior to Admission medications   Medication Sig Start Date End Date Taking? Authorizing Provider  albuterol (PROVENTIL) (2.5 MG/3ML) 0.083% nebulizer solution Take 2.5 mg by nebulization every 6 (six) hours as needed for wheezing or shortness of breath.    Historical Provider, MD  aspirin EC 81 MG tablet Take 81 mg by mouth daily.    Historical Provider, MD  cephALEXin (KEFLEX) 500 MG capsule Take 1 capsule (500 mg total) by mouth 4 (four) times daily. 12/28/13   Mora BellmanHannah S Merrell, PA-C  clopidogrel (PLAVIX) 75 MG tablet Take 75 mg by mouth daily.    Historical Provider, MD  furosemide (LASIX) 40 MG tablet Take 1 tablet (40 mg total) by mouth 2 (two) times daily. 08/07/13   Flint MelterElliott L Wentz, MD  glimepiride (AMARYL) 2 MG tablet Take 2 mg by mouth 2 (two) times daily.    Historical Provider, MD  insulin glargine (LANTUS) 100 UNIT/ML injection Inject 0.1 mLs (10 Units total) into the skin at bedtime. 06/23/13   Jeralyn BennettEzequiel Zamora, MD  isosorbide mononitrate (IMDUR) 60 MG 24 hr tablet Take 60 mg by mouth daily.    Historical Provider, MD  levothyroxine (SYNTHROID, LEVOTHROID) 50 MCG tablet Take 50 mcg by mouth daily before breakfast.    Historical Provider, MD  lisinopril (ZESTRIL) 2.5 MG tablet Take 1 tablet (2.5 mg total) by mouth  daily. 06/23/13   Jeralyn Bennett, MD  LORazepam (ATIVAN) 0.5 MG tablet Take 1 tablet (0.5 mg total) by mouth at bedtime. 06/23/13   Jeralyn Bennett, MD  lovastatin (MEVACOR) 20 MG tablet Take 20 mg by mouth at bedtime.    Historical Provider, MD  Lubricants (SURGICAL LUBRICANT) gel Apply 1 application topically as needed (irritation).    Historical Provider, MD  oxybutynin (DITROPAN) 5 MG tablet Take 0.5 tablets (2.5 mg total) by mouth once. 12/28/13   Mora Bellman, PA-C  potassium chloride SA (K-DUR,KLOR-CON) 20 MEQ tablet Take 20 mEq by mouth daily.    Historical Provider, MD   BP 107/63  Pulse  96  Temp(Src) 98.1 F (36.7 C) (Oral)  Resp 18  SpO2 98% Physical Exam  Nursing note and vitals reviewed. Constitutional: He is oriented to person, place, and time. He appears well-developed and well-nourished. No distress.  HENT:  Head: Normocephalic and atraumatic.  Eyes: Conjunctivae are normal. No scleral icterus.  Neck: Neck supple.  Cardiovascular: Normal rate and intact distal pulses.   Pulmonary/Chest: Effort normal. No stridor. No respiratory distress.  Abdominal: Normal appearance. He exhibits no distension. There is tenderness in the suprapubic area. There is no rebound and no guarding.  Genitourinary:  Moderate amount of dark blood has leaked around urinary catheter and is on patient's groin, some appears fresh and some appears dried.  No GU tenderness.  Neurological: He is alert and oriented to person, place, and time.  Skin: Skin is warm and dry. No rash noted.  Psychiatric: He has a normal mood and affect. His behavior is normal.    ED Course  CRITICAL CARE Performed by: Blake Divine DAVID III Authorized by: Blake Divine DAVID III Total critical care time: 45 minutes Critical care time was exclusive of separately billable procedures and treating other patients. Critical care was necessary to treat or prevent imminent or life-threatening deterioration of the following conditions: sepsis. Critical care was time spent personally by me on the following activities: development of treatment plan with patient or surrogate, discussions with consultants, evaluation of patient's response to treatment, examination of patient, obtaining history from patient or surrogate, ordering and performing treatments and interventions, ordering and review of laboratory studies, ordering and review of radiographic studies, pulse oximetry, re-evaluation of patient's condition and review of old charts.   (including critical care time) Labs Review Labs Reviewed  URINALYSIS, ROUTINE W REFLEX  MICROSCOPIC - Abnormal; Notable for the following:    Color, Urine RED (*)    APPearance CLOUDY (*)    Hgb urine dipstick LARGE (*)    Bilirubin Urine LARGE (*)    Ketones, ur 40 (*)    Protein, ur >300 (*)    Nitrite POSITIVE (*)    Leukocytes, UA LARGE (*)    All other components within normal limits  CBC WITH DIFFERENTIAL - Abnormal; Notable for the following:    WBC 23.9 (*)    RBC 3.66 (*)    Hemoglobin 10.6 (*)    HCT 31.3 (*)    Neutrophils Relative % 87 (*)    Neutro Abs 20.7 (*)    Lymphocytes Relative 5 (*)    Monocytes Absolute 1.9 (*)    All other components within normal limits  COMPREHENSIVE METABOLIC PANEL - Abnormal; Notable for the following:    Sodium 133 (*)    Glucose, Bld 304 (*)    BUN 45 (*)    Creatinine, Ser 1.93 (*)    Albumin 3.4 (*)  GFR calc non Af Amer 28 (*)    GFR calc Af Amer 33 (*)    All other components within normal limits  URINE MICROSCOPIC-ADD ON    Imaging Review Dg Pelvis Portable  12/28/2013   CLINICAL DATA:  Hematuria  EXAM: PORTABLE PELVIS 1-2 VIEWS  COMPARISON:  None.  FINDINGS: Pelvic structures are within normal limits. By history there is an indwelling Foley catheter although this is not well visualized on this exam. If necessary contrast injection could be performed to assess placement. Degenerative changes of the lumbar spine are seen.  IMPRESSION: Nonspecific pelvis. If clinically indicated injection in the Foley catheter could be performed to assess for placement. It is not well visualized on this exam.   Electronically Signed   By: Alcide CleverMark  Lukens M.D.   On: 12/28/2013 08:38     EKG Interpretation   Date/Time:  Friday December 29 2013 05:02:43 EDT Ventricular Rate:  88 PR Interval:    QRS Duration: 97 QT Interval:  378 QTC Calculation: 457 R Axis:   -11 Text Interpretation:  Atrial fibrillation Ventricular tachycardia,  unsustained Nonspecific T wave abnormality Inferior infarct, old Lateral  leads are also involved  Baseline wander in lead(s) V1 compared to prior,  now with multiform consecutive PVCs Confirmed by [facility]  MD, TREY ([id number])  on 12/29/2013 5:38:29 AM      MDM   Final diagnoses:  None    Persistent hematuria prior to and since foley change earlier today.  I suspect some degree of catheter blockage as he has blood leaking around foley.  Will bladder scan and try to irrigate.    Bladder scan empty.  Tried to irrigate unsuccessfully.  Discussed with Dr. Brunilda PayorNesi who rec'd 24Fr foley and additional irrigation.  This was performed.  Hematuria did not clear, but clot seemed to clear and foley functioning well.  Meanwhile, during his ED course, he developed low blood pressures.  Continued to mentate well.  Blood pressures seem to respond to fluids, if transiently.  Required 3L IV fluid resuscitation during ED course.  UA shows severe hematuria, making analysis difficult.  But shows nitrites, so is likely source of patient's infection.  He also has leukocytosis of [id number].  He was given ceftriaxone (has tolerated cephalosporins in the past.) Kidney function has also deteriorated somewhat. He will be admitted to the step down unit internal medicine. I spoke again to Dr. Brunilda PayorNesi who will consult on patient.    Candyce ChurnJohn David Wofford III, MD 12/29/13 [phone]

## 2013-12-28 NOTE — ED Notes (Signed)
Pt was kicking covers and sheets off the bed. I placed a new sheet on the patient to cover him up.

## 2013-12-28 NOTE — ED Notes (Signed)
Warm compress placed on pelvic area. Pt reports relief.

## 2013-12-28 NOTE — ED Notes (Signed)
Telephone communication with Pharmacy

## 2013-12-28 NOTE — ED Notes (Signed)
[phone] [facility]  Well[facility] Nurse [facility]. Communication with RN about pt.

## 2013-12-28 NOTE — ED Notes (Signed)
Communication with Pharmacy to send missing dose

## 2013-12-28 NOTE — ED Provider Notes (Signed)
FOLEY CATHETER PLACEMENT I PLACED 20 FRENCH COUDE CATHETER PT WAS PREPPED IN A STERILE FASHION CATHETER WAS PASSED WITHOUT ANY ISSUES PT TOLERATED WELL   Joya Gaskinsonald W Wickline, MD 12/28/13 [phone]

## 2013-12-28 NOTE — ED Notes (Signed)
Pt continuing to have intermittent bladder spasms. Bladder scan at this time is 15 ML

## 2013-12-28 NOTE — ED Notes (Signed)
Bed: WA22 Expected date:  Expected time:  Means of arrival:  Comments: Hold for EMS 

## 2013-12-28 NOTE — ED Notes (Signed)
MD at bedside. And PA

## 2013-12-28 NOTE — ED Notes (Signed)
Pt was requesting some crackers and milk. It was given to pt and now he states that he doesn't want it and cant eat nothing bc he feels like he has to vomit. Patient was previously in the room naked sitting at the edge of his bed. Nurse and tech enyered the room and repositioned the patient. Tech cleaned him up and placed a sheet over him again to cover him up.

## 2013-12-28 NOTE — ED Provider Notes (Signed)
Pt with continued pain Foley appears in place Advised to irrigate Pt currently stable  Joya Gaskinsonald W Wickline, MD 12/28/13 [phone]

## 2013-12-28 NOTE — ED Notes (Signed)
Per EMS , [facility] of [facility]uria ,which started at 4am today , pt. Has foley catheter , pt. Was seen here this afternoon for the same problem, discharged with foley. , alert and oriented x3, denies pain, no SOB.

## 2013-12-28 NOTE — Discharge Instructions (Signed)
Today you were here for urinary retention. Your catheter was replaced, but you still have quite a bit of blood in your catheter bag. You need to see your urologist to follow up. You also have an infection in your urine. Please take the antibiotics as prescribed. Please return to the Emergency Department for new or worsening symptoms.    Urinary Tract Infection A urinary tract infection (UTI) can occur any place along the urinary tract. The tract includes the kidneys, ureters, bladder, and urethra. A type of germ called bacteria often causes a UTI. UTIs are often helped with antibiotic medicine.  HOME CARE   If given, take antibiotics as told by your doctor. Finish them even if you start to feel better.  Drink enough fluids to keep your pee (urine) clear or pale yellow.  Avoid tea, drinks with caffeine, and bubbly (carbonated) drinks.  Pee often. Avoid holding your pee in for a long time.  Pee before and after having sex (intercourse).  Wipe from front to back after you poop (bowel movement) if you are a woman. Use each tissue only once. GET HELP RIGHT AWAY IF:   You have back pain.  You have lower belly (abdominal) pain.  You have chills.  You feel sick to your stomach (nauseous).  You throw up (vomit).  Your burning or discomfort with peeing does not go away.  You have a fever.  Your symptoms are not better in 3 days. MAKE SURE YOU:   Understand these instructions.  Will watch your condition.  Will get help right away if you are not doing well or get worse. Document Released: 12/09/2007 Document Revised: 03/16/2012 Document Reviewed: 01/21/2012 [facility] Patient Information 2015 [employer] [place]. This information is not intended to replace advice given to you by your health care provider. Make sure you discuss any questions you have with your health care provider.  Acute Urinary Retention Acute urinary retention is when you are unable to pee (urinate). Acute urinary  retention is common in older men. Prostates can get bigger, which blocks the flow of pee.  HOME CARE  Drink enough fluids to keep your pee clear or pale yellow.  If you are sent home with a tube that drains the bladder (catheter), there will be a drainage bag attached to it. There are two types of bags. One is big that you can wear at night without having to empty it. One is smaller and needs to be emptied more often.  Keep the drainage bag empty.  Keep the drainage bag lower than your catheter.  Only take medicine as told by your doctor. GET HELP IF:  You have a low-grade fever.  You have spasms or you are leaking pee when you have spasms. GET HELP RIGHT AWAY IF:   You have chills or a fever.  Your catheter stops draining pee.  Your catheter falls out.  You have increased bleeding that does not stop after you have rested and increased the amount of fluids you had been drinking. MAKE SURE YOU:   Understand these instructions.  Will watch your condition.  Will get help right away if you are not doing well or get worse. Document Released: 12/09/2007 Document Revised: 04/12/2013 Document Reviewed: 12/01/2012 [facility] Patient Information 2015 [employer], [place]. This information is not intended to replace advice given to you by your health care provider. Make sure you discuss any questions you have with your health care provider.

## 2013-12-28 NOTE — ED Notes (Signed)
[facility]

## 2013-12-28 NOTE — ED Provider Notes (Signed)
CSN: [id number]     Arrival date & time 12/28/13  4098 History   First MD Initiated Contact with Patient 12/28/13 [phone]     Chief Complaint  Patient presents with  . Urinary Retention     (Consider location/radiation/quality/duration/timing/severity/associated sxs/prior Treatment) HPI Comments: Patient is a 80 year old male with history of hypertension, diabetes, thyroid disease, high cholesterol, atrial fibrillation, CHF, emphysema who presents today with urinary retention. [facility] retention which began at 3 AM. He has a Foley catheter in place with blood in the tube. Currently he feels as though there is still urine in his bladder. The Foley catheter has been replaced. He sees Dr. Margarita Grizzle at [facility]. Hematuria is a problem that he has been intermittent struggling with for years. He had some associated constipation yesterday. The patient denies any other symptoms including nausea, vomiting, abdominal pain. He is on Plavix and ASA.   The history is provided by the patient. No language interpreter was used.    Past Medical History  Diagnosis Date  . Hypertension   . Diabetes mellitus   . Thyroid disease   . High cholesterol   . Atrial fibrillation   . CHF (congestive heart failure)   . Emphysema     35% of lungs   Past Surgical History  Procedure Laterality Date  . Carotid stent      on blood thinner for stent  . Replacement total knee      left knee  . Appendectomy     No family history on file. History  Substance Use Topics  . Smoking status: Former Smoker -- 3.00 packs/day    Types: Cigarettes  . Smokeless tobacco: Never Used  . Alcohol Use: No    Review of Systems  Constitutional: Negative for fever and chills.  Respiratory: Negative for shortness of breath.   Cardiovascular: Negative for chest pain.  Gastrointestinal: Positive for constipation. Negative for nausea, vomiting and abdominal pain.   Genitourinary: Positive for hematuria and difficulty urinating.  All other systems reviewed and are negative.     Allergies  Codeine; Demerol; Diltiazem; Penicillins; and Sulfa drugs cross reactors  Home Medications   Prior to Admission medications   Medication Sig Start Date End Date Taking? Authorizing Catalena Stanhope  albuterol (PROVENTIL) (2.5 MG/3ML) 0.083% nebulizer solution Take 2.5 mg by nebulization every 6 (six) hours as needed for wheezing or shortness of breath.    Historical Vail Basista, MD  aspirin EC 81 MG tablet Take 81 mg by mouth daily.    Historical Donnovan Stamour, MD  Camphor (JOINTFLEX) 3.1 % CREA Apply 1 application topically 2 (two) times daily as needed (joint pain).    Historical Marigold Mom, MD  cephALEXin (KEFLEX) 500 MG capsule Take 1 capsule (500 mg total) by mouth 4 (four) times daily. 09/03/13   Gilda Crease, MD  clopidogrel (PLAVIX) 75 MG tablet Take 75 mg by mouth daily.    Historical Darik Massing, MD  furosemide (LASIX) 40 MG tablet Take 1 tablet (40 mg total) by mouth 2 (two) times daily. 08/07/13   Flint Melter, MD  glimepiride (AMARYL) 2 MG tablet Take 2 mg by mouth 2 (two) times daily.    Historical Cherlyn Syring, MD  insulin glargine (LANTUS) 100 UNIT/ML injection Inject 10 Units into the skin daily. 10/28/11 10/27/12  Henderson Cloud, MD  insulin glargine (LANTUS) 100 UNIT/ML injection Inject 0.1 mLs (10 Units total) into the skin at bedtime. 06/23/13  Jeralyn BennettEzequiel Zamora, MD  ipratropium (ATROVENT) 0.02 % nebulizer solution Take 0.5 mg by nebulization 4 (four) times daily.    Historical Kashira Behunin, MD  isosorbide mononitrate (IMDUR) 60 MG 24 hr tablet Take 60 mg by mouth daily.    Historical Shayleigh Bouldin, MD  levothyroxine (SYNTHROID, LEVOTHROID) 50 MCG tablet Take 50 mcg by mouth daily before breakfast.    Historical Leman Martinek, MD  lisinopril (ZESTRIL) 2.5 MG tablet Take 1 tablet (2.5 mg total) by mouth daily. 06/23/13   Jeralyn BennettEzequiel Zamora, MD  LORazepam (ATIVAN) 0.5 MG  tablet Take 1 tablet (0.5 mg total) by mouth at bedtime. 06/23/13   Jeralyn BennettEzequiel Zamora, MD  lovastatin (MEVACOR) 20 MG tablet Take 20 mg by mouth at bedtime.    Historical Aayra Hornbaker, MD  OVER THE COUNTER MEDICATION Apply 1 application topically. 3 to 4 times daily as needed for arthritis pain (Blue Star Oint)    Historical Hana Trippett, MD  Polyethyl Glycol-Propyl Glycol (SYSTANE) 0.4-0.3 % SOLN Place 1-2 drops into both eyes as needed (dry eyes).    Historical Aleksa Catterton, MD  polyethylene glycol (MIRALAX / GLYCOLAX) packet Take 17 g by mouth daily as needed for mild constipation.    Historical Azaryah Oleksy, MD  potassium chloride SA (K-DUR,KLOR-CON) 20 MEQ tablet Take 20 mEq by mouth daily.    Historical Gershon Shorten, MD  senna (SENOKOT) 8.6 MG TABS tablet Take 1 tablet by mouth at bedtime as needed for mild constipation.    Historical Charlesia Canaday, MD   BP 131/72  Pulse 75  Temp(Src) 97.7 F (36.5 C) (Oral)  Resp 16  SpO2 98% Physical Exam  Nursing note and vitals reviewed. Constitutional: He is oriented to person, place, and time. He appears well-developed and well-nourished. No distress.  HENT:  Head: Normocephalic and atraumatic.  Right Ear: External ear normal.  Left Ear: External ear normal.  Nose: Nose normal.  Eyes: Conjunctivae are normal.  Neck: Normal range of motion. No tracheal deviation present.  Cardiovascular: Normal rate, regular rhythm and normal heart sounds.   Pulmonary/Chest: Effort normal and breath sounds normal. No stridor.  Abdominal: Soft. He exhibits no distension. There is no tenderness.  Genitourinary:  Dark red blood in the catheter bag  Musculoskeletal: Normal range of motion.  Neurological: He is alert and oriented to person, place, and time.  Skin: Skin is warm and dry. He is not diaphoretic.  Psychiatric: He has a normal mood and affect. His behavior is normal.    ED Course  Procedures (including critical care time) Labs Review Labs Reviewed  URINALYSIS, ROUTINE W  REFLEX MICROSCOPIC - Abnormal; Notable for the following:    Color, Urine RED (*)    APPearance TURBID (*)    Glucose, UA 100 (*)    Hgb urine dipstick LARGE (*)    Bilirubin Urine SMALL (*)    Protein, ur >300 (*)    Nitrite POSITIVE (*)    All other components within normal limits  URINE MICROSCOPIC-ADD ON - Abnormal; Notable for the following:    Bacteria, UA MANY (*)    All other components within normal limits  CBC WITH DIFFERENTIAL - Abnormal; Notable for the following:    WBC 14.8 (*)    RBC 4.07 (*)    Hemoglobin 11.7 (*)    HCT 35.2 (*)    Neutro Abs 11.4 (*)    All other components within normal limits  COMPREHENSIVE METABOLIC PANEL - Abnormal; Notable for the following:    Glucose, Bld 216 (*)    BUN 28 (*)  GFR calc non Af Amer 68 (*)    GFR calc Af Amer 79 (*)    All other components within normal limits  URINE CULTURE    Imaging Review Dg Pelvis Portable  12/28/2013   CLINICAL DATA:  Hematuria  EXAM: PORTABLE PELVIS 1-2 VIEWS  COMPARISON:  None.  FINDINGS: Pelvic structures are within normal limits. By history there is an indwelling Foley catheter although this is not well visualized on this exam. If necessary contrast injection could be performed to assess placement. Degenerative changes of the lumbar spine are seen.  IMPRESSION: Nonspecific pelvis. If clinically indicated injection in the Foley catheter could be performed to assess for placement. It is not well visualized on this exam.   Electronically Signed   By: Alcide CleverMark  Lukens M.D.   On: 12/28/2013 08:38     EKG Interpretation None      MDM   Final diagnoses:  Urinary retention  Hematuria  UTI (lower urinary tract infection)    11:24 AM Discussed case with Dr. Margarita GrizzleWoodruff who recommends belladonna and opium suppository, 20 Fr coude, and hand irrigation.   Patient presents to ED for urinary retention. During his ED course initially a 16 Coude catheter was placed. There was good urine flow, but shortly  after this was placed the patient began having pain. The catheter was flushed and it appeared to be placed correctly in the bladder. Urology was consulted and recommend 20 Coude. This was placed by Dr. Bebe ShaggyWickline without complication. Patient began to feel more comfortable and hematuria was beginning to clear. Bag filled with "cherry lemonade" color urine prior to discharge home. UA is nitrite positive so Keflex will be prescribed and urine culture will be sent. No renal insufficiency, mild leukocytosis. Return instructions given to both patient and in nurse's report to SNF. Vital signs stable for discharge. Dr. Bebe ShaggyWickline has evaluated the patient and agrees with plan. Patient / Family / Caregiver informed of clinical course, understand medical decision-making process, and agree with plan.   Mora BellmanHannah S Merrell, PA-C 12/29/13 [phone]

## 2013-12-28 NOTE — ED Notes (Signed)
[facility]. Foley cath in place, blood in tube and at site. Woke up at 3 AM with urge to urinate, unable to go. Nursing home states that there was a significant amount of blood in urine however EMS did not visualize urine.

## 2013-12-28 NOTE — ED Notes (Signed)
MD at bedside. 

## 2013-12-29 ENCOUNTER — Inpatient Hospital Stay (HOSPITAL_COMMUNITY): Payer: Medicare Other

## 2013-12-29 ENCOUNTER — Encounter (HOSPITAL_COMMUNITY): Payer: Self-pay

## 2013-12-29 DIAGNOSIS — N179 Acute kidney failure, unspecified: Secondary | ICD-10-CM

## 2013-12-29 DIAGNOSIS — R6510 Systemic inflammatory response syndrome (SIRS) of non-infectious origin without acute organ dysfunction: Secondary | ICD-10-CM | POA: Diagnosis present

## 2013-12-29 DIAGNOSIS — A419 Sepsis, unspecified organism: Principal | ICD-10-CM

## 2013-12-29 DIAGNOSIS — R319 Hematuria, unspecified: Secondary | ICD-10-CM

## 2013-12-29 DIAGNOSIS — I959 Hypotension, unspecified: Secondary | ICD-10-CM

## 2013-12-29 LAB — COMPREHENSIVE METABOLIC PANEL
ALT: 7 U/L (ref 0–53)
AST: 13 U/L (ref 0–37)
Albumin: 3.4 g/dL — ABNORMAL LOW (ref 3.5–5.2)
Alkaline Phosphatase: 61 U/L (ref 39–117)
BUN: 45 mg/dL — AB (ref 6–23)
CALCIUM: 8.8 mg/dL (ref 8.4–10.5)
CO2: 21 meq/L (ref 19–32)
Chloride: 96 mEq/L (ref 96–112)
Creatinine, Ser: 1.93 mg/dL — ABNORMAL HIGH (ref 0.50–1.35)
GFR, EST AFRICAN AMERICAN: 33 mL/min — AB (ref >90)
GFR, EST NON AFRICAN AMERICAN: 28 mL/min — AB (ref >90)
GLUCOSE: 304 mg/dL — AB (ref 70–99)
Potassium: 4.6 mEq/L (ref 3.7–5.3)
Sodium: 133 mEq/L — ABNORMAL LOW (ref 137–147)
TOTAL PROTEIN: 6.4 g/dL (ref 6.0–8.3)
Total Bilirubin: 0.7 mg/dL (ref 0.3–1.2)

## 2013-12-29 LAB — URINALYSIS, ROUTINE W REFLEX MICROSCOPIC
GLUCOSE, UA: NEGATIVE mg/dL
Ketones, ur: 40 mg/dL — AB
Nitrite: POSITIVE — AB
Protein, ur: >300 mg/dL — AB
SPECIFIC GRAVITY, URINE: 1.023 (ref 1.005–1.030)
Urobilinogen, UA: 1 mg/dL (ref 0.0–1.0)
pH: 5 (ref 5.0–8.0)

## 2013-12-29 LAB — CBC WITH DIFFERENTIAL/PLATELET
Basophils Absolute: 0 10*3/uL (ref 0.0–0.1)
Basophils Relative: 0 % (ref 0–1)
EOS ABS: 0 10*3/uL (ref 0.0–0.7)
EOS PCT: 0 % (ref 0–5)
HEMATOCRIT: 31.3 % — AB (ref 39.0–52.0)
HEMOGLOBIN: 10.6 g/dL — AB (ref 13.0–17.0)
LYMPHS ABS: 1.2 10*3/uL (ref 0.7–4.0)
LYMPHS PCT: 5 % — AB (ref 12–46)
MCH: 29 pg (ref 26.0–34.0)
MCHC: 33.9 g/dL (ref 30.0–36.0)
MCV: 85.5 fL (ref 78.0–100.0)
MONO ABS: 1.9 10*3/uL — AB (ref 0.1–1.0)
MONOS PCT: 8 % (ref 3–12)
Neutro Abs: 20.7 10*3/uL — ABNORMAL HIGH (ref 1.7–7.7)
Neutrophils Relative %: 87 % — ABNORMAL HIGH (ref 43–77)
Platelets: 190 10*3/uL (ref 150–400)
RBC: 3.66 MIL/uL — AB (ref 4.22–5.81)
RDW: 13.7 % (ref 11.5–15.5)
WBC: 23.9 10*3/uL — AB (ref 4.0–10.5)

## 2013-12-29 LAB — GLUCOSE, CAPILLARY
GLUCOSE-CAPILLARY: 139 mg/dL — AB (ref 70–99)
GLUCOSE-CAPILLARY: 149 mg/dL — AB (ref 70–99)
Glucose-Capillary: 171 mg/dL — ABNORMAL HIGH (ref 70–99)
Glucose-Capillary: 105 mg/dL — ABNORMAL HIGH (ref 70–99)

## 2013-12-29 LAB — HEMOGLOBIN AND HEMATOCRIT, BLOOD
HCT: 24.7 % — ABNORMAL LOW (ref 39.0–52.0)
HCT: 25.2 % — ABNORMAL LOW (ref 39.0–52.0)
HEMOGLOBIN: 8.4 g/dL — AB (ref 13.0–17.0)
Hemoglobin: 8.4 g/dL — ABNORMAL LOW (ref 13.0–17.0)

## 2013-12-29 LAB — URINE CULTURE: Colony Count: 60000

## 2013-12-29 LAB — HEMOGLOBIN A1C
Hgb A1c MFr Bld: 7.5 % — ABNORMAL HIGH (ref <5.7)
Mean Plasma Glucose: 169 mg/dL — ABNORMAL HIGH (ref <117)

## 2013-12-29 LAB — URINE MICROSCOPIC-ADD ON

## 2013-12-29 LAB — MRSA PCR SCREENING: MRSA BY PCR: NEGATIVE

## 2013-12-29 MED ORDER — LORAZEPAM 0.5 MG PO TABS
0.5000 mg | ORAL_TABLET | Freq: Every day | ORAL | Status: DC
  Administered 2013-12-29 – 2014-01-01 (×4): 0.5 mg via ORAL
  Filled 2013-12-29 (×5): qty 1

## 2013-12-29 MED ORDER — INSULIN GLARGINE 100 UNIT/ML ~~LOC~~ SOLN
10.0000 [IU] | Freq: Every day | SUBCUTANEOUS | Status: DC
  Administered 2013-12-29 – 2014-01-01 (×4): 10 [IU] via SUBCUTANEOUS
  Filled 2013-12-29 (×4): qty 0.1

## 2013-12-29 MED ORDER — BIOTENE DRY MOUTH MT LIQD
15.0000 mL | Freq: Two times a day (BID) | OROMUCOSAL | Status: DC
  Administered 2013-12-29 – 2014-01-05 (×12): 15 mL via OROMUCOSAL

## 2013-12-29 MED ORDER — SODIUM CHLORIDE 0.9 % IR SOLN
3000.0000 mL | Status: DC
  Administered 2013-12-30 (×2): 3000 mL

## 2013-12-29 MED ORDER — LEVOTHYROXINE SODIUM 50 MCG PO TABS
50.0000 ug | ORAL_TABLET | Freq: Every day | ORAL | Status: DC
  Administered 2013-12-30 – 2014-01-05 (×7): 50 ug via ORAL
  Filled 2013-12-29 (×8): qty 1

## 2013-12-29 MED ORDER — SODIUM CHLORIDE 0.9 % IV BOLUS (SEPSIS)
500.0000 mL | Freq: Once | INTRAVENOUS | Status: AC
  Administered 2013-12-29: 500 mL via INTRAVENOUS

## 2013-12-29 MED ORDER — SODIUM CHLORIDE 0.9 % IV SOLN
Freq: Once | INTRAVENOUS | Status: AC
  Administered 2013-12-29: 06:00:00 via INTRAVENOUS

## 2013-12-29 MED ORDER — ONDANSETRON HCL 4 MG/2ML IJ SOLN
4.0000 mg | Freq: Once | INTRAMUSCULAR | Status: AC
  Administered 2013-12-29: 4 mg via INTRAVENOUS
  Filled 2013-12-29: qty 2

## 2013-12-29 MED ORDER — SODIUM CHLORIDE 0.9 % IV SOLN
Freq: Once | INTRAVENOUS | Status: AC
  Administered 2013-12-29: 04:00:00 via INTRAVENOUS

## 2013-12-29 MED ORDER — LIDOCAINE HCL 2 % EX GEL
CUTANEOUS | Status: AC
Start: 2013-12-29 — End: 2013-12-29
  Administered 2013-12-29: 1
  Filled 2013-12-29: qty 10

## 2013-12-29 MED ORDER — ALBUTEROL SULFATE (2.5 MG/3ML) 0.083% IN NEBU
2.5000 mg | INHALATION_SOLUTION | Freq: Four times a day (QID) | RESPIRATORY_TRACT | Status: DC | PRN
  Administered 2013-12-29 – 2014-01-02 (×4): 2.5 mg via RESPIRATORY_TRACT
  Filled 2013-12-29 (×4): qty 3

## 2013-12-29 MED ORDER — MORPHINE SULFATE 2 MG/ML IJ SOLN
2.0000 mg | Freq: Once | INTRAMUSCULAR | Status: AC
  Administered 2013-12-29: 2 mg via INTRAVENOUS
  Filled 2013-12-29: qty 1

## 2013-12-29 MED ORDER — ONDANSETRON HCL 4 MG/2ML IJ SOLN: 4.0000 mg | Freq: Four times a day (QID) | INTRAMUSCULAR | Status: DC | PRN

## 2013-12-29 MED ORDER — ONDANSETRON HCL 4 MG PO TABS: 4.0000 mg | ORAL_TABLET | Freq: Four times a day (QID) | ORAL | Status: DC | PRN

## 2013-12-29 MED ORDER — INSULIN ASPART 100 UNIT/ML ~~LOC~~ SOLN
0.0000 [IU] | Freq: Three times a day (TID) | SUBCUTANEOUS | Status: DC
  Administered 2013-12-29: 2 [IU] via SUBCUTANEOUS
  Administered 2013-12-29 – 2013-12-30 (×3): 1 [IU] via SUBCUTANEOUS
  Administered 2013-12-30: 2 [IU] via SUBCUTANEOUS
  Administered 2013-12-31: 1 [IU] via SUBCUTANEOUS
  Administered 2013-12-31: 2 [IU] via SUBCUTANEOUS
  Administered 2013-12-31 – 2014-01-03 (×3): 1 [IU] via SUBCUTANEOUS
  Administered 2014-01-03 (×2): 2 [IU] via SUBCUTANEOUS
  Administered 2014-01-04: 1 [IU] via SUBCUTANEOUS
  Administered 2014-01-04 (×2): 2 [IU] via SUBCUTANEOUS
  Administered 2014-01-05: 3 [IU] via SUBCUTANEOUS
  Administered 2014-01-05: 2 [IU] via SUBCUTANEOUS

## 2013-12-29 MED ORDER — DEXTROSE 5 % IV SOLN
1.0000 g | INTRAVENOUS | Status: DC
  Administered 2013-12-29 – 2014-01-01 (×4): 1 g via INTRAVENOUS
  Filled 2013-12-29 (×6): qty 10

## 2013-12-29 MED ORDER — SODIUM CHLORIDE 0.9 % IV SOLN
INTRAVENOUS | Status: DC
  Administered 2013-12-29 (×2): via INTRAVENOUS
  Administered 2013-12-30: 1000 mL via INTRAVENOUS
  Administered 2013-12-31: 06:00:00 via INTRAVENOUS

## 2013-12-29 MED ORDER — DEXTROSE 5 % IV SOLN
1.0000 g | Freq: Once | INTRAVENOUS | Status: AC
  Administered 2013-12-29: 1 g via INTRAVENOUS
  Filled 2013-12-29: qty 10

## 2013-12-29 MED ORDER — OXYBUTYNIN CHLORIDE 5 MG PO TABS
2.5000 mg | ORAL_TABLET | Freq: Once | ORAL | Status: AC
  Administered 2013-12-29: 2.5 mg via ORAL
  Filled 2013-12-29: qty 1

## 2013-12-29 MED ORDER — POLYETHYLENE GLYCOL 3350 17 G PO PACK
17.0000 g | PACK | Freq: Two times a day (BID) | ORAL | Status: DC | PRN
  Filled 2013-12-29: qty 1

## 2013-12-29 MED ORDER — SODIUM CHLORIDE 0.9 % IV SOLN
Freq: Once | INTRAVENOUS | Status: AC
  Administered 2013-12-30: 500 mL via INTRAVENOUS

## 2013-12-29 MED ORDER — SODIUM CHLORIDE 0.9 % IV BOLUS (SEPSIS)
1000.0000 mL | Freq: Once | INTRAVENOUS | Status: AC
  Administered 2013-12-29: 1000 mL via INTRAVENOUS

## 2013-12-29 NOTE — ED Notes (Signed)
Urine in bag and cath are still predominately blood, will obtain specimen once urine color is more clear

## 2013-12-29 NOTE — ED Notes (Signed)
Foley cath irrigated with 2000 ml of 0.9 NaCl irrigation soln. , with bloody return with clots in the same amount noted, recorded.

## 2013-12-29 NOTE — ED Notes (Signed)
Fr.24 inserted without difficulty , able to obtain return of bloody urine with clots, foley irragated per MD's order, noted with large amount of bloody output with clots.

## 2013-12-29 NOTE — Progress Notes (Signed)
CARE MANAGEMENT NOTE 12/29/2013  Patient:  Carlos Morton,Carlos Morton   Account Number:  [id number]  Date Initiated:  12/29/2013  Documentation initiated by:  DAVIS,RHONDA  Subjective/Objective Assessment:   pt with history of urinary problems and chronic foley use presents with increased urinary pain, elevated wbc, poss sepsis. hypotensive     Action/Plan:   [facility]   Anticipated DC Date:  01/01/2014   Anticipated DC Plan:  ASSISTED LIVING / REST HOME  In-house referral  Clinical Social Worker      DC Planning Services  NA      [facility]   Choice offered to / List presented to:  NA   DME arranged  NA      DME agency  NA     HH arranged  NA      HH agency  NA   Status of service:  In process, will continue to follow Medicare Important Message given?  NA - LOS <3 / Initial given by admissions (If response is "NO", the following Medicare IM given date fields will be blank) Date Medicare IM given:   Date Additional Medicare IM given:    Discharge Disposition:    Per UR Regulation:  Reviewed for med. necessity/level of care/duration of stay  If discussed at Long Length of Stay Meetings, dates discussed:    Comments:  [id number]/Rhonda Stark JockDavis, RN, BSN, [place] [phone] Chart Reviewed for discharge and hospital needs. Discharge needs at time of review: None present will follow for needs. IM UPDATE NEEDED ON [id number] IF REMAINS INPATIENT Review of patient progress due on [id number].

## 2013-12-29 NOTE — H&P (Signed)
[facility] History and Physical  Carlos LevyClayton Callens ONG:[id number] DOB: 02-23-1917 DOA: 12/28/2013  Referring physician: edp PCP: Karle PlumberARVIND,MOOGALI M, MD   Chief Complaint: hematuria.  HPI: Carlos Morton is a 80 y.o. male with h/o hypertension, DM, afib, CHF, came in yesterday to ED, for acute urinary retention  And hematuria yesterday, underwent foley cathter placement and was discharged back to the facility with an antibiotic. He presented later this morning for gross hematuria, . On arrival to ED, he was found to be febrile, hypotension, tachycardic. He was referred to medical service for admission . Currently patient denies any new complaints.    Review of Systems:  See hpi otherwise negative.   Past Medical History  Diagnosis Date  . Hypertension   . Diabetes mellitus   . Thyroid disease   . High cholesterol   . Atrial fibrillation   . CHF (congestive heart failure)   . Emphysema     35% of lungs   Past Surgical History  Procedure Laterality Date  . Carotid stent      on blood thinner for stent  . Replacement total knee      left knee  . Appendectomy     Social History:  reports that he has quit smoking. His smoking use included Cigarettes. He smoked 3.00 packs per day. He has never used smokeless tobacco. He reports that he does not drink alcohol or use illicit drugs.  Allergies  Allergen Reactions  . Codeine Other (See Comments)    unknown  . Demerol [Meperidine] Other (See Comments)     Unknown per pt  mar  . Diltiazem Other (See Comments)     Unknown per pt  mar  . Penicillins Other (See Comments)     Unknown per pt  mar  . Sulfa Drugs Cross Reactors Hives    [facility]    History reviewed. No pertinent family history.   Prior to Admission medications   Medication Sig Start Date End Date Taking? Authorizing Provider  albuterol (PROVENTIL) (2.5 MG/3ML) 0.083% nebulizer solution Take 2.5 mg by nebulization every 6 (six) hours as needed for wheezing or  shortness of breath.   Yes Historical Provider, MD  aspirin EC 81 MG tablet Take 81 mg by mouth daily.   Yes Historical Provider, MD  Camphor (JOINTFLEX) 3.1 % CREA Apply 1 application topically 2 (two) times daily as needed (pain).   Yes Historical Provider, MD  cephALEXin (KEFLEX) 500 MG capsule Take 1 capsule (500 mg total) by mouth 4 (four) times daily. 12/28/13  Yes Mora BellmanHannah S Merrell, PA-C  clopidogrel (PLAVIX) 75 MG tablet Take 75 mg by mouth daily.   Yes Historical Provider, MD  furosemide (LASIX) 40 MG tablet Take 1 tablet (40 mg total) by mouth 2 (two) times daily. 08/07/13  Yes Flint MelterElliott L Wentz, MD  glimepiride (AMARYL) 2 MG tablet Take 2 mg by mouth 2 (two) times daily.   Yes Historical Provider, MD  insulin glargine (LANTUS) 100 UNIT/ML injection Inject 0.1 mLs (10 Units total) into the skin at bedtime. 06/23/13  Yes Jeralyn BennettEzequiel Zamora, MD  isosorbide mononitrate (IMDUR) 60 MG 24 hr tablet Take 60 mg by mouth daily.   Yes Historical Provider, MD  levothyroxine (SYNTHROID, LEVOTHROID) 50 MCG tablet Take 50 mcg by mouth daily before breakfast.   Yes Historical Provider, MD  lisinopril (ZESTRIL) 2.5 MG tablet Take 1 tablet (2.5 mg total) by mouth daily. 06/23/13  Yes Jeralyn BennettEzequiel Zamora, MD  LORazepam (ATIVAN) 0.5 MG tablet Take  1 tablet (0.5 mg total) by mouth at bedtime. 06/23/13  Yes Jeralyn BennettEzequiel Zamora, MD  lovastatin (MEVACOR) 20 MG tablet Take 20 mg by mouth at bedtime.   Yes Historical Provider, MD  Lubricants (SURGICAL LUBRICANT) gel Apply 1 application topically 3 (three) times daily. Apply to tip of penis for irritation or discomfort   Yes Historical Provider, MD  OVER THE COUNTER MEDICATION Apply 1 application topically 3 (three) times daily as needed (joint pain). [facility] Star Ointment   Yes Historical Provider, MD  Polyethyl Glycol-Propyl Glycol (SYSTANE OP) Apply 1-2 drops to eye daily as needed (dry eyes).   Yes Historical Provider, MD  polyethylene glycol (MIRALAX / GLYCOLAX) packet Take 17 g  by mouth 2 (two) times daily as needed for mild constipation.   Yes Historical Provider, MD  potassium chloride SA (K-DUR,KLOR-CON) 20 MEQ tablet Take 20 mEq by mouth daily.   Yes Historical Provider, MD  senna (SENOKOT) 8.6 MG TABS tablet Take 1 tablet by mouth at bedtime as needed for mild constipation.   Yes Historical Provider, MD  oxybutynin (DITROPAN) 5 MG tablet Take 0.5 tablets (2.5 mg total) by mouth once. 12/28/13   Mora BellmanHannah S Merrell, PA-C   Physical Exam: Filed Vitals:   12/29/13 1005  BP: 74/40  Pulse:   Temp:   Resp: 20    BP 74/40  Pulse 85  Temp(Src) 98 F (36.7 C) (Oral)  Resp 20  Ht 5\' 8"  (1.727 m)  Wt 72.7 kg (160 lb 4.4 oz)  BMI 24.38 kg/m2  SpO2 92%  General:  Appears calm and comfortable Eyes: PERRL, normal lids, irises & conjunctiva Cardiovascular: tachycardic, no m/r/g. No LE edema.  Respiratory: CTA bilaterally, no w/r/r. Normal respiratory effort. Abdomen: soft, ntnd Skin: no rash or induration seen on limited exam Musculoskeletal: grossly normal tone BUE/BLE Neurologic: grossly non-focal.          Labs on Admission:  Basic Metabolic Panel:  Recent Labs Lab 12/28/13 0919 12/29/13 0109  NA 140 133*  K 4.2 4.6  CL 100 96  CO2 24 21  GLUCOSE 216* 304*  BUN 28* 45*  CREATININE 0.99 1.93*  CALCIUM 8.9 8.8   Liver Function Tests:  Recent Labs Lab 12/28/13 0919 12/29/13 0109  AST 12 13  ALT 8 7  ALKPHOS 64 61  BILITOT 0.6 0.7  PROT 6.7 6.4  ALBUMIN 3.6 3.4*   No results found for this basename: LIPASE, AMYLASE,  in the last 168 hours No results found for this basename: AMMONIA,  in the last 168 hours CBC:  Recent Labs Lab 12/28/13 0919 12/29/13 0109  WBC 14.8* 23.9*  NEUTROABS 11.4* 20.7*  HGB 11.7* 10.6*  HCT 35.2* 31.3*  MCV 86.5 85.5  PLT 193 190   Cardiac Enzymes: No results found for this basename: CKTOTAL, CKMB, CKMBINDEX, TROPONINI,  in the last 168 hours  BNP (last 3 results)  Recent Labs  06/21/13 1750  06/22/13 0330  PROBNP 2179.0* 2124.0*   CBG:  Recent Labs Lab 12/29/13 1002  GLUCAP 139*    Radiological Exams on Admission: Dg Pelvis Portable  12/28/2013   CLINICAL DATA:  Hematuria  EXAM: PORTABLE PELVIS 1-2 VIEWS  COMPARISON:  None.  FINDINGS: Pelvic structures are within normal limits. By history there is an indwelling Foley catheter although this is not well visualized on this exam. If necessary contrast injection could be performed to assess placement. Degenerative changes of the lumbar spine are seen.  IMPRESSION: Nonspecific pelvis. If clinically indicated injection  in the Foley catheter could be performed to assess for placement. It is not well visualized on this exam.   Electronically Signed   By: Alcide Clever M.D.   On: 12/28/2013 08:38    EKG: abnormal.  Assessment/Plan Active Problems:   Sepsis   Sepsis secondary to UTI: - admit to step down. Started patient on rocephin. Urine and blood cultures ordered and pending.  - hypotension secondary to sepsis.  - IV hydration with normal saline,.    Fever, leukocytosis, hypotension: - secondary to sepsis from UTI.   Hematuria: - possibly traumatic. foleyin place, underwent irrigation in ED.  - urology consult from Dr Brunilda Payor requested.    Anemia of Blood loss: -possibly from the hematuria.  - will transfuse if H&h drops to less than 7. - monitor hemoglobin  - holding aspirin and plavix.    Diabetes Mellitus: - ssi, resume lantus. - hgba1c ordered.   Acute renal failure: - probably from sepsis and UTI, dehdyration.  - hydration and repeat renal parameters in am.   DVT prophylaxis.  scd's      Code Status: DNR Family Communication: none at bedside Disposition Plan: PENDING, admit to step down.   Time spent: 60 min  AKULA,VIJAYA [facility] Pager [phone]  **Disclaimer: This note may have been dictated with voice recognition software. Similar sounding words can inadvertently be transcribed  and this note may contain transcription errors which may not have been corrected upon publication of note.**

## 2013-12-29 NOTE — ED Notes (Signed)
URINALYSIS CLICKED OFF IN ERROR. URINE STILL NEEDS TO BE COLLECTED.   PER RN LILIBETH, HOLD OFF ON COLLECTION DUE TO BLADDER SCAN SHOWING NO URINE IN BLADDER

## 2013-12-29 NOTE — ED Notes (Signed)
Report given to Shay, RN.

## 2013-12-29 NOTE — Consult Note (Signed)
Urology Consult  Referring physician: Dr. Hosie Poisson Reason for referral:   Chief Complaint: Gross hematuria  History of Present Illness: Patient is a 80 years old male patient of Dr. Delton Coombes who has a long history of intermittent gross hematuria. He had a cystoscopy about 4 months ago that showed no evidence of bladder tumor. He has had an indwelling Foley catheter for a while. He had gross hematuria yesterday morning and went to the emergency room. He also had severe suprapubic pain. The catheter was replaced with a #20 [place]. However he continued to have gross hematuria and returned to the emergency room last night. The catheter was irrigated with normal saline. He still had gross hematuria and blood clots. The catheter was then replaced with a #24 [place]. He was found to have a white count of 23,000. His creatinine was 1.93 and BUN 45. He was then admitted for further evaluation. And I was asked to see him in consultation. I replaced the 24 French Foley catheter with a 24 [place] three-way hematuria catheter. I then irrigated the catheter with normal saline. I irrigated several blood clots out of the bladder until there were no more blood clots and the returns were clear. The catheter was then left to straight drainage. If the hematuria recurs we will then start continuous bladder irrigation.  Past Medical History  Diagnosis Date  . Hypertension   . Diabetes mellitus   . Thyroid disease   . High cholesterol   . Atrial fibrillation   . CHF (congestive heart failure)   . Emphysema    Past Surgical History  Procedure Laterality Date  . Carotid stent      on blood thinner for stent  . Replacement total knee      left knee  . Appendectomy      Medications: Albuterol, aspirin, clopidogrel, furosemide, glimepiride, isosorbide, levothyroxine, lisinopril, lovastatin, oxybutynin, potassium chloride, senna Allergies:  Allergies  Allergen Reactions  . Codeine Other (See Comments)   unknown  . Demerol [Meperidine] Other (See Comments)     Unknown per pt  mar  . Diltiazem Other (See Comments)     Unknown per pt  mar  . Penicillins Other (See Comments)     Unknown per pt  mar  . Sulfa Drugs Cross Reactors Hives    Per [facility]    History reviewed. No pertinent family history. Social History:  reports that he has quit smoking. His smoking use included Cigarettes. He smoked 3.00 packs per day. He has never used smokeless tobacco. He reports that he does not drink alcohol or use illicit drugs.  ROS: All systems are reviewed and negative except as noted.   Physical Exam:  Vital signs in last 24 hours: Temp:  [97.7 F (36.5 C)-100.6 F (38.1 C)] 97.7 F (36.5 C) (06/26 1600) Pulse Rate:  [34-109] 76 (06/26 1800) Resp:  [10-25] 18 (06/26 1800) BP: (74-153)/(17-75) 94/41 mmHg (06/26 1800) SpO2:  [88 %-100 %] 97 % (06/26 1800) Weight:  [72.7 kg (160 lb 4.4 oz)] 72.7 kg (160 lb 4.4 oz) (06/26 0900)  Cardiovascular: Skin warm; not flushed Respiratory: Breaths quiet; no shortness of breath Abdomen: No masses Neurological: Normal sensation to touch Musculoskeletal: Normal motor function arms and legs Lymphatics: No inguinal adenopathy Skin: No rashes Genitourinary:Penis is uncircumcised.  Scrotum is normal in appearance.  Testicles are normal.  Laboratory Data:  Results for orders placed during the hospital encounter of 12/28/13 (from the past 72 hour(s))  CBC WITH DIFFERENTIAL  Status: Abnormal   Collection Time    12/29/13  1:09 AM      Result Value Ref Range   WBC 23.9 (*) 4.0 - 10.5 K/uL   RBC 3.66 (*) 4.22 - 5.81 MIL/uL   Hemoglobin 10.6 (*) 13.0 - 17.0 g/dL   HCT 31.3 (*) 39.0 - 52.0 %   MCV 85.5  78.0 - 100.0 fL   MCH 29.0  26.0 - 34.0 pg   MCHC 33.9  30.0 - 36.0 g/dL   RDW 13.7  11.5 - 15.5 %   Platelets 190  150 - 400 K/uL   Neutrophils Relative % 87 (*) 43 - 77 %   Neutro Abs 20.7 (*) 1.7 - 7.7 K/uL   Lymphocytes Relative 5 (*) 12 - 46  %   Lymphs Abs 1.2  0.7 - 4.0 K/uL   Monocytes Relative 8  3 - 12 %   Monocytes Absolute 1.9 (*) 0.1 - 1.0 K/uL   Eosinophils Relative 0  0 - 5 %   Eosinophils Absolute 0.0  0.0 - 0.7 K/uL   Basophils Relative 0  0 - 1 %   Basophils Absolute 0.0  0.0 - 0.1 K/uL  COMPREHENSIVE METABOLIC PANEL     Status: Abnormal   Collection Time    12/29/13  1:09 AM      Result Value Ref Range   Sodium 133 (*) 137 - 147 mEq/L   Potassium 4.6  3.7 - 5.3 mEq/L   Chloride 96  96 - 112 mEq/L   CO2 21  19 - 32 mEq/L   Glucose, Bld 304 (*) 70 - 99 mg/dL   BUN 45 (*) 6 - 23 mg/dL   Creatinine, Ser 1.93 (*) 0.50 - 1.35 mg/dL   Calcium 8.8  8.4 - 10.5 mg/dL   Total Protein 6.4  6.0 - 8.3 g/dL   Albumin 3.4 (*) 3.5 - 5.2 g/dL   AST 13  0 - 37 U/L   ALT 7  0 - 53 U/L   Alkaline Phosphatase 61  39 - 117 U/L   Total Bilirubin 0.7  0.3 - 1.2 mg/dL   GFR calc non Af Amer 28 (*) >90 mL/min   GFR calc Af Amer 33 (*) >90 mL/min   Comment: (NOTE)     The eGFR has been calculated using the CKD EPI equation.     This calculation has not been validated in all clinical situations.     eGFR's persistently <90 mL/min signify possible Chronic Kidney     Disease.  URINALYSIS, ROUTINE W REFLEX MICROSCOPIC     Status: Abnormal   Collection Time    12/29/13  2:01 AM      Result Value Ref Range   Color, Urine RED (*) YELLOW   Comment: BIOCHEMICALS MAY BE AFFECTED BY COLOR   APPearance CLOUDY (*) CLEAR   Specific Gravity, Urine 1.023  1.005 - 1.030   pH 5.0  5.0 - 8.0   Glucose, UA NEGATIVE  NEGATIVE mg/dL   Hgb urine dipstick LARGE (*) NEGATIVE   Bilirubin Urine LARGE (*) NEGATIVE   Ketones, ur 40 (*) NEGATIVE mg/dL   Protein, ur >300 (*) NEGATIVE mg/dL   Urobilinogen, UA 1.0  0.0 - 1.0 mg/dL   Nitrite POSITIVE (*) NEGATIVE   Leukocytes, UA LARGE (*) NEGATIVE  URINE MICROSCOPIC-ADD ON     Status: None   Collection Time    12/29/13  2:01 AM      Result Value Ref Range  RBC / HPF TOO NUMEROUS TO COUNT  <3  RBC/hpf   Urine-Other URINALYSIS PERFORMED ON SUPERNATANT     Comment: FIELD OBSCURED BY RBC'S  MRSA PCR SCREENING     Status: None   Collection Time    12/29/13  8:53 AM      Result Value Ref Range   MRSA by PCR NEGATIVE  NEGATIVE   Comment:            The GeneXpert MRSA Assay (FDA     approved for NASAL specimens     only), is one component of a     comprehensive MRSA colonization     surveillance program. It is not     intended to diagnose MRSA     infection nor to guide or     monitor treatment for     MRSA infections.  GLUCOSE, CAPILLARY     Status: Abnormal   Collection Time    12/29/13 10:02 AM      Result Value Ref Range   Glucose-Capillary 139 (*) 70 - 99 mg/dL  HEMOGLOBIN A1C     Status: Abnormal   Collection Time    12/29/13 11:19 AM      Result Value Ref Range   Hemoglobin A1C 7.5 (*) <5.7 %   Comment: (NOTE)                                                                               According to the ADA Clinical Practice Recommendations for 2011, when     HbA1c is used as a screening test:      >=6.5%   Diagnostic of Diabetes Mellitus               (if abnormal result is confirmed)     5.7-6.4%   Increased risk of developing Diabetes Mellitus     References:Diagnosis and Classification of Diabetes Mellitus,Diabetes     FGHW,2993,71(IRCVE 1):S62-S69 and Standards of Medical Care in             Diabetes - 2011,Diabetes Care,2011,34 (Suppl 1):S11-S61.   Mean Plasma Glucose 169 (*) <117 mg/dL   Comment: Performed at [place], BLOOD     Status: Abnormal   Collection Time    12/29/13 11:19 AM      Result Value Ref Range   Hemoglobin 8.4 (*) 13.0 - 17.0 g/dL   Comment: REPEATED TO VERIFY     DELTA CHECK NOTED   HCT 24.7 (*) 39.0 - 52.0 %  GLUCOSE, CAPILLARY     Status: Abnormal   Collection Time    12/29/13 12:35 PM      Result Value Ref Range   Glucose-Capillary 149 (*) 70 - 99 mg/dL   Comment 1 Notify RN      Comment 2 Documented in Chart    HEMOGLOBIN AND HEMATOCRIT, BLOOD     Status: Abnormal   Collection Time    12/29/13  6:20 PM      Result Value Ref Range   Hemoglobin 8.4 (*) 13.0 - 17.0 g/dL   HCT 25.2 (*) 39.0 - 52.0 %   Recent Results (from the past 240 hour(s))  URINE CULTURE     Status: None   Collection Time    12/28/13  6:36 AM      Result Value Ref Range Status   Specimen Description URINE, RANDOM   Final   Special Requests NONE   Final   Culture  Setup Time     Final   Value: 12/28/2013 12:43     Performed at [facility]     Final   Value: 60,000 COLONIES/ML     Performed at [facility]   Culture     Final   Value: Multiple bacterial morphotypes present, none predominant. Suggest appropriate recollection if clinically indicated.     Performed at [facility]   Report Status 12/29/2013 FINAL   Final  MRSA PCR SCREENING     Status: None   Collection Time    12/29/13  8:53 AM      Result Value Ref Range Status   MRSA by PCR NEGATIVE  NEGATIVE Final   Comment:            The GeneXpert MRSA Assay (FDA     approved for NASAL specimens     only), is one component of a     comprehensive MRSA colonization     surveillance program. It is not     intended to diagnose MRSA     infection nor to guide or     monitor treatment for     MRSA infections.   Creatinine:  Recent Labs  12/28/13 0919 12/29/13 0109  CREATININE 0.99 1.93*    Xrays: See report/chart Renal ultrasound showed a 1 cm focus in the right kidney consistent with renal stone. There are also several hypoechoic left renal masses consistent with renal cysts.  Impression/Assessment:  Gross hematuria.  Renal insufficiency. The hematuria is probably of prostatic origin. Right renal calculus that could also be the source of the hematuria. Left renal cysts.  Plan:  Irrigate catheter as needed.  Start CBI if hematuria recurs. He may need a CT scan for further evaluation  of the hematuria. Dr. Jasmine December will follow the patient.  Arvil Persons 12/29/2013, 6:56 PM

## 2013-12-29 NOTE — ED Notes (Signed)
Pt.'s foley irrigated continuously , large amount of blood clots still noted. Blood also noted from penis in minimal amount. MD aware.

## 2013-12-29 NOTE — Consult Note (Signed)
PULMONARY / CRITICAL CARE MEDICINE   Name: Carlos Morton MRN: [id number] DOB: [birth year]    ADMISSION DATE:  12/28/2013 CONSULTATION DATE:  12/29/13  REFERRING MD :  Dr. Blake DivineAkula  PRIMARY SERVICE: TRH   CHIEF COMPLAINT:  Hypotension, hematuria.   BRIEF PATIENT DESCRIPTION: 80 y/o M with PMH of DM, HTN, Hypothyroidism, AFIB, CHF, COPD who was seen in ER on 6/25 for urinary retention, hematuria & UTI.  Catheter inserted, then pt developed pain, was changed to a coude, urine cleared to 'cherry lemonade' and discharged to ALF with keflex.  [facility] with gross hematuria & hypotension.  PCCM consulted for evaluation.  Urologist -wofford - underwent cystoscopy 5 wksa go for hematuria - no bladder tumors noted   SIGNIFICANT EVENTS / STUDIES:  6/25 - ER visit for urinary retention, d/c back to ALF with UTI  ........................... 6/26 - Admit with hypotension, hematuria, UTI  LINES / TUBES:   CULTURES: UC 6/25 >> 60k multiple morphotypes   ANTIBIOTICS: Rocephin 6/26 >>   HISTORY OF PRESENT ILLNESS:  80 y/o M with PMH of DM, HTN, Hypothyroidism, AFIB, CHF, COPD who was seen in ER on 6/25 for urinary retention, hematuria & UTI.  A catheter was inserted, he then pt developed pain post placement.  Findings discussed with Urology & was changed to a coude.  Post change, urine cleared to 'cherry lemonade' and discharged to ALF with keflex.  He developed frank blood in urine overnight.  [facility] with gross hematuria & hypotension. Catheter was inserted and irrigated with multiple clots returned.  ER evaluation noted a 1 gm drop in Hgb, low blood pressure with readings in the 70's, increased WBC's and AKI.  PCCM consulted for evaluation.     PAST MEDICAL HISTORY :  Past Medical History  Diagnosis Date  . Hypertension   . Diabetes mellitus   . Thyroid disease   . High cholesterol   . Atrial fibrillation   . CHF (congestive heart failure)   .  Emphysema     35% of lungs   Past Surgical History  Procedure Laterality Date  . Carotid stent      on blood thinner for stent  . Replacement total knee      left knee  . Appendectomy     Prior to Admission medications   Medication Sig Start Date End Date Taking? Authorizing Provider  albuterol (PROVENTIL) (2.5 MG/3ML) 0.083% nebulizer solution Take 2.5 mg by nebulization every 6 (six) hours as needed for wheezing or shortness of breath.   Yes Historical Provider, MD  aspirin EC 81 MG tablet Take 81 mg by mouth daily.   Yes Historical Provider, MD  Camphor (JOINTFLEX) 3.1 % CREA Apply 1 application topically 2 (two) times daily as needed (pain).   Yes Historical Provider, MD  cephALEXin (KEFLEX) 500 MG capsule Take 1 capsule (500 mg total) by mouth 4 (four) times daily. 12/28/13  Yes Mora BellmanHannah S Merrell, PA-C  clopidogrel (PLAVIX) 75 MG tablet Take 75 mg by mouth daily.   Yes Historical Provider, MD  furosemide (LASIX) 40 MG tablet Take 1 tablet (40 mg total) by mouth 2 (two) times daily. 08/07/13  Yes Flint MelterElliott L Wentz, MD  glimepiride (AMARYL) 2 MG tablet Take 2 mg by mouth 2 (two) times daily.   Yes Historical Provider, MD  insulin glargine (LANTUS) 100 UNIT/ML injection Inject 0.1 mLs (10 Units total) into the skin at bedtime. 06/23/13  Yes  Jeralyn BennettEzequiel Zamora, MD  isosorbide mononitrate (IMDUR) 60 MG 24 hr tablet Take 60 mg by mouth daily.   Yes Historical Provider, MD  levothyroxine (SYNTHROID, LEVOTHROID) 50 MCG tablet Take 50 mcg by mouth daily before breakfast.   Yes Historical Provider, MD  lisinopril (ZESTRIL) 2.5 MG tablet Take 1 tablet (2.5 mg total) by mouth daily. 06/23/13  Yes Jeralyn BennettEzequiel Zamora, MD  LORazepam (ATIVAN) 0.5 MG tablet Take 1 tablet (0.5 mg total) by mouth at bedtime. 06/23/13  Yes Jeralyn BennettEzequiel Zamora, MD  lovastatin (MEVACOR) 20 MG tablet Take 20 mg by mouth at bedtime.   Yes Historical Provider, MD  Lubricants (SURGICAL LUBRICANT) gel Apply 1 application topically 3 (three)  times daily. Apply to tip of penis for irritation or discomfort   Yes Historical Provider, MD  OVER THE COUNTER MEDICATION Apply 1 application topically 3 (three) times daily as needed (joint pain). [facility] Ointment   Yes Historical Provider, MD  Polyethyl Glycol-Propyl Glycol (SYSTANE OP) Apply 1-2 drops to eye daily as needed (dry eyes).   Yes Historical Provider, MD  polyethylene glycol (MIRALAX / GLYCOLAX) packet Take 17 g by mouth 2 (two) times daily as needed for mild constipation.   Yes Historical Provider, MD  potassium chloride SA (K-DUR,KLOR-CON) 20 MEQ tablet Take 20 mEq by mouth daily.   Yes Historical Provider, MD  senna (SENOKOT) 8.6 MG TABS tablet Take 1 tablet by mouth at bedtime as needed for mild constipation.   Yes Historical Provider, MD  oxybutynin (DITROPAN) 5 MG tablet Take 0.5 tablets (2.5 mg total) by mouth once. 12/28/13   Mora BellmanHannah S Merrell, PA-C   Allergies  Allergen Reactions  . Codeine Other (See Comments)    unknown  . Demerol [Meperidine] Other (See Comments)     Unknown per pt  mar  . Diltiazem Other (See Comments)     Unknown per pt  mar  . Penicillins Other (See Comments)     Unknown per pt  mar  . Sulfa Drugs Cross Reactors Hives    [facility]    FAMILY HISTORY:  History reviewed. No pertinent family history. SOCIAL HISTORY:  reports that he has quit smoking. His smoking use included Cigarettes. He smoked 3.00 packs per day. He has never used smokeless tobacco. He reports that he does not drink alcohol or use illicit drugs.  REVIEW OF SYSTEMS:   Gen: Denies fever, chills, weight change, fatigue, night sweats HEENT: Denies blurred vision, double vision, hearing loss, tinnitus, sinus congestion, rhinorrhea, sore throat, neck stiffness, dysphagia PULM: Denies shortness of breath, cough, sputum production, hemoptysis, wheezing.  Reports development of hoarseness overnight. Denies nasal drainage, sore throat, reflux sx.   CV: Denies chest pain,  edema, orthopnea, paroxysmal nocturnal dyspnea, palpitations GI: Denies abdominal pain, nausea, vomiting, diarrhea, hematochezia, melena, constipation, change in bowel habits GU: Denies dysuria, polyuria, oliguria, urethral discharge.  Reports bloody urine.   Endocrine: Denies hot or cold intolerance, polyuria, polyphagia or appetite change Derm: Denies rash, dry skin, scaling or peeling skin change Heme: Denies easy bruising, bleeding, bleeding gums Neuro: Denies headache, numbness, weakness, slurred speech, loss of memory or consciousness   SUBJECTIVE:   VITAL SIGNS: Temp:  [98 F (36.7 C)-100.6 F (38.1 C)] 98 F (36.7 C) (06/26 0900) Pulse Rate:  [46-110] 109 (06/26 1200) Resp:  [10-39] 16 (06/26 1200) BP: (74-153)/(17-97) 98/40 mmHg (06/26 1200) SpO2:  [88 %-100 %] 90 % (06/26 1200) Weight:  [160 lb 4.4 oz (72.7 kg)] 160 lb 4.4 oz (  72.7 kg) (06/26 0900)  HEMODYNAMICS:    VENTILATOR SETTINGS:    INTAKE / OUTPUT: Intake/Output     06/25 0701 - 06/26 0700 06/26 0701 - 06/27 0700   P.O.  60   I.V. (mL/kg)  1208.8 (16.6)   Other 2000    IV Piggyback  500   Total Intake(mL/kg) 2000 1768.8 (24.3)   Urine (mL/kg/hr)  225 (0.5)   Other 2100    Total Output 2100 225   Net -100 +1543.8          PHYSICAL EXAMINATION: General:  wdwn elderly male in NAD Neuro:  AAOx4, speech clear, MAE  HEENT:  Mm pink/dry, no jvd Cardiovascular:  s1s2 rrr, no m/r/g Lungs:  resp's even/non-labored, lungs bilaterally clear Abdomen:  Round/soft, bsx4 active  Musculoskeletal:  No acute deformities , peripheral pulses + Skin:  Warm/dry, no edema , good cap refill  LABS:  CBC  Recent Labs Lab 12/28/13 0919 12/29/13 0109  WBC 14.8* 23.9*  HGB 11.7* 10.6*  HCT 35.2* 31.3*  PLT 193 190   Coag's No results found for this basename: APTT, INR,  in the last 168 hours  BMET  Recent Labs Lab 12/28/13 0919 12/29/13 0109  NA 140 133*  K 4.2 4.6  CL 100 96  CO2 24 21  BUN 28* 45*   CREATININE 0.99 1.93*  GLUCOSE 216* 304*   Electrolytes  Recent Labs Lab 12/28/13 0919 12/29/13 0109  CALCIUM 8.9 8.8   Sepsis Markers No results found for this basename: LATICACIDVEN, PROCALCITON, O2SATVEN,  in the last 168 hours  ABG No results found for this basename: PHART, PCO2ART, PO2ART,  in the last 168 hours  Liver Enzymes  Recent Labs Lab 12/28/13 0919 12/29/13 0109  AST 12 13  ALT 8 7  ALKPHOS 64 61  BILITOT 0.6 0.7  ALBUMIN 3.6 3.4*   Cardiac Enzymes No results found for this basename: TROPONINI, PROBNP,  in the last 168 hours Glucose  Recent Labs Lab 12/29/13 1002  GLUCAP 139*    Imaging US Renal  12/29/2013   CLINICAL DATA:  Acute renal failure, hematuria  EXAM: RENAL/URINARY TRACT ULTRASOUND COMPLETE  COMPARISON:  None.  FINDINGS: Right Kidney:  Length: 11.2 cm. Echogenicity within normal limits. No mass or hydronephrosis visualized. Shadowing hyperechoic 10 mm focus in the right kidney consistent with nephrolithiasis.  Left Kidney:  Length: 11.7 cm. Echogenicity within normal limits. No hydronephrosis visualized. Multiple anechoic left renal mass is with the largest measuring 3.9 x 3.3 x 3.8 cm most consistent with cysts.  Bladder:  Foley catheter within the bladder. There is a hypoechoic multi septated mass surrounding the Foley catheter likely representing a hematoma.  IMPRESSION: 1. Nonobstructing right nephrolithiasis. 2. Left renal cysts. 3. Hypo: Multi-septated mass around the Foley catheter within the bladder likely representing a hematoma.   Electronically Signed   By: Elige Ko   On: 12/29/2013 11:15   Dg Pelvis Portable  12/28/2013   CLINICAL DATA:  Hematuria  EXAM: PORTABLE PELVIS 1-2 VIEWS  COMPARISON:  None.  FINDINGS: Pelvic structures are within normal limits. By history there is an indwelling Foley catheter although this is not well visualized on this exam. If necessary contrast injection could be performed to assess placement.  Degenerative changes of the lumbar spine are seen.  IMPRESSION: Nonspecific pelvis. If clinically indicated injection in the Foley catheter could be performed to assess for placement. It is not well visualized on this exam.   Electronically Signed  By: Alcide Clever M.D.   On: 12/28/2013 08:38    ASSESSMENT / PLAN:  PULMONARY A: Hoarse Voice - new development overnight 6/26.  Denies PND, GERD.  Mouth dry on exam.   COPD - without acute exacerbation, former tobacco abuse (quit >50 yrs ago) P:   Assess CXR for completeness Pulmonary hygiene  Continue BD's  CARDIOVASCULAR A:  Sepsis - secondary to UTI Hypotension - resolved with IVF Hx HTN, HLD Hx Carotid Stent - on plavix Atrial Fibrillation - hx of, not on anti-coagulation P:  SDU monitoring IVF / volume resuscitation  Hold plavix, ASA, lisinopril, lasix, imdur  RENAL / GU  A:   Acute Kidney Injury  Hematuria  P:   Urology Consult Monitor Sr Cr trend , hold lisnopril Continue ditropan  Foley to monitor output ? Need for continuous bladder irrigation  GASTROINTESTINAL A:   No acute issues  P:   NPO until evaluated by Urology, then ok to proceed with diet.  HEMATOLOGIC A:   Anemia - approximately 1 gm drop since 6/25 P:  Consider tx if hgb <7%, active bleeding or MI <8% DVT Proph: SCDs  INFECTIOUS A:   UTI Severe Sepsis  P:   chk lactate ABX as above Follow cultures  ENDOCRINE A:   Diabetes  P:   SSI while inpatient  Hold home amaryl,   NEUROLOGIC A:   No acute issues.  P:   RASS goal: n/a Supportive Care  GLOBAL: unclear cause of hematuria -may be related to prostate or UTI With low grade fever & leucocytosis, sepsis possible - check lactate , responded well to fluids indicating hypovolemia Discussion with patient and daughter regarding code status.  Patient wishes for natural death.  He indicates he would not want CPR or mechanical ventilation.  Daughter agrees.     Canary Brim,  NP-C [place] [facility] Pgr: [phone] or [phone]    I have personally obtained a history, examined the patient, evaluated laboratory and imaging results, formulated the assessment and plan and placed orders.  Cyril Mourning MD. Tonny Bollman. [place] [facility] Pager [phone] If no response call [phone]     12/29/2013, 12:39 PM

## 2013-12-29 NOTE — ED Provider Notes (Signed)
Medical screening examination/treatment/procedure(s) were conducted as a shared visit with non-physician practitioner(s) and myself.  I personally evaluated the patient during the encounter.   EKG Interpretation None        Joya Gaskinsonald W Wickline, MD 12/29/13 1126

## 2013-12-30 ENCOUNTER — Inpatient Hospital Stay (HOSPITAL_COMMUNITY): Payer: Medicare Other

## 2013-12-30 DIAGNOSIS — E118 Type 2 diabetes mellitus with unspecified complications: Secondary | ICD-10-CM

## 2013-12-30 LAB — BASIC METABOLIC PANEL
BUN: 30 mg/dL — AB (ref 6–23)
CO2: 20 mEq/L (ref 19–32)
Calcium: 8.1 mg/dL — ABNORMAL LOW (ref 8.4–10.5)
Chloride: 109 mEq/L (ref 96–112)
Creatinine, Ser: 1.37 mg/dL — ABNORMAL HIGH (ref 0.50–1.35)
GFR calc Af Amer: 49 mL/min — ABNORMAL LOW (ref >90)
GFR calc non Af Amer: 42 mL/min — ABNORMAL LOW (ref >90)
GLUCOSE: 142 mg/dL — AB (ref 70–99)
POTASSIUM: 4.1 meq/L (ref 3.7–5.3)
Sodium: 138 mEq/L (ref 137–147)

## 2013-12-30 LAB — GLUCOSE, CAPILLARY
GLUCOSE-CAPILLARY: 140 mg/dL — AB (ref 70–99)
Glucose-Capillary: 165 mg/dL — ABNORMAL HIGH (ref 70–99)
Glucose-Capillary: 133 mg/dL — ABNORMAL HIGH (ref 70–99)
Glucose-Capillary: 133 mg/dL — ABNORMAL HIGH (ref 70–99)

## 2013-12-30 LAB — CBC
HEMATOCRIT: 25.4 % — AB (ref 39.0–52.0)
HEMOGLOBIN: 8.6 g/dL — AB (ref 13.0–17.0)
MCH: 29.8 pg (ref 26.0–34.0)
MCHC: 33.9 g/dL (ref 30.0–36.0)
MCV: 87.9 fL (ref 78.0–100.0)
Platelets: 116 10*3/uL — ABNORMAL LOW (ref 150–400)
RBC: 2.89 MIL/uL — ABNORMAL LOW (ref 4.22–5.81)
RDW: 14 % (ref 11.5–15.5)
WBC: 21.6 10*3/uL — ABNORMAL HIGH (ref 4.0–10.5)

## 2013-12-30 LAB — HEMOGLOBIN AND HEMATOCRIT, BLOOD
HCT: 25.4 % — ABNORMAL LOW (ref 39.0–52.0)
HEMATOCRIT: 23.9 % — AB (ref 39.0–52.0)
HEMOGLOBIN: 8.1 g/dL — AB (ref 13.0–17.0)
Hemoglobin: 8.6 g/dL — ABNORMAL LOW (ref 13.0–17.0)

## 2013-12-30 LAB — LACTIC ACID, PLASMA: Lactic Acid, Venous: 1.1 mmol/L (ref 0.5–2.2)

## 2013-12-30 MED ORDER — LORAZEPAM 2 MG/ML IJ SOLN
INTRAMUSCULAR | Status: AC
  Administered 2013-12-30: 1 mg via INTRAVENOUS
  Filled 2013-12-30: qty 1

## 2013-12-30 MED ORDER — SODIUM CHLORIDE 0.9 % IV BOLUS (SEPSIS)
500.0000 mL | Freq: Once | INTRAVENOUS | Status: AC
  Administered 2013-12-30: 500 mL via INTRAVENOUS

## 2013-12-30 MED ORDER — LORAZEPAM 2 MG/ML IJ SOLN
1.0000 mg | Freq: Once | INTRAMUSCULAR | Status: AC
  Administered 2013-12-30: 1 mg via INTRAVENOUS

## 2013-12-30 MED ORDER — ACETAMINOPHEN 325 MG PO TABS
650.0000 mg | ORAL_TABLET | Freq: Once | ORAL | Status: AC
  Administered 2013-12-30: 650 mg via ORAL
  Filled 2013-12-30: qty 2

## 2013-12-30 NOTE — Progress Notes (Signed)
Subjective: Patient is resting comfortably this morning. Urine grossly clear without bladder irrigation. Dr. Madilyn HookNesi's note and patient's history reviewed. Hemoglobin relatively stable. Creatinine yesterday worsened significantly. Repeat renal function from today is pending at this time. Patient is without obvious new complaints.  Objective: Vital signs in last 24 hours: Temp:  [97.7 F (36.5 C)-101.4 F (38.6 C)] 101.4 F (38.6 C) (06/27 0450) Pulse Rate:  [34-121] 108 (06/27 0700) Resp:  [13-34] 17 (06/27 0700) BP: (57-123)/(17-92) 96/32 mmHg (06/27 0700) SpO2:  [88 %-100 %] 98 % (06/27 0700) Weight:  [160 lb 4.4 oz (72.7 kg)-168 lb 14 oz (76.6 kg)] 168 lb 14 oz (76.6 kg) (06/27 0400)  Intake/Output from previous day: 06/26 0701 - 06/27 0700 In: 6728.8 [P.O.:60; I.V.:2558.8; IV Piggyback:1050] Out: 5970 [Urine:5970] Intake/Output this shift:    Physical Exam:  Constitutional: Vital signs reviewed. WD WN in NAD   Eyes: PERRL, No scleral icterus.   Cardiovascular: RRR Pulmonary/Chest: Normal effort Abdominal: Soft. Non-tender  Genitourinary: Three-way Foley catheter draining relatively clear urine. Otherwise no change Extremities: No cyanosis or edema   Lab Results:  Recent Labs  12/29/13 1119 12/29/13 1820 12/30/13 0230  HGB 8.4* 8.4* 8.1*  HCT 24.7* 25.2* 23.9*   BMET  Recent Labs  12/28/13 0919 12/29/13 0109  NA 140 133*  K 4.2 4.6  CL 100 96  CO2 24 21  GLUCOSE 216* 304*  BUN 28* 45*  CREATININE 0.99 1.93*  CALCIUM 8.9 8.8   No results found for this basename: LABPT, INR,  in the last 72 hours No results found for this basename: LABURIN,  in the last 72 hours Results for orders placed during the hospital encounter of 12/28/13  MRSA PCR SCREENING     Status: None   Collection Time    12/29/13  8:53 AM      Result Value Ref Range Status   MRSA by PCR NEGATIVE  NEGATIVE Final   Comment:            The GeneXpert MRSA Assay (FDA     approved for NASAL  specimens     only), is one component of a     comprehensive MRSA colonization     surveillance program. It is not     intended to diagnose MRSA     infection nor to guide or     monitor treatment for     MRSA infections.    Studies/Results: [place] Renal  12/29/2013   CLINICAL DATA:  Acute renal failure, hematuria  EXAM: RENAL/URINARY TRACT ULTRASOUND COMPLETE  COMPARISON:  None.  FINDINGS: Right Kidney:  Length: 11.2 cm. Echogenicity within normal limits. No mass or hydronephrosis visualized. Shadowing hyperechoic 10 mm focus in the right kidney consistent with nephrolithiasis.  Left Kidney:  Length: 11.7 cm. Echogenicity within normal limits. No hydronephrosis visualized. Multiple anechoic left renal mass is with the largest measuring 3.9 x 3.3 x 3.8 cm most consistent with cysts.  Bladder:  Foley catheter within the bladder. There is a hypoechoic multi septated mass surrounding the Foley catheter likely representing a hematoma.  IMPRESSION: 1. Nonobstructing right nephrolithiasis. 2. Left renal cysts. 3. Hypo: Multi-septated mass around the Foley catheter within the bladder likely representing a hematoma.   Electronically Signed   By: Elige KoHetal  Patel   On: 12/29/2013 11:15   Dg Pelvis Portable  12/28/2013   CLINICAL DATA:  Hematuria  EXAM: PORTABLE PELVIS 1-2 VIEWS  COMPARISON:  None.  FINDINGS: Pelvic structures are within normal limits. By  history there is an indwelling Foley catheter although this is not well visualized on this exam. If necessary contrast injection could be performed to assess placement. Degenerative changes of the lumbar spine are seen.  IMPRESSION: Nonspecific pelvis. If clinically indicated injection in the Foley catheter could be performed to assess for placement. It is not well visualized on this exam.   Electronically Signed   By: Alcide CleverMark  Lukens M.D.   On: 12/28/2013 08:38   Dg Chest Port 1 View  12/29/2013   CLINICAL DATA:  Hoarse voice  EXAM: PORTABLE CHEST - 1 VIEW   COMPARISON:  08/07/2013  FINDINGS: Bilateral diffuse interstitial thickening likely chronic. There is no focal parenchymal opacity, pleural effusion, or pneumothorax. The heart and mediastinal contours are unremarkable.  Osteoarthritis of bilateral glenohumeral joints.  IMPRESSION: Diffuse bilateral interstitial thickening likely chronic. Superimposed mild interstitial edema or atypical infection cannot be excluded.   Electronically Signed   By: Elige KoHetal  Patel   On: 12/29/2013 14:22    Assessment/Plan:   Gross hematuria probably secondary to BPH. Currently improved without ongoing hematuria. Plan per Dr. Madilyn HookNesi's note yesterday   LOS: 2 days   GRAPEY,DAVID S 12/30/2013, 8:11 AM

## 2013-12-30 NOTE — Progress Notes (Signed)
PULMONARY / CRITICAL CARE MEDICINE   Name: Carlos Morton MRN: [id number] DOB: 26-Jun-1917    ADMISSION DATE:  12/28/2013 CONSULTATION DATE:  12/29/13  REFERRING MD :  Dr. Blake Divine  PRIMARY SERVICE: TRH   CHIEF COMPLAINT:  Hypotension, hematuria.   BRIEF PATIENT DESCRIPTION: 80 y/o M with PMH of DM, HTN, Hypothyroidism, AFIB, CHF, COPD who was seen in ER on 6/25 for urinary retention, hematuria & UTI.  Catheter inserted, then pt developed pain, was changed to a coude, urine cleared to 'cherry lemonade' and discharged to ALF with keflex.  Patient returned to [facility] ER on 6/26 with gross hematuria & hypotension.  PCCM consulted for evaluation.  Urologist -wo[place]ord - underwent cystoscopy 5 wksa go for hematuria - no bladder tumors noted   SIGNIFICANT EVENTS / STUDIES:  6/25 - ER visit for urinary retention, d/c back to ALF with UTI  ........................... 6/26 - Admit with hypotension, hematuria, UTI  LINES / TUBES:   CULTURES: UC 6/25 >> 60k multiple morphotypes   ANTIBIOTICS: Rocephin 6/26 >>      SUBJECTIVE: Some hypotension overnight , but mental status appears great Febrile Denies pain Hematuria -clearing  VITAL SIGNS: Temp:  [97.7 F (36.5 C)-101.4 F (38.6 C)] 98.3 F (36.8 C) (06/27 0800) Pulse Rate:  [34-121] 108 (06/27 0700) Resp:  [13-34] 17 (06/27 0700) BP: (57-123)/(17-92) 96/32 mmHg (06/27 0700) SpO2:  [88 %-100 %] 98 % (06/27 0700) Weight:  [76.6 kg (168 lb 14 oz)] 76.6 kg (168 lb 14 oz) (06/27 0400)  HEMODYNAMICS:    VENTILATOR SETTINGS:    INTAKE / OUTPUT: Intake/Output     06/26 0701 - 06/27 0700 06/27 0701 - 06/28 0700   P.O. 60    I.V. (mL/kg) 2558.8 (33.4)    Other 3060    IV Piggyback 1050    Total Intake(mL/kg) 6728.8 (87.8)    Urine (mL/kg/hr) 5970 (3.2)    Other     Total Output 5970     Net +758.8            PHYSICAL EXAMINATION: General:  wdwn elderly male in NAD Neuro:  AAOx4, speech clear, MAE  HEENT:  Mm pink/dry, no  jvd Cardiovascular:  s1s2 rrr, no m/r/g Lungs:  resp's even/non-labored, lungs bilaterally clear Abdomen:  Round/soft, bsx4 active  Musculoskeletal:  No acute deformities , peripheral pulses + Skin:  Warm/dry, no edema , good cap refill  LABS:  CBC  Recent Labs Lab 12/28/13 0919 12/29/13 0109  12/29/13 1820 12/30/13 0230 12/30/13 1036  WBC 14.8* 23.9*  --   --   --   --   HGB 11.7* 10.6*  < > 8.4* 8.1* 8.6*  HCT 35.2* 31.3*  < > 25.2* 23.9* 25.4*  PLT 193 190  --   --   --   --   < > = values in this interval not displayed. Coag's No results found for this basename: APTT, INR,  in the last 168 hours  BMET  Recent Labs Lab 12/28/13 0919 12/29/13 0109  NA 140 133*  K 4.2 4.6  CL 100 96  CO2 24 21  BUN 28* 45*  CREATININE 0.99 1.93*  GLUCOSE 216* 304*   Electrolytes  Recent Labs Lab 12/28/13 0919 12/29/13 0109  CALCIUM 8.9 8.8   Sepsis Markers  Recent Labs Lab 12/30/13 0230  LATICACIDVEN 1.1    ABG No results found for this basename: PHART, PCO2ART, PO2ART,  in the last 168 hours  Liver Enzymes  Recent Labs Lab  12/28/13 0919 12/29/13 0109  AST 12 13  ALT 8 7  ALKPHOS 64 61  BILITOT 0.6 0.7  ALBUMIN 3.6 3.4*   Cardiac Enzymes No results found for this basename: TROPONINI, PROBNP,  in the last 168 hours Glucose  Recent Labs Lab 12/29/13 1002 12/29/13 1235 12/29/13 1742 12/29/13 2209 12/30/13 0743  GLUCAP 139* 149* 171* 105* 165*    Imaging [place] Renal  12/29/2013   CLINICAL DATA:  Acute renal failure, hematuria  EXAM: RENAL/URINARY TRACT ULTRASOUND COMPLETE  COMPARISON:  None.  FINDINGS: Right Kidney:  Length: 11.2 cm. Echogenicity within normal limits. No mass or hydronephrosis visualized. Shadowing hyperechoic 10 mm focus in the right kidney consistent with nephrolithiasis.  Left Kidney:  Length: 11.7 cm. Echogenicity within normal limits. No hydronephrosis visualized. Multiple anechoic left renal mass is with the largest measuring 3.9  x 3.3 x 3.8 cm most consistent with cysts.  Bladder:  Foley catheter within the bladder. There is a hypoechoic multi septated mass surrounding the Foley catheter likely representing a hematoma.  IMPRESSION: 1. Nonobstructing right nephrolithiasis. 2. Left renal cysts. 3. Hypo: Multi-septated mass around the Foley catheter within the bladder likely representing a hematoma.   Electronically Signed   By: Elige KoHetal  Patel   On: 12/29/2013 11:15   Dg Chest Port 1 View  12/29/2013   CLINICAL DATA:  Hoarse voice  EXAM: PORTABLE CHEST - 1 VIEW  COMPARISON:  08/07/2013  FINDINGS: Bilateral diffuse interstitial thickening likely chronic. There is no focal parenchymal opacity, pleural effusion, or pneumothorax. The heart and mediastinal contours are unremarkable.  Osteoarthritis of bilateral glenohumeral joints.  IMPRESSION: Diffuse bilateral interstitial thickening likely chronic. Superimposed mild interstitial edema or atypical infection cannot be excluded.   Electronically Signed   By: Elige KoHetal  Patel   On: 12/29/2013 14:22    ASSESSMENT / PLAN:  PULMONARY A: Hoarse Voice - new development overnight 6/26 -improving COPD - without acute exacerbation, former tobacco abuse (quit >50 yrs ago) Chronic interstitial prominence on CXR  No ILD on CT abd 2010 P:   Assess CXR for completeness Pulmonary hygiene  Continue BD's  CARDIOVASCULAR A:  Sepsis - secondary to UTI Hypotension - resolved with IVF Hx HTN, HLD Hx Carotid Stent - on plavix Atrial Fibrillation - hx of, not on anti-coagulation P:  SDU monitoring IVFs Hold plavix, ASA, lisinopril, lasix, imdur  RENAL / GU  A:   Acute Kidney Injury  Hematuria -attributed to BPH, neg cystoscopy P:   Urology following Monitor Sr Cr trend , hold lisnopril Continue ditropan  Foley - continuous bladder irrigation  GASTROINTESTINAL A:   No acute issues  P:   advance diet.  HEMATOLOGIC A:   Anemia - approximately 1 gm drop since 6/25 P:  Consider tx  if hgb <7%, active bleeding or MI <8% DVT Proph: SCDs  INFECTIOUS A:   UTI Severe Sepsis , lactate 1.1 P:   ceftx - although listed as allergy , tolerated dose in ED Follow cultures  ENDOCRINE A:   Diabetes  P:   SSI while inpatient  Hold home amaryl,   NEUROLOGIC A:   No acute issues.  P:   mobilise  Supportive Care  GLOBAL: unclear cause of hematuria -may be related to BPH With low grade fever & leucocytosis, sepsis possible - lactate nml, responded well to fluids indicating hypovolemia Discussion with patient and daughter regarding code status.  Patient wishes for natural death.  He indicates he would not want CPR or mechanical ventilation.  PCCM to sign off     I have personally obtained a history, examined the patient, evaluated laboratory and imaging results, formulated the assessment and plan and placed orders.  Cyril Mourningakesh Alva MD. Tonny BollmanFCCP. [place] [facility] Pager [phone] If no response call [phone]     12/30/2013, 10:48 AM

## 2013-12-30 NOTE — Progress Notes (Signed)
[facility] PROGRESS NOTE  Carlos Morton ZDG:[id number] DOB: 11-Jul-1916 DOA: 12/28/2013 PCP: Karle Plumber, MD  Assessment/Plan: Sepsis secondary to UTI:  - he was  admitted to step down. Started patient on rocephin. Urine and blood cultures ordered and pending, hypotension secondary to sepsis. He is responding to fluids. He has received so far 6 liters of fluids and he has put our 5 liters of urine.  - IV hydration with normal saline,.  Fever, leukocytosis, hypotension:  - secondary to sepsis from UTI.  Hematuria:  - possibly traumatic. foleyin place, underwent irrigation in ED.  - urology consult from Dr Brunilda Payor requested and recommendations given.   Anemia of Blood loss:  -possibly from the hematuria.  - will transfuse if H&h drops to less than 7.  - monitor hemoglobin  - holding aspirin and plavix.  Diabetes Mellitus:  - ssi, resume lantus.  - hgba1c is 7.5.  Acute renal failure:  - probably from sepsis and UTI, dehdyration.  - hydration and repeated renal parameters in am show improvement..  DVT prophylaxis.  scd's   DNR NONE AT BEDSIDE Remain in step down.    Consultants:  pccm  Procedures:  CXR    Antibiotics:  Rocephin 6/26  HPI/Subjective: Confused, but knows he is in the hospital  Objective: Filed Vitals:   12/30/13 0800  BP:   Pulse:   Temp: 98.3 F (36.8 C)  Resp:     Intake/Output Summary (Last 24 hours) at 12/30/13 0921 Last data filed at 12/30/13 0600  Gross per 24 hour  Intake 5228.75 ml  Output   5970 ml  Net -741.25 ml   Filed Weights   12/29/13 0900 12/30/13 0400  Weight: 72.7 kg (160 lb 4.4 oz) 76.6 kg (168 lb 14 oz)    Exam:   General: sleeping comfortably, confused  Cardiovascular: s1s2  Respiratory: diminished air entry at bases.   Abdomen: soft NT ND BS+  Musculoskeletal: no pedal edema.   Data Reviewed: Basic Metabolic Panel:  Recent Labs Lab 12/28/13 0919 12/29/13 0109  NA 140 133*  K 4.2 4.6   CL 100 96  CO2 24 21  GLUCOSE 216* 304*  BUN 28* 45*  CREATININE 0.99 1.93*  CALCIUM 8.9 8.8   Liver Function Tests:  Recent Labs Lab 12/28/13 0919 12/29/13 0109  AST 12 13  ALT 8 7  ALKPHOS 64 61  BILITOT 0.6 0.7  PROT 6.7 6.4  ALBUMIN 3.6 3.4*   No results found for this basename: LIPASE, AMYLASE,  in the last 168 hours No results found for this basename: AMMONIA,  in the last 168 hours CBC:  Recent Labs Lab 12/28/13 0919 12/29/13 0109 12/29/13 1119 12/29/13 1820 12/30/13 0230  WBC 14.8* 23.9*  --   --   --   NEUTROABS 11.4* 20.7*  --   --   --   HGB 11.7* 10.6* 8.4* 8.4* 8.1*  HCT 35.2* 31.3* 24.7* 25.2* 23.9*  MCV 86.5 85.5  --   --   --   PLT 193 190  --   --   --    Cardiac Enzymes: No results found for this basename: CKTOTAL, CKMB, CKMBINDEX, TROPONINI,  in the last 168 hours BNP (last 3 results)  Recent Labs  06/21/13 1750 06/22/13 0330  PROBNP 2179.0* 2124.0*   CBG:  Recent Labs Lab 12/29/13 1002 12/29/13 1235 12/29/13 1742 12/29/13 2209 12/30/13 0743  GLUCAP 139* 149* 171* 105* 165*    Recent Results (from the past 240 hour(s))  URINE CULTURE     Status: None   Collection Time    12/28/13  6:36 AM      Result Value Ref Range Status   Specimen Description URINE, RANDOM   Final   Special Requests NONE   Final   Culture  Setup Time     Final   Value: 12/28/2013 12:43     Performed at [facility]   Colony Count     Final   Value: 60,000 COLONIES/ML     Performed at [facility]   Culture     Final   Value: Multiple bacterial morphotypes present, none predominant. Suggest appropriate recollection if clinically indicated.     Performed at [facility]   Report Status 12/29/2013 FINAL   Final  MRSA PCR SCREENING     Status: None   Collection Time    12/29/13  8:53 AM      Result Value Ref Range Status   MRSA by PCR NEGATIVE  NEGATIVE Final   Comment:            The GeneXpert MRSA Assay (FDA     approved  for NASAL specimens     only), is one component of a     comprehensive MRSA colonization     surveillance program. It is not     intended to diagnose MRSA     infection nor to guide or     monitor treatment for     MRSA infections.     Studies: [facility]  12/29/2013   CLINICAL DATA:  Acute renal failure, hematuria  EXAM: RENAL/URINARY TRACT ULTRASOUND COMPLETE  COMPARISON:  None.  FINDINGS: Right Kidney:  Length: 11.2 cm. Echogenicity within normal limits. No mass or hydronephrosis visualized. Shadowing hyperechoic 10 mm focus in the right kidney consistent with nephrolithiasis.  Left Kidney:  Length: 11.7 cm. Echogenicity within normal limits. No hydronephrosis visualized. Multiple anechoic left renal mass is with the largest measuring 3.9 x 3.3 x 3.8 cm most consistent with cysts.  Bladder:  Foley catheter within the bladder. There is a hypoechoic multi septated mass surrounding the Foley catheter likely representing a hematoma.  IMPRESSION: 1. Nonobstructing right nephrolithiasis. 2. Left renal cysts. 3. Hypo: Multi-septated mass around the Foley catheter within the bladder likely representing a hematoma.   Electronically Signed   By: Elige KoHetal  Patel   On: 12/29/2013 11:15   Dg Chest Port 1 View  12/29/2013   CLINICAL DATA:  Hoarse voice  EXAM: PORTABLE CHEST - 1 VIEW  COMPARISON:  08/07/2013  FINDINGS: Bilateral diffuse interstitial thickening likely chronic. There is no focal parenchymal opacity, pleural effusion, or pneumothorax. The heart and mediastinal contours are unremarkable.  Osteoarthritis of bilateral glenohumeral joints.  IMPRESSION: Diffuse bilateral interstitial thickening likely chronic. Superimposed mild interstitial edema or atypical infection cannot be excluded.   Electronically Signed   By: Elige KoHetal  Patel   On: 12/29/2013 14:22    Scheduled Meds: . sodium chloride   Intravenous Once  . antiseptic oral rinse  15 mL Mouth Rinse BID  . cefTRIAXone (ROCEPHIN)  IV  1 g Intravenous  Q24H  . insulin aspart  0-9 Units Subcutaneous TID WC  . insulin glargine  10 Units Subcutaneous QHS  . levothyroxine  50 mcg Oral QAC breakfast  . LORazepam  0.5 mg Oral QHS   Continuous Infusions: . sodium chloride 75 mL/hr at 12/29/13 1657  . sodium chloride irrigation      Active Problems:  Diabetes mellitus type 2 with complications   CAD (coronary artery disease), native coronary artery   Hypothyroidism (acquired)   Atrial fibrillation with controlled ventricular response   Sepsis    Time spent: 30 minutes.     [facility] LLCKULA,VIJAYA  [facility] Pager [phone]. If 7PM-7AM, please contact night-coverage at [email or web address], password [email or web address] 12/30/2013, 9:21 AM  LOS: 2 days

## 2013-12-30 NOTE — Progress Notes (Signed)
Clinical Social Work Department BRIEF PSYCHOSOCIAL ASSESSMENT 12/30/2013  Patient:  Carlos Morton, Carlos Morton     Account Number:  [id number]     Admit date:  12/28/2013  Clinical Social Worker:  Levie Heritage  Date/Time:  12/30/2013 03:53 PM  Referred by:  Physician  Date Referred:  12/30/2013 Referred for  ALF Placement   Other Referral:   Interview type:  Other - See comment Other interview type:   Pt's graddaughter at bedside    PSYCHOSOCIAL DATA Living Status:  FACILITY Admitted from facility:  [facility] Level of care:  Assisted Living Primary support name:  Arbie Cookey Primary support relationship to patient:  CHILD, ADULT Degree of support available:   strong    CURRENT CONCERNS Current Concerns  Post-Acute Placement   Other Concerns:    SOCIAL WORK ASSESSMENT / PLAN Per RN, Pt confused and not fully able to participate in assessment.    Met with Pt's granddaughter outside of Pt's room to discuss d/c plans.    Pt's granddaughter stated that she is in town from [place] and is staying with Pt until her mother returns from her trip to [place] tomorrow.    Pt's granddaughter confirmed that Pt is from [facility] and stated that he has lived ther for over a year. She stated that both Pt and the family are pleased with the facility and that they'd like for hime to return there upon d/c.    CSW thanked Pt's granddaughter for her time.   Assessment/plan status:  Psychosocial Support/Ongoing Assessment of Needs Other assessment/ plan:   Information/referral to community resources:   n/a--from facility    PATIENT'S/FAMILY'S RESPONSE TO PLAN OF CARE: Pt's granddaughter was calm, cooperative and pleasant.  She stated that Pt likes [facility] and that everyone will be happy when he's able to return.   Bernita Raisin, [place] Social Work [phone]

## 2013-12-31 LAB — GLUCOSE, CAPILLARY
GLUCOSE-CAPILLARY: 142 mg/dL — AB (ref 70–99)
Glucose-Capillary: 140 mg/dL — ABNORMAL HIGH (ref 70–99)
Glucose-Capillary: 161 mg/dL — ABNORMAL HIGH (ref 70–99)

## 2013-12-31 LAB — PROCALCITONIN: Procalcitonin: 4.56 ng/mL

## 2013-12-31 MED ORDER — LORAZEPAM 0.5 MG PO TABS
0.5000 mg | ORAL_TABLET | Freq: Once | ORAL | Status: AC
  Administered 2013-12-31: 0.5 mg via ORAL

## 2013-12-31 MED ORDER — FUROSEMIDE 20 MG PO TABS
20.0000 mg | ORAL_TABLET | Freq: Two times a day (BID) | ORAL | Status: DC
Start: 2013-12-31 — End: 2014-01-05
  Administered 2013-12-31 – 2014-01-05 (×9): 20 mg via ORAL
  Filled 2013-12-31 (×13): qty 1

## 2013-12-31 NOTE — Progress Notes (Signed)
[facility] PROGRESS NOTE  Orange Hilligoss ZOX:[id number] DOB: August 21, 1916 DOA: 12/28/2013 PCP: Karle Plumber, MD Interim summary: Carlos Morton is a 80 y.o. male with h/o hypertension, DM, afib, CHF, came in yesterday to ED, for acute urinary retention And hematuria yesterday, underwent foley cathter placement and was discharged back to the facility with an antibiotic. He presented later this morning for gross hematuria, . On arrival to ED, he was found to be febrile, hypotension, tachycardic. He was referred to medical service for admission . Currently patient denies any new complaints  Assessment/Plan: Sepsis secondary to UTI:  - he was  admitted to step down. Started patient on rocephin. Urine and blood cultures ordered and pending, hypotension secondary to sepsis. He has responded to fluids. He has received so far 6 liters of fluids and he has put our 5 liters of urine. As his congestion has worsened , we will stop the fluids and give lasix as blood pressure tolerates.     Fever, leukocytosis, hypotension:  - secondary to sepsis from UTI. Urine cultures grew 60,000 multiple bacterial morphotypes.  - continue with rocephin to complete the course.   Hematuria:  - possibly traumatic. foleyin place, underwent irrigation in ED.  - urology consult from Dr Brunilda Payor requested and recommendations given.  His urine has cleared up.   Anemia of Blood loss:  -possibly from the hematuria.  - will transfuse if H&h drops to less than 7.  - monitor hemoglobin  - holding aspirin and plavix.  - will get anemia panel.   Diabetes Mellitus:  - ssi, resume lantus.  - hgba1c is 7.5.  - CBG (last 3)   Recent Labs  12/30/13 2141 12/31/13 0745 12/31/13 1146  GLUCAP 140* 140* 142*     Acute renal failure:  - probably from sepsis and UTI, dehdyration.  - hydration and repeated renal parameters in am show improvement..  DVT prophylaxis.  scd's   DNR Grand daughter at bedside.  Remain in  step down.    Consultants:  pccm  Procedures:  CXR    Antibiotics:  Rocephin 6/26  HPI/Subjective: Confused, but knows he is in the hospital, reports chills today.   Objective: Filed Vitals:   12/31/13 1600  BP: 108/61  Pulse: 88  Temp:   Resp: 15    Intake/Output Summary (Last 24 hours) at 12/31/13 1646 Last data filed at 12/31/13 1600  Gross per 24 hour  Intake   5005 ml  Output    900 ml  Net   4105 ml   Filed Weights   12/29/13 0900 12/30/13 0400 12/31/13 0400  Weight: 72.7 kg (160 lb 4.4 oz) 76.6 kg (168 lb 14 oz) 77.4 kg (170 lb 10.2 oz)    Exam:   General: awake and, confused  Cardiovascular: s1s2  Respiratory: diminished air entry at bases.   Abdomen: soft NT ND BS+  Musculoskeletal: no pedal edema.   Data Reviewed: Basic Metabolic Panel:  Recent Labs Lab 12/28/13 0919 12/29/13 0109 12/30/13 1036  NA 140 133* 138  K 4.2 4.6 4.1  CL 100 96 109  CO2 24 21 20   GLUCOSE 216* 304* 142*  BUN 28* 45* 30*  CREATININE 0.99 1.93* 1.37*  CALCIUM 8.9 8.8 8.1*   Liver Function Tests:  Recent Labs Lab 12/28/13 0919 12/29/13 0109  AST 12 13  ALT 8 7  ALKPHOS 64 61  BILITOT 0.6 0.7  PROT 6.7 6.4  ALBUMIN 3.6 3.4*   No results found for this basename: LIPASE,  AMYLASE,  in the last 168 hours No results found for this basename: AMMONIA,  in the last 168 hours CBC:  Recent Labs Lab 12/28/13 0919 12/29/13 0109 12/29/13 1119 12/29/13 1820 12/30/13 0230 12/30/13 1036 12/30/13 1039  WBC 14.8* 23.9*  --   --   --   --  21.6*  NEUTROABS 11.4* 20.7*  --   --   --   --   --   HGB 11.7* 10.6* 8.4* 8.4* 8.1* 8.6* 8.6*  HCT 35.2* 31.3* 24.7* 25.2* 23.9* 25.4* 25.4*  MCV 86.5 85.5  --   --   --   --  87.9  PLT 193 190  --   --   --   --  116*   Cardiac Enzymes: No results found for this basename: CKTOTAL, CKMB, CKMBINDEX, TROPONINI,  in the last 168 hours BNP (last 3 results)  Recent Labs  06/21/13 1750 06/22/13 0330  PROBNP  2179.0* 2124.0*   CBG:  Recent Labs Lab 12/30/13 1204 12/30/13 1700 12/30/13 2141 12/31/13 0745 12/31/13 1146  GLUCAP 133* 133* 140* 140* 142*    Recent Results (from the past 240 hour(s))  URINE CULTURE     Status: None   Collection Time    12/28/13  6:36 AM      Result Value Ref Range Status   Specimen Description URINE, RANDOM   Final   Special Requests NONE   Final   Culture  Setup Time     Final   Value: 12/28/2013 12:43     Performed at [facility]   Colony Count     Final   Value: 60,000 COLONIES/ML     Performed at [facility]   Culture     Final   Value: Multiple bacterial morphotypes present, none predominant. Suggest appropriate recollection if clinically indicated.     Performed at [facility]   Report Status 12/29/2013 FINAL   Final  MRSA PCR SCREENING     Status: None   Collection Time    12/29/13  8:53 AM      Result Value Ref Range Status   MRSA by PCR NEGATIVE  NEGATIVE Final   Comment:            The GeneXpert MRSA Assay (FDA     approved for NASAL specimens     only), is one component of a     comprehensive MRSA colonization     surveillance program. It is not     intended to diagnose MRSA     infection nor to guide or     monitor treatment for     MRSA infections.  CULTURE, BLOOD (ROUTINE X 2)     Status: None   Collection Time    12/29/13  6:00 PM      Result Value Ref Range Status   Specimen Description BLOOD LEFT HAND   Final   Special Requests BOTTLES DRAWN AEROBIC AND ANAEROBIC 10CC   Final   Culture  Setup Time     Final   Value: 12/29/2013 22:56     Performed at [facility]   Culture     Final   Value:        BLOOD CULTURE RECEIVED NO GROWTH TO DATE CULTURE WILL BE HELD FOR 5 DAYS BEFORE ISSUING A FINAL NEGATIVE REPORT     Performed at [facility]   Report Status PENDING   Incomplete  CULTURE, BLOOD (ROUTINE X 2)  Status: None   Collection Time    12/29/13  6:20 PM      Result  Value Ref Range Status   Specimen Description BLOOD LEFT ARM   Final   Special Requests BOTTLES DRAWN AEROBIC AND ANAEROBIC 10CC   Final   Culture  Setup Time     Final   Value: 12/29/2013 22:57     Performed at [facility]   Culture     Final   Value:        BLOOD CULTURE RECEIVED NO GROWTH TO DATE CULTURE WILL BE HELD FOR 5 DAYS BEFORE ISSUING A FINAL NEGATIVE REPORT     Performed at [facility]   Report Status PENDING   Incomplete     Studies: Dg Chest Port 1 View  12/30/2013   CLINICAL DATA:  Cough.  Congestion.  Hypertension.  Diabetes.  EXAM: PORTABLE CHEST - 1 VIEW  COMPARISON:  12/29/2013  FINDINGS: Bilateral glenohumeral joint osteoarthritis. Patient rotated left. Cardiomegaly accentuated by AP portable technique. Probable small bilateral pleural effusions. No pneumothorax. Worsening interstitial prominence and indistinctness. Left greater than right patchy airspace disease is new or progressive.  IMPRESSION: Worsening congestive heart failure, moderate.  Probable small bilateral pleural effusions with adjacent developing atelectasis or infection.   Electronically Signed   By: Jeronimo GreavesKyle  Talbot M.D.   On: 12/30/2013 16:30    Scheduled Meds: . antiseptic oral rinse  15 mL Mouth Rinse BID  . cefTRIAXone (ROCEPHIN)  IV  1 g Intravenous Q24H  . furosemide  20 mg Oral BID  . insulin aspart  0-9 Units Subcutaneous TID WC  . insulin glargine  10 Units Subcutaneous QHS  . levothyroxine  50 mcg Oral QAC breakfast  . LORazepam  0.5 mg Oral QHS   Continuous Infusions: . sodium chloride irrigation      Active Problems:   Diabetes mellitus type 2 with complications   CAD (coronary artery disease), native coronary artery   Hypothyroidism (acquired)   Atrial fibrillation with controlled ventricular response   Sepsis    Time spent: 30 minutes.     [facility],VIJAYA  [facility] Pager [phone]. If 7PM-7AM, please contact night-coverage at [email or web address], password  [email or web address] 12/31/2013, 4:46 PM  LOS: 3 days

## 2014-01-01 ENCOUNTER — Inpatient Hospital Stay (HOSPITAL_COMMUNITY): Payer: Medicare Other

## 2014-01-01 LAB — CBC
HCT: 23.8 % — ABNORMAL LOW (ref 39.0–52.0)
Hemoglobin: 8.1 g/dL — ABNORMAL LOW (ref 13.0–17.0)
MCH: 29.5 pg (ref 26.0–34.0)
MCHC: 34 g/dL (ref 30.0–36.0)
MCV: 86.5 fL (ref 78.0–100.0)
Platelets: 124 10*3/uL — ABNORMAL LOW (ref 150–400)
RBC: 2.75 MIL/uL — ABNORMAL LOW (ref 4.22–5.81)
RDW: 14 % (ref 11.5–15.5)
WBC: 15.6 10*3/uL — ABNORMAL HIGH (ref 4.0–10.5)

## 2014-01-01 LAB — GLUCOSE, CAPILLARY
GLUCOSE-CAPILLARY: 147 mg/dL — AB (ref 70–99)
Glucose-Capillary: 95 mg/dL (ref 70–99)
Glucose-Capillary: 69 mg/dL — ABNORMAL LOW (ref 70–99)
Glucose-Capillary: 119 mg/dL — ABNORMAL HIGH (ref 70–99)
Glucose-Capillary: 89 mg/dL (ref 70–99)

## 2014-01-01 LAB — BASIC METABOLIC PANEL
BUN: 21 mg/dL (ref 6–23)
CO2: 22 mEq/L (ref 19–32)
Calcium: 8 mg/dL — ABNORMAL LOW (ref 8.4–10.5)
Chloride: 105 mEq/L (ref 96–112)
Creatinine, Ser: 1 mg/dL (ref 0.50–1.35)
GFR calc Af Amer: 72 mL/min — ABNORMAL LOW (ref >90)
GFR, EST NON AFRICAN AMERICAN: 62 mL/min — AB (ref >90)
Glucose, Bld: 73 mg/dL (ref 70–99)
Potassium: 3.3 mEq/L — ABNORMAL LOW (ref 3.7–5.3)
Sodium: 139 mEq/L (ref 137–147)

## 2014-01-01 LAB — PROCALCITONIN: Procalcitonin: 4.17 ng/mL

## 2014-01-01 MED ORDER — ALPRAZOLAM 0.5 MG PO TABS
0.5000 mg | ORAL_TABLET | Freq: Once | ORAL | Status: AC
  Administered 2014-01-01: 0.5 mg via ORAL
  Filled 2014-01-01: qty 1

## 2014-01-01 MED ORDER — FINASTERIDE 5 MG PO TABS
5.0000 mg | ORAL_TABLET | Freq: Every day | ORAL | Status: DC
  Administered 2014-01-01 – 2014-01-05 (×5): 5 mg via ORAL
  Filled 2014-01-01 (×5): qty 1

## 2014-01-01 MED ORDER — POTASSIUM CHLORIDE CRYS ER 20 MEQ PO TBCR
40.0000 meq | EXTENDED_RELEASE_TABLET | Freq: Two times a day (BID) | ORAL | Status: AC
  Administered 2014-01-01 (×2): 40 meq via ORAL
  Filled 2014-01-01 (×2): qty 2

## 2014-01-01 MED ORDER — ACETAMINOPHEN 325 MG PO TABS
650.0000 mg | ORAL_TABLET | Freq: Four times a day (QID) | ORAL | Status: DC | PRN
  Administered 2014-01-01 – 2014-01-03 (×2): 650 mg via ORAL
  Filled 2014-01-01 (×2): qty 2

## 2014-01-01 NOTE — Clinical Documentation Improvement (Signed)
01/01/14 Noted 12/31/13 Prog Note.Marland Kitchen.Marland Kitchen."Anemia of Blood loss:  -possibly from the hematuria.  - will transfuse if H&h drops to less than 7.  - monitor hemoglobin  - holding aspirin and plavix.  - will get anemia panel.".Marland Kitchen.Marland Kitchen.  For accurate Dx specificity & severity can noted "Anemia of Blood loss" be further specified with type. Thank you  Possible Clinical Conditions?   Expected Acute Blood Loss Anemia  Acute Blood Loss Anemia  Acute on chronic blood loss anemia  Chronic blood loss anemia  Precipitous drop in Hematocrit  Other Condition  Cannot Clinically Determine  Supporting Information: Risk Factors: 12/29/13 H&P..."presented later this morning for gross hematuria, . On arrival to ED, he was found to be febrile, hypotension, tachycardic." Signs and Symptoms: See above note Diagnostics: 12/29/13  01/01/14 Hgb 11.7/ Hct 35.2 Hgb 8.1/ Hct 23.8 Treatments: See above note   Thank You, Toribio Harbourphelia R Moore, RN, BSN, CCDS Certified Clinical Documentation Specialist Pager: [phone]  [facility]- Health Information Management

## 2014-01-01 NOTE — Progress Notes (Signed)
Urology Progress Note  Subjective:     No acute urologic events overnight. Urine is clear.  ROS: Positive: SOB Negative: Nausea  Objective:  Patient Vitals for the past 24 hrs:  BP Temp Temp src Pulse Resp SpO2 Height Weight  01/01/14 1256 127/79 mmHg 97.8 F (36.6 C) Oral 97 20 97 % - -  01/01/14 1219 123/63 mmHg 97.5 F (36.4 C) Oral 82 22 100 % 5\' 8"  (1.727 m) 80.5 kg (177 lb 7.5 oz)  01/01/14 1212 123/63 mmHg 97.5 F (36.4 C) Oral 82 22 100 % 5\' 8"  (1.727 m) 80.6 kg (177 lb 11.1 oz)  01/01/14 1030 - - - - - 95 % - -  01/01/14 1000 125/97 mmHg - - 82 15 95 % - -  01/01/14 0900 138/54 mmHg - - 84 23 96 % - -  01/01/14 0800 116/61 mmHg 97.5 F (36.4 C) Oral 67 30 96 % - -  01/01/14 0700 111/52 mmHg - - 63 11 94 % - -  01/01/14 0600 105/42 mmHg - - 72 35 99 % - -  01/01/14 0500 115/46 mmHg - - 84 0 100 % - -  01/01/14 0400 104/49 mmHg 98.2 F (36.8 C) Oral 76 15 93 % - 78.1 kg (172 lb 2.9 oz)  01/01/14 0300 106/33 mmHg - - 80 20 96 % - -  01/01/14 0200 129/92 mmHg - - 95 17 97 % - -  01/01/14 0100 142/58 mmHg - - 106 21 100 % - -  01/01/14 0000 - 98 F (36.7 C) Oral 69 18 100 % - -  12/31/13 2300 118/53 mmHg - - 80 25 100 % - -  12/31/13 2200 92/49 mmHg - - 87 17 100 % - -  12/31/13 2100 125/45 mmHg - - 33 21 99 % - -  12/31/13 2000 109/60 mmHg 98.1 F (36.7 C) Oral 67 16 100 % - -  12/31/13 1900 115/56 mmHg - - 42 19 100 % - -  12/31/13 1800 93/49 mmHg - - 98 19 97 % - -    Physical Exam: General:  No acute distress, awake Cardiovascular:    [x]   S1/S2 present, RRR  []   Irregularly irregular  Abdomen:               []  Soft, appropriately TTP  [x]  Soft, NTTP  []  Soft, appropriately TTP, incision(s) clean/dry/intact  Genitourinary: Uncircumcised. Negative paraphimosis. Negative edema. Foley:  In proper position. Draining clear yellow urine.    I/O last 3 completed shifts: In: 1870 [P.O.:600; I.V.:1170; IV Piggyback:100] Out: 2025 [Urine:2025]  Recent Labs      12/30/13  1039  01/01/14  0410  HGB  8.6*  8.1*  WBC  21.6*  15.6*  PLT  116*  124*    Recent Labs     12/30/13  1036  01/01/14  0410  NA  138  139  K  4.1  3.3*  CL  109  105  CO2  20  22  BUN  30*  21  CREATININE  1.37*  1.00  CALCIUM  8.1*  8.0*  GFRNONAA  42*  62*  GFRAA  49*  72*     No results found for this basename: PT, INR, APTT,  in the last 72 hours   No components found with this basename: ABG,     Length of stay: 4 days.  Assessment: Gross hematuria. Recurrent. Urinary retention.    Plan: May be beneficial to  start finasteride as this can decrease bleeding from the prostate with few side effects. Will order.  Continue current care. Unfortunately, the patient will continue to need the catheter and this will periodically result in a urinary tract infection.   Continue with foley catheter with exchange monthly at his nursing facility every 4 weeks.   Keep outpatient follow up at [facility] with my partner, Dr. Retta Dionesahlstedt as scheduled.   Natalia Leatherwoodaniel Nylah Butkus, MD [phone]

## 2014-01-01 NOTE — Progress Notes (Addendum)
[facility] PROGRESS NOTE  Carlos Morton ZOX:[id number] DOB: [birth year] DOA: 12/28/2013 PCP: Karle Plumber, MD Interim summary: Carlos Morton is a 80 y.o. male with h/o hypertension, DM, afib, CHF, came in yesterday to ED, for acute urinary retention And hematuria yesterday, underwent foley cathter placement and was discharged back to the facility with an antibiotic. He presented later this morning for gross hematuria, . On arrival to ED, he was found to be febrile, hypotension, tachycardic. He was referred to medical service for admission . Currently patient denies any new complaints. He has responded to fluids and his hematuria resolved. He was transferred to regular bed on 6/29.  Assessment/Plan: Sepsis secondary to UTI:  - he was  admitted to step down. Started patient on rocephin. Urine and blood cultures ordered. Urine cultures grew 60,000 multiple bacterial morphotypes. hypotension secondary to sepsis. He has responded to fluids. He has received so far 6 liters of fluids and he has put our 5 liters of urine. As his congestion has worsened , we will stop the fluids and give lasix as blood pressure tolerates. Repeat CXR ordered.     Fever, leukocytosis, hypotension:  - secondary to sepsis from UTI. Urine cultures grew 60,000 multiple bacterial morphotypes.  - continue with rocephin to complete the course.  Leukocytosis improved.   Hematuria:  - possibly traumatic. foleyin place, underwent irrigation in ED.  - urology consult from Dr Brunilda Payor requested and recommendations given.  His urine has cleared up.   Acute Anemia of Blood loss:  -possibly from the hematuria.  - will transfuse if H&h drops to less than 7.  - monitor hemoglobin  - holding aspirin and plavix.  - will get anemia panel.   Diabetes Mellitus:  - ssi, resume lantus.  - hgba1c is 7.5.  - CBG (last 3)   Recent Labs  12/31/13 1638 12/31/13 2309 01/01/14 1215  GLUCAP 161* 95 147*     Acute renal failure:   - probably from sepsis and UTI, dehdyration.  - hydration and repeated renal parameters in am show improvement..   Hypokalemia:  - replete as needed.   Mild acute CHF exacerbation; possibly from the fluids he was given. He was started on lasix, and a repeat CXR ordered.    Coughing while eating today: - speech and swallow eval ordered , recommended NPO and esophagogram in am. Continue to monitor.   DVT prophylaxis.  scd's   DNR None at bedside.  Transferred to regular bed.    Consultants:  pccm  Procedures:  CXR   Antibiotics:  Rocephin 6/26  HPI/Subjective: Confused, with some hallucinations.   Objective: Filed Vitals:   01/01/14 1256  BP: 127/79  Pulse: 97  Temp: 97.8 F (36.6 C)  Resp: 20    Intake/Output Summary (Last 24 hours) at 01/01/14 1351 Last data filed at 01/01/14 1000  Gross per 24 hour  Intake    130 ml  Output   1750 ml  Net  -1620 ml   Filed Weights   01/01/14 0400 01/01/14 1212 01/01/14 1219  Weight: 78.1 kg (172 lb 2.9 oz) 80.6 kg (177 lb 11.1 oz) 80.5 kg (177 lb 7.5 oz)    Exam:   General: awake and, confused  Cardiovascular: s1s2  Respiratory: diminished air entry at bases. Scattered rales.   Abdomen: soft NT ND BS+  Musculoskeletal: no pedal edema.   Data Reviewed: Basic Metabolic Panel:  Recent Labs Lab 12/28/13 0919 12/29/13 0109 12/30/13 1036 01/01/14 0410  NA 140 133* 138  139  K 4.2 4.6 4.1 3.3*  CL 100 96 109 105  CO2 24 21 20 22   GLUCOSE 216* 304* 142* 73  BUN 28* 45* 30* 21  CREATININE 0.99 1.93* 1.37* 1.00  CALCIUM 8.9 8.8 8.1* 8.0*   Liver Function Tests:  Recent Labs Lab 12/28/13 0919 12/29/13 0109  AST 12 13  ALT 8 7  ALKPHOS 64 61  BILITOT 0.6 0.7  PROT 6.7 6.4  ALBUMIN 3.6 3.4*   No results found for this basename: LIPASE, AMYLASE,  in the last 168 hours No results found for this basename: AMMONIA,  in the last 168 hours CBC:  Recent Labs Lab 12/28/13 0919 12/29/13 0109   12/29/13 1820 12/30/13 0230 12/30/13 1036 12/30/13 1039 01/01/14 0410  WBC 14.8* 23.9*  --   --   --   --  21.6* 15.6*  NEUTROABS 11.4* 20.7*  --   --   --   --   --   --   HGB 11.7* 10.6*  < > 8.4* 8.1* 8.6* 8.6* 8.1*  HCT 35.2* 31.3*  < > 25.2* 23.9* 25.4* 25.4* 23.8*  MCV 86.5 85.5  --   --   --   --  87.9 86.5  PLT 193 190  --   --   --   --  116* 124*  < > = values in this interval not displayed. Cardiac Enzymes: No results found for this basename: CKTOTAL, CKMB, CKMBINDEX, TROPONINI,  in the last 168 hours BNP (last 3 results)  Recent Labs  06/21/13 1750 06/22/13 0330  PROBNP 2179.0* 2124.0*   CBG:  Recent Labs Lab 12/31/13 0745 12/31/13 1146 12/31/13 1638 12/31/13 2309 01/01/14 1215  GLUCAP 140* 142* 161* 95 147*    Recent Results (from the past 240 hour(s))  URINE CULTURE     Status: None   Collection Time    12/28/13  6:36 AM      Result Value Ref Range Status   Specimen Description URINE, RANDOM   Final   Special Requests NONE   Final   Culture  Setup Time     Final   Value: 12/28/2013 12:43     Performed at [facility]   Colony Count     Final   Value: 60,000 COLONIES/ML     Performed at [facility]   Culture     Final   Value: Multiple bacterial morphotypes present, none predominant. Suggest appropriate recollection if clinically indicated.     Performed at [facility]   Report Status 12/29/2013 FINAL   Final  MRSA PCR SCREENING     Status: None   Collection Time    12/29/13  8:53 AM      Result Value Ref Range Status   MRSA by PCR NEGATIVE  NEGATIVE Final   Comment:            The GeneXpert MRSA Assay (FDA     approved for NASAL specimens     only), is one component of a     comprehensive MRSA colonization     surveillance program. It is not     intended to diagnose MRSA     infection nor to guide or     monitor treatment for     MRSA infections.  CULTURE, BLOOD (ROUTINE X 2)     Status: None   Collection Time     12/29/13  6:00 PM      Result Value Ref Range  Status   Specimen Description BLOOD LEFT HAND   Final   Special Requests BOTTLES DRAWN AEROBIC AND ANAEROBIC 10CC   Final   Culture  Setup Time     Final   Value: 12/29/2013 22:56     Performed at [facility]   Culture     Final   Value:        BLOOD CULTURE RECEIVED NO GROWTH TO DATE CULTURE WILL BE HELD FOR 5 DAYS BEFORE ISSUING A FINAL NEGATIVE REPORT     Performed at [facility]   Report Status PENDING   Incomplete  CULTURE, BLOOD (ROUTINE X 2)     Status: None   Collection Time    12/29/13  6:20 PM      Result Value Ref Range Status   Specimen Description BLOOD LEFT ARM   Final   Special Requests BOTTLES DRAWN AEROBIC AND ANAEROBIC 10CC   Final   Culture  Setup Time     Final   Value: 12/29/2013 22:57     Performed at [facility]   Culture     Final   Value:        BLOOD CULTURE RECEIVED NO GROWTH TO DATE CULTURE WILL BE HELD FOR 5 DAYS BEFORE ISSUING A FINAL NEGATIVE REPORT     Performed at [facility]   Report Status PENDING   Incomplete     Studies: Dg Chest Port 1 View  12/30/2013   CLINICAL DATA:  Cough.  Congestion.  Hypertension.  Diabetes.  EXAM: PORTABLE CHEST - 1 VIEW  COMPARISON:  12/29/2013  FINDINGS: Bilateral glenohumeral joint osteoarthritis. Patient rotated left. Cardiomegaly accentuated by AP portable technique. Probable small bilateral pleural effusions. No pneumothorax. Worsening interstitial prominence and indistinctness. Left greater than right patchy airspace disease is new or progressive.  IMPRESSION: Worsening congestive heart failure, moderate.  Probable small bilateral pleural effusions with adjacent developing atelectasis or infection.   Electronically Signed   By: Jeronimo GreavesKyle  Talbot M.D.   On: 12/30/2013 16:30    Scheduled Meds: . antiseptic oral rinse  15 mL Mouth Rinse BID  . cefTRIAXone (ROCEPHIN)  IV  1 g Intravenous Q24H  . furosemide  20 mg Oral BID  . insulin  aspart  0-9 Units Subcutaneous TID WC  . insulin glargine  10 Units Subcutaneous QHS  . levothyroxine  50 mcg Oral QAC breakfast  . LORazepam  0.5 mg Oral QHS  . potassium chloride  40 mEq Oral BID   Continuous Infusions: . sodium chloride irrigation      Active Problems:   Diabetes mellitus type 2 with complications   CAD (coronary artery disease), native coronary artery   Hypothyroidism (acquired)   Atrial fibrillation with controlled ventricular response   Sepsis    Time spent: 30 minutes.     [facility] LLCKULA,VIJAYA  [facility] Pager [phone]. If 7PM-7AM, please contact night-coverage at [email or web address], password [email or web address] 01/01/2014, 1:51 PM  LOS: 4 days

## 2014-01-01 NOTE — Evaluation (Signed)
Clinical/Bedside Swallow Evaluation Patient Details  Name: Carlos Morton MRN: [id number] Date of Birth: [birth year]  Today's Date: 01/01/2014 Time: [id number] SLP Time Calculation (min): 42 min  Past Medical History:  Past Medical History  Diagnosis Date  . Hypertension   . Diabetes mellitus   . Thyroid disease   . High cholesterol   . Atrial fibrillation   . CHF (congestive heart failure)   . Emphysema    Past Surgical History:  Past Surgical History  Procedure Laterality Date  . Carotid stent      on blood thinner for stent  . Replacement total knee      left knee  . Appendectomy     HPI:  [facility] with sepsis. PMH + for CHF, hematuria.  Pt reports recent difficulty swallowing, lack of appetite, breathing difficulties and "laryngitis".  CXR 6/27 showed worsening CHF, moderate bilateral pleural effusion ? ATX or infection.  Swallow evaluation ordered.     Assessment / Plan / Recommendation Clinical Impression  Pt presents with concerns for oropharyngeal dysphagia due to respiratory status/dyspnea with appearance of periods of apnea.  Intake appears laborious and tiresome for this pt and worsens his baseline dyspnea.  Overt cough after swallow of cracker noted concerning for aspiration.  Frequent belching noted after swallow of liquids with appearance of regurgitation/retching noted  - pt admitted to sensation of reflux/backlfow with delayed coughing.     Pt admits that he does not have an appetite and he gets full easily but reports he does enjoy eating.    RN reports severe choking episode with lunch with poor pt awareness - pt stating "I'm alright".    SLP concerned for primary esophageal dysphagia and would recommend keep pt npo and consider having dedicated esophageal work up (? barium swallow) to aid in dysphagia management.  Pt admits to coughing worse AFTER eating.    Suspect pt will be chronic aspiration risk due to respiratory status, ? esoph issues, CHF and  age.  Pt educated and agrees that dysphagia has worsened in the last few months and breathing within the last few weeks.  He agrees to further work up if MD orders.    SLP will follow up for education to mitigate dysphagia/aspiration risk.     Aspiration Risk  Severe    Diet Recommendation NPO except meds;Ice chips PRN after oral care   Medication Administration: Whole meds with puree    Other  Recommendations   consider dedicated esophageal work up, ? Barium swallow  Follow Up Recommendations       Frequency and Duration min 1 x/week  1 week   Pertinent Vitals/Pain Afebrile, decreased     Swallow Study Prior Functional Status   see HHX    General Date of Onset: 01/01/14 HPI: [facility] with sepsis. PMH + for CHF, hematuria.  Pt reports recent difficulty swallowing, lack of appetite, breathing difficulties and "laryngitis".  CXR 6/27 showed worsening CHF, moderate bilateral pleural effusion ? ATX or infection.  Swallow evaluation ordered.   Type of Study: Bedside swallow evaluation Diet Prior to this Study: Regular;Thin liquids Temperature Spikes Noted: No Respiratory Status: Nasal cannula History of Recent Intubation: No Behavior/Cognition: Alert;Cooperative;Pleasant mood Oral Cavity - Dentition: Adequate natural dentition Self-Feeding Abilities: Needs set up Patient Positioning: Upright in bed Baseline Vocal Quality: Hoarse;Breathy;Aphonic (pt speaks in a whisper) Volitional Cough: Weak Volitional Swallow: Able to elicit    Oral/Motor/Sensory Function Overall Oral Motor/Sensory Function:  (generalized gross  weakness, no focal cn deficits)   Ice Chips Ice chips: Within functional limits Presentation: Spoon   Thin Liquid Thin Liquid: Impaired Presentation: Cup;Spoon;Self Fed Oral Phase Impairments: Impaired anterior to posterior transit;Reduced lingual movement/coordination Oral Phase Functional Implications: Prolonged oral transit Pharyngeal  Phase  Impairments: Suspected delayed Swallow;Multiple swallows;Other (comments) Other Comments: pt appeared to attempt to retch to regurgitate aftera conducting multiple swallows    Nectar Thick Nectar Thick Liquid: Not tested   Honey Thick Honey Thick Liquid: Not tested   Puree Presentation: Spoon Pharyngeal Phase Impairments: Multiple swallows   Solid   GO    Solid: Impaired Oral Phase Impairments: Reduced lingual movement/coordination;Impaired anterior to posterior transit Oral Phase Functional Implications: Other (comment) Pharyngeal Phase Impairments: Cough - Delayed;Multiple swallows       Mickie BailKimball, Tamara Ann Tamara Fuller AcresKimball, MS [facility] SLP [phone]

## 2014-01-01 NOTE — Progress Notes (Addendum)
CSW continuing to follow for pt disposition planning.  [facility] and pt and pt family wish for pt to return upon discharge.  CSW reviewed chart and noted that PT recommending Home Health PT at ALF.  CSW updated pt FL2 and [facility] Crystal. Awaiting return phone call.  CSW to continue to follow and assist with pt discharge needs when pt medically ready for discharge.  Addendum 11:51 am:  CSW received return phone call from [employer]rystal [facility]. Crystal confirmed that pt can return to facility once medically stable. CSW discussed with Crystal that PT recommending Home health and per Crystal pt [facility] and had a Engineer, civil (consulting)nurse. CSW to notify RNCM that pt active will [facility] and will need resumption of care orders including PT.   CSW attempted to meet with pt at bedside, but pt in process of transferring to unit at this time.  CSW to continue to follow and assist with pt disposition needs when pt medically ready for discharge.   Loletta SpecterSuzanna Kidd, MSW, LCSW Clinical Social Work [phone]

## 2014-01-01 NOTE — Evaluation (Signed)
Physical Therapy Evaluation Patient Details Name: Carlos Morton MRN: [id number] DOB: 1917/05/13 Today's Date: 01/01/2014   History of Present Illness  Carlos Morton is a 80 y.o. male with h/o hypertension, DM, afib, CHF, came in yesterday to ED, for acute urinary retention  And hematuria yesterday, underwent foley cathter placement and was discharged back to the facility with an antibiotic. He presented later this morning for gross hematuria, . On arrival to ED, he was found to be febrile, hypotension, tachycardic.  Clinical Impression  *Pt admitted with sepsis, UTI**. Pt currently with functional limitations due to the deficits listed below (see PT Problem List).  Pt will benefit from skilled PT to increase their independence and safety with mobility to allow discharge to the venue listed below.   **    Follow Up Recommendations Home health PT;Supervision for mobility/OOB (ALF)    Equipment Recommendations  None recommended by PT    Recommendations for Other Services       Precautions / Restrictions Precautions Precautions: Fall Restrictions Weight Bearing Restrictions: No      Mobility  Bed Mobility Overal bed mobility: Needs Assistance Bed Mobility: Supine to Sit     Supine to sit: Min assist     General bed mobility comments: assist to raise trunk  Transfers Overall transfer level: Needs assistance Equipment used: Rolling walker (2 wheeled) Transfers: Sit to/from [employer]Stand;Stand Pivot Transfers Sit to Stand: Min assist Stand pivot transfers: Min assist       General transfer comment: assist to rise, control descent  Ambulation/Gait Ambulation/Gait assistance: Min guard Ambulation Distance (Feet): 20 Feet Assistive device: Rolling walker (2 wheeled) Gait Pattern/deviations: Trunk flexed;Decreased stride length   Gait velocity interpretation: Below normal speed for age/gender General Gait Details: fatigued quickly, min/guard for safety  Stairs             Wheelchair Mobility    Modified Rankin (Stroke Patients Only)       Balance Overall balance assessment: Needs assistance   Sitting balance-Leahy Scale: Good     Standing balance support: Bilateral upper extremity supported Standing balance-Leahy Scale: Poor                               Pertinent Vitals/Pain *0/10 pain VSS**    Home Living Family/patient expects to be discharged to:: Assisted living               Home Equipment: Walker - 4 wheels;Grab bars - tub/shower      Prior Function Level of Independence: Needs assistance   Gait / Transfers Assistance Needed: uses rollator independently, no falls  ADL's / Homemaking Assistance Needed: assist for bathing/dressing        Hand Dominance        Extremity/Trunk Assessment   Upper Extremity Assessment: Overall WFL for tasks assessed           Lower Extremity Assessment: Overall WFL for tasks assessed      Cervical / Trunk Assessment: Kyphotic  Communication   Communication: No difficulties  Cognition Arousal/Alertness: Awake/alert Behavior During Therapy: WFL for tasks assessed/performed Overall Cognitive Status: Within Functional Limits for tasks assessed                      General Comments      Exercises        Assessment/Plan    PT Assessment Patient needs continued PT services  PT Diagnosis Generalized weakness  PT Problem List Decreased activity tolerance;Decreased mobility;Decreased balance  PT Treatment Interventions Gait training;Functional mobility training;Therapeutic activities;Therapeutic exercise;Balance training;Patient/family education   PT Goals (Current goals can be found in the Care Plan section) Acute Rehab PT Goals Patient Stated Goal: walk to dining room PT Goal Formulation: With patient Time For Goal Achievement: 01/15/14 Potential to Achieve Goals: Fair    Frequency Min 3X/week   Barriers to discharge        Co-evaluation                End of Session Equipment Utilized During Treatment: Gait belt Activity Tolerance: Patient limited by fatigue Patient left: in bed;with call bell/phone within reach Nurse Communication: Mobility status         Time: 0959-1020 PT Time Calculation (min): 21 min   Charges:   PT Evaluation $Initial PT Evaluation Tier I: 1 Procedure PT Treatments $Therapeutic Activity: 8-22 mins   PT G Codes:          Tamala SerUhlenberg, Jennifer Kistler 01/01/2014, 11:14 AM [phone]

## 2014-01-02 ENCOUNTER — Inpatient Hospital Stay (HOSPITAL_COMMUNITY): Payer: Medicare Other

## 2014-01-02 LAB — GLUCOSE, CAPILLARY
GLUCOSE-CAPILLARY: 144 mg/dL — AB (ref 70–99)
GLUCOSE-CAPILLARY: 93 mg/dL (ref 70–99)
GLUCOSE-CAPILLARY: 109 mg/dL — AB (ref 70–99)
Glucose-Capillary: 57 mg/dL — ABNORMAL LOW (ref 70–99)
Glucose-Capillary: 118 mg/dL — ABNORMAL HIGH (ref 70–99)

## 2014-01-02 LAB — BASIC METABOLIC PANEL
BUN: 18 mg/dL (ref 6–23)
CALCIUM: 8.5 mg/dL (ref 8.4–10.5)
CO2: 24 meq/L (ref 19–32)
CREATININE: 1.05 mg/dL (ref 0.50–1.35)
Chloride: 103 mEq/L (ref 96–112)
GFR calc Af Amer: 68 mL/min — ABNORMAL LOW (ref >90)
GFR calc non Af Amer: 59 mL/min — ABNORMAL LOW (ref >90)
Glucose, Bld: 54 mg/dL — ABNORMAL LOW (ref 70–99)
Potassium: 3.5 mEq/L — ABNORMAL LOW (ref 3.7–5.3)
SODIUM: 140 meq/L (ref 137–147)

## 2014-01-02 LAB — CBC
HCT: 25.2 % — ABNORMAL LOW (ref 39.0–52.0)
Hemoglobin: 8.6 g/dL — ABNORMAL LOW (ref 13.0–17.0)
MCH: 29.4 pg (ref 26.0–34.0)
MCHC: 34.1 g/dL (ref 30.0–36.0)
MCV: 86 fL (ref 78.0–100.0)
PLATELETS: 195 10*3/uL (ref 150–400)
RBC: 2.93 MIL/uL — ABNORMAL LOW (ref 4.22–5.81)
RDW: 13.9 % (ref 11.5–15.5)
WBC: 19.4 10*3/uL — AB (ref 4.0–10.5)

## 2014-01-02 LAB — MAGNESIUM: MAGNESIUM: 1.7 mg/dL (ref 1.5–2.5)

## 2014-01-02 LAB — FOLATE: Folate: 14 ng/mL

## 2014-01-02 LAB — IRON AND TIBC
Iron: <10 ug/dL — ABNORMAL LOW (ref 42–135)
UIBC: 169 ug/dL (ref 125–400)

## 2014-01-02 LAB — PROCALCITONIN: PROCALCITONIN: 2.36 ng/mL

## 2014-01-02 LAB — RETICULOCYTES
RBC.: 2.93 MIL/uL — AB (ref 4.22–5.81)
RETIC COUNT ABSOLUTE: 49.8 10*3/uL (ref 19.0–186.0)
RETIC CT PCT: 1.7 % (ref 0.4–3.1)

## 2014-01-02 LAB — FERRITIN: Ferritin: 220 ng/mL (ref 22–322)

## 2014-01-02 LAB — VITAMIN B12: VITAMIN B 12: 336 pg/mL (ref 211–911)

## 2014-01-02 MED ORDER — LORAZEPAM 1 MG PO TABS
1.0000 mg | ORAL_TABLET | Freq: Three times a day (TID) | ORAL | Status: DC | PRN
  Administered 2014-01-03: 1 mg via ORAL
  Filled 2014-01-02 (×2): qty 1

## 2014-01-02 MED ORDER — DEXTROSE 50 % IV SOLN
INTRAVENOUS | Status: AC
  Administered 2014-01-02: 50 mL
  Filled 2014-01-02: qty 50

## 2014-01-02 MED ORDER — POTASSIUM CHLORIDE CRYS ER 20 MEQ PO TBCR
40.0000 meq | EXTENDED_RELEASE_TABLET | Freq: Once | ORAL | Status: AC
  Administered 2014-01-02: 40 meq via ORAL
  Filled 2014-01-02: qty 2

## 2014-01-02 MED ORDER — VITAMINS A & D EX OINT
TOPICAL_OINTMENT | CUTANEOUS | Status: AC
  Administered 2014-01-02: 16:00:00
  Filled 2014-01-02: qty 5

## 2014-01-02 MED ORDER — DEXTROSE-NACL 5-0.9 % IV SOLN
INTRAVENOUS | Status: DC
  Administered 2014-01-02: 50 mL/h via INTRAVENOUS
  Administered 2014-01-04: 03:00:00 via INTRAVENOUS

## 2014-01-02 MED ORDER — FUROSEMIDE 10 MG/ML IJ SOLN
20.0000 mg | Freq: Once | INTRAMUSCULAR | Status: AC
  Administered 2014-01-02: 20 mg via INTRAVENOUS
  Filled 2014-01-02: qty 2

## 2014-01-02 MED ORDER — PIPERACILLIN-TAZOBACTAM 3.375 G IVPB
3.3750 g | Freq: Three times a day (TID) | INTRAVENOUS | Status: DC
  Administered 2014-01-02 – 2014-01-05 (×8): 3.375 g via INTRAVENOUS
  Filled 2014-01-02 (×9): qty 50

## 2014-01-02 NOTE — Progress Notes (Signed)
Having cheyne stokes resp, periods of apnea lasting approx 45sec. MD notified and will come and see. Ordered telementry and abg's

## 2014-01-02 NOTE — Progress Notes (Signed)
Clinical Social Work  CSW met with patient, dtr, and dtr's friend at bedside. Dtr reports she has been out of town but recently returned. Dtr reports she is concerned about patient returning to ALF and might need ST SNF. Patient has been to [facility] in the past and would like him to return there if SNF is needed. CSW explained process and Medicare coverage at SNF. Dtr agreeable to expand search if [facility] is unable to accept. CSW completed FL2 and faxed out and left a message for admissions coordinator. CSW will continue to follow.  [place], Petronila [phone]

## 2014-01-02 NOTE — Progress Notes (Signed)
Inpatient Diabetes Program Recommendations  AACE/ADA: New Consensus Statement on Inpatient Glycemic Control (2013)  Target Ranges:  Prepandial:   less than 140 mg/dL      Peak postprandial:   less than 180 mg/dL (1-2 hours)      Critically ill patients:  140 - 180 mg/dL   Reason for Visit: Hypoglycemia  Results for [name], Carlos (MRN [id number]) as of 01/02/2014 09:50  Ref. Range 01/02/2014 06:00  Sodium Latest Range: 137-147 mEq/L 140  Potassium Latest Range: 3.7-5.3 mEq/L 3.5 (L)  Chloride Latest Range: 96-112 mEq/L 103  CO2 Latest Range: 19-32 mEq/L 24  BUN Latest Range: 6-23 mg/dL 18  Creatinine Latest Range: 0.50-1.35 mg/dL 4.091.05  Calcium Latest Range: 8.4-10.5 mg/dL 8.5  GFR calc non Af Amer Latest Range: >90 mL/min 59 (L)  GFR calc Af Amer Latest Range: >90 mL/min 68 (L)  Glucose Latest Range: 70-99 mg/dL 54 (L)  Results for [name], Carlos Morton (MRN [id number]) as of 01/02/2014 09:50  Ref. Range 01/01/2014 16:12 01/01/2014 21:20 01/02/2014 08:03 01/02/2014 08:37  Glucose-Capillary Latest Range: 70-99 mg/dL 956119 (H) 89 57 (L) 213144 (H)   Hypoglycemia this am.  Consider decreasing Lantus dose to 6 units QHS.  Will continue to follow. Thank you. Ailene Ardshonda Angeliz Settlemyre, RD, LDN, CDE Inpatient Diabetes Coordinator [phone]

## 2014-01-02 NOTE — Progress Notes (Signed)
[facility] PROGRESS NOTE  Carlos LevyClayton Glomb WUJ:[id number] DOB: 07-21-1916 DOA: 12/28/2013 PCP: Karle PlumberARVIND,MOOGALI M, MD Interim summary: Carlos Morton is a 80 y.o. male with h/o hypertension, DM, afib, CHF, came in yesterday to ED, for acute urinary retention And hematuria yesterday, underwent foley cathter placement and was discharged back to the facility with an antibiotic. He presented later this morning for gross hematuria, . On arrival to ED, he was found to be febrile, hypotension, tachycardic. He was referred to medical service for admission . Currently patient denies any new complaints. He has responded to fluids and his hematuria resolved. He was transferred to regular bed on 6/29.  Assessment/Plan: Sepsis secondary to UTI:  - he was  admitted to step down. Started patient on rocephin. Urine and blood cultures ordered. Urine cultures grew 60,000 multiple bacterial morphotypes. hypotension secondary to sepsis. He has responded to fluids. He has received so far 6 liters of fluids and he has put our 5 liters of urine. As his congestion has worsened , we will stop the fluids and give lasix as blood pressure tolerates. Repeat CXR ordered.     Fever, leukocytosis, hypotension:  - secondary to sepsis from UTI. Urine cultures grew 60,000 multiple bacterial morphotypes.  -he was started on rocephin , later on changed to zosyn to cover aspiration pneumonia.  Leukocytosis worsening.   Hematuria:  - possibly traumatic. foleyin place, underwent irrigation in ED.  - urology consult from Dr Brunilda PayorNesi requested and recommendations given.  His urine has cleared up.   Acute Anemia of Blood loss:  -possibly from the hematuria.  - will transfuse if H&h drops to less than 7.  - monitor hemoglobin  - holding aspirin and plavix.    Diabetes Mellitus:  - SSI, STOPPED lantus, as he is NPO. And he had a an episode of hypoglycemia.  - hgba1c is 7.5.  - CBG (last 3)   Recent Labs  01/02/14 0803  01/02/14 0837 01/02/14 1216  GLUCAP 57* 144* 93     Acute renal failure:  - probably from sepsis and UTI, dehdyration.  - hydration and repeated renal parameters in am show improvement..   Hypokalemia:  - replete as needed.   Mild acute CHF exacerbation; possibly from the fluids he was given. He was started on lasix, and a repeat CXR showed mild improvement.     Coughing while eating today: - speech and swallow eval ordered , recommended NPO and esophagogram in am show aspiration with liquids, questionable aspiration pneumonia. Will change antibiotics to zosyn. Plan for MBS tomorrow .   Periods of apnea with periods of hyperventilation: suspect he has cheyne stokes breathing.  Patient 's daughter wanted a palliaitve care meeting for goals of care. Meanwhile she doesn t want him to be on CPAP overnight.  Will transfer him to step down for closer monitoring.   DVT prophylaxis.  scd's   DNR Daughter at bedside.  Transferred to step down.   Consultants:  pccm  Procedures:  CXR   Antibiotics:  Rocephin 6/26-6/30  Zosyn 6/30  HPI/Subjective: Confused, with some hallucinations. With cheyene stokes breathing  Objective: Filed Vitals:   01/02/14 1448  BP: 119/54  Pulse: 92  Temp: 98 F (36.7 C)  Resp: 18    Intake/Output Summary (Last 24 hours) at 01/02/14 1657 Last data filed at 01/02/14 1507  Gross per 24 hour  Intake    350 ml  Output   3100 ml  Net  -2750 ml   [employer] PowerFiled Weights  01/01/14 0400 01/01/14 1212 01/01/14 1219  Weight: 78.1 kg (172 lb 2.9 oz) 80.6 kg (177 lb 11.1 oz) 80.5 kg (177 lb 7.5 oz)    Exam:   General: awake and, confused  Cardiovascular: s1s2  Respiratory: diminished air entry at bases. Scattered rales.   Abdomen: soft NT ND BS+  Musculoskeletal: no pedal edema.   Data Reviewed: Basic Metabolic Panel:  Recent Labs Lab 12/28/13 0919 12/29/13 0109 12/30/13 1036 01/01/14 0410 01/02/14 0600  NA 140 133* 138 139  140  K 4.2 4.6 4.1 3.3* 3.5*  CL 100 96 109 105 103  CO2 24 21 20 22 24   GLUCOSE 216* 304* 142* 73 54*  BUN 28* 45* 30* 21 18  CREATININE 0.99 1.93* 1.37* 1.00 1.05  CALCIUM 8.9 8.8 8.1* 8.0* 8.5  MG  --   --   --   --  1.7   Liver Function Tests:  Recent Labs Lab 12/28/13 0919 12/29/13 0109  AST 12 13  ALT 8 7  ALKPHOS 64 61  BILITOT 0.6 0.7  PROT 6.7 6.4  ALBUMIN 3.6 3.4*   No results found for this basename: LIPASE, AMYLASE,  in the last 168 hours No results found for this basename: AMMONIA,  in the last 168 hours CBC:  Recent Labs Lab 12/28/13 0919 12/29/13 0109  12/30/13 0230 12/30/13 1036 12/30/13 1039 01/01/14 0410 01/02/14 0600  WBC 14.8* 23.9*  --   --   --  21.6* 15.6* 19.4*  NEUTROABS 11.4* 20.7*  --   --   --   --   --   --   HGB 11.7* 10.6*  < > 8.1* 8.6* 8.6* 8.1* 8.6*  HCT 35.2* 31.3*  < > 23.9* 25.4* 25.4* 23.8* 25.2*  MCV 86.5 85.5  --   --   --  87.9 86.5 86.0  PLT 193 190  --   --   --  116* 124* 195  < > = values in this interval not displayed. Cardiac Enzymes: No results found for this basename: CKTOTAL, CKMB, CKMBINDEX, TROPONINI,  in the last 168 hours BNP (last 3 results)  Recent Labs  06/21/13 1750 06/22/13 0330  PROBNP 2179.0* 2124.0*   CBG:  Recent Labs Lab 01/01/14 1612 01/01/14 2120 01/02/14 0803 01/02/14 0837 01/02/14 1216  GLUCAP 119* 89 57* 144* 93    Recent Results (from the past 240 hour(s))  URINE CULTURE     Status: None   Collection Time    12/28/13  6:36 AM      Result Value Ref Range Status   Specimen Description URINE, RANDOM   Final   Special Requests NONE   Final   Culture  Setup Time     Final   Value: 12/28/2013 12:43     Performed at [facility]   Colony Count     Final   Value: 60,000 COLONIES/ML     Performed at [facility]   Culture     Final   Value: Multiple bacterial morphotypes present, none predominant. Suggest appropriate recollection if clinically indicated.      Performed at [facility]   Report Status 12/29/2013 FINAL   Final  MRSA PCR SCREENING     Status: None   Collection Time    12/29/13  8:53 AM      Result Value Ref Range Status   MRSA by PCR NEGATIVE  NEGATIVE Final   Comment:  The GeneXpert MRSA Assay (FDA     approved for NASAL specimens     only), is one component of a     comprehensive MRSA colonization     surveillance program. It is not     intended to diagnose MRSA     infection nor to guide or     monitor treatment for     MRSA infections.  CULTURE, BLOOD (ROUTINE X 2)     Status: None   Collection Time    12/29/13  6:00 PM      Result Value Ref Range Status   Specimen Description BLOOD LEFT HAND   Final   Special Requests BOTTLES DRAWN AEROBIC AND ANAEROBIC 10CC   Final   Culture  Setup Time     Final   Value: 12/29/2013 22:56     Performed at [facility]   Culture     Final   Value:        BLOOD CULTURE RECEIVED NO GROWTH TO DATE CULTURE WILL BE HELD FOR 5 DAYS BEFORE ISSUING A FINAL NEGATIVE REPORT     Performed at [facility]   Report Status PENDING   Incomplete  CULTURE, BLOOD (ROUTINE X 2)     Status: None   Collection Time    12/29/13  6:20 PM      Result Value Ref Range Status   Specimen Description BLOOD LEFT ARM   Final   Special Requests BOTTLES DRAWN AEROBIC AND ANAEROBIC 10CC   Final   Culture  Setup Time     Final   Value: 12/29/2013 22:57     Performed at [facility]   Culture     Final   Value:        BLOOD CULTURE RECEIVED NO GROWTH TO DATE CULTURE WILL BE HELD FOR 5 DAYS BEFORE ISSUING A FINAL NEGATIVE REPORT     Performed at [facility]   Report Status PENDING   Incomplete     Studies: Dg Chest 2 View  01/01/2014   CLINICAL DATA:  Short of breath  EXAM: CHEST  2 VIEW  COMPARISON:  12/30/2013  FINDINGS: There is moderate cardiac enlargement. There are moderate bilateral pleural effusions. The interstitial edema pattern appears improved from  previous exam.  IMPRESSION: 1. Interval decrease in pulmonary edema.   Electronically Signed   By: Signa Kell M.D.   On: 01/01/2014 18:09   Dg Esophagus  01/02/2014   CLINICAL DATA:  Dysphagia.  Cough.  EXAM: ESOPHOGRAM/BARIUM SWALLOW  TECHNIQUE: Single contrast examination was performed using  thin barium.  FLUOROSCOPY TIME:  2 min and 33 seconds  COMPARISON:  None.  FINDINGS: Limited piece meal swallowing. No esophageal mass or obstruction. There is a small hiatal hernia and a widely patent mucosal ring distally. No GE reflux was demonstrated. The patient did aspirate when sipping water. Modified barium swallow may be helpful for further evaluation.  IMPRESSION: Small hiatal hernia and widely patent mucosal ring.  Aspiration when sipping water.  Recommend speech evaluation.  No esophageal mass or stricture.   Electronically Signed   By: Loralie Champagne M.D.   On: 01/02/2014 13:40    Scheduled Meds: . antiseptic oral rinse  15 mL Mouth Rinse BID  . cefTRIAXone (ROCEPHIN)  IV  1 g Intravenous Q24H  . finasteride  5 mg Oral Daily  . furosemide  20 mg Oral BID  . insulin aspart  0-9 Units Subcutaneous TID WC  . levothyroxine  50 mcg Oral QAC breakfast  . vitamin A & D       Continuous Infusions: . dextrose 5 % and 0.9% NaCl 50 mL/hr (01/02/14 0931)  . sodium chloride irrigation      Active Problems:   Diabetes mellitus type 2 with complications   CAD (coronary artery disease), native coronary artery   Hypothyroidism (acquired)   Atrial fibrillation with controlled ventricular response   Sepsis    Time spent: 30 minutes.     [facility]  [facility] Pager [phone]. If 7PM-7AM, please contact night-coverage at [email or web address], password [facility] 01/02/2014, 4:57 PM  LOS: 5 days

## 2014-01-02 NOTE — Progress Notes (Signed)
ANTIBIOTIC CONSULT NOTE - INITIAL  Pharmacy Consult for Zosyn Indication: aspiration pneumonia  Allergies  Allergen Reactions  . Codeine Other (See Comments)    unknown  . Demerol [Meperidine] Other (See Comments)     Unknown per pt  mar  . Diltiazem Other (See Comments)     Unknown per pt  mar  . Penicillins Other (See Comments)     Unknown per pt  mar  . Sulfa Drugs Cross Reactors Hives    [facility]    Patient Measurements: Height: 5\' 8"  (172.7 cm) Weight: 177 lb 7.5 oz (80.5 kg) IBW/kg (Calculated) : 68.4  Vital Signs: Temp: 98 F (36.7 C) (06/30 1448) Temp src: Oral (06/30 1448) BP: 119/54 mmHg (06/30 1448) Pulse Rate: 92 (06/30 1448)  Labs:  Recent Labs  01/01/14 0410 01/02/14 0600  WBC 15.6* 19.4*  HGB 8.1* 8.6*  PLT 124* 195  CREATININE 1.00 1.05   Estimated Creatinine Clearance: 41.6 ml/min (by C-G formula based on Cr of 1.05).  Microbiology: Recent Results (from the past 720 hour(s))  URINE CULTURE     Status: None   Collection Time    12/28/13  6:36 AM      Result Value Ref Range Status   Specimen Description URINE, RANDOM   Final   Special Requests NONE   Final   Culture  Setup Time     Final   Value: 12/28/2013 12:43     Performed at [facility]   Colony Count     Final   Value: 60,000 COLONIES/ML     Performed at [facility]   Culture     Final   Value: Multiple bacterial morphotypes present, none predominant. Suggest appropriate recollection if clinically indicated.     Performed at [facility]   Report Status 12/29/2013 FINAL   Final  MRSA PCR SCREENING     Status: None   Collection Time    12/29/13  8:53 AM      Result Value Ref Range Status   MRSA by PCR NEGATIVE  NEGATIVE Final   Comment:            The GeneXpert MRSA Assay (FDA     approved for NASAL specimens     only), is one component of a     comprehensive MRSA colonization     surveillance program. It is not     intended to diagnose  MRSA     infection nor to guide or     monitor treatment for     MRSA infections.  CULTURE, BLOOD (ROUTINE X 2)     Status: None   Collection Time    12/29/13  6:00 PM      Result Value Ref Range Status   Specimen Description BLOOD LEFT HAND   Final   Special Requests BOTTLES DRAWN AEROBIC AND ANAEROBIC 10CC   Final   Culture  Setup Time     Final   Value: 12/29/2013 22:56     Performed at [facility]   Culture     Final   Value:        BLOOD CULTURE RECEIVED NO GROWTH TO DATE CULTURE WILL BE HELD FOR 5 DAYS BEFORE ISSUING A FINAL NEGATIVE REPORT     Performed at [facility]   Report Status PENDING   Incomplete  CULTURE, BLOOD (ROUTINE X 2)     Status: None   Collection Time    12/29/13  6:20 PM      Result Value Ref Range Status   Specimen Description BLOOD LEFT ARM   Final   Special Requests BOTTLES DRAWN AEROBIC AND ANAEROBIC 10CC   Final   Culture  Setup Time     Final   Value: 12/29/2013 22:57     Performed at [facility]   Culture     Final   Value:        BLOOD CULTURE RECEIVED NO GROWTH TO DATE CULTURE WILL BE HELD FOR 5 DAYS BEFORE ISSUING A FINAL NEGATIVE REPORT     Performed at [facility]   Report Status PENDING   Incomplete    Medical History: Past Medical History  Diagnosis Date  . Hypertension   . Diabetes mellitus   . Thyroid disease   . High cholesterol   . Atrial fibrillation   . CHF (congestive heart failure)   . Emphysema     Medications:  Anti-infectives   Start     Dose/Rate Route Frequency Ordered Stop   12/29/13 2200  cefTRIAXone (ROCEPHIN) 1 g in dextrose 5 % 50 mL IVPB  Status:  Discontinued     1 g 100 mL/hr over 30 Minutes Intravenous Every 24 hours 12/29/13 1313 01/02/14 1715   12/29/13 0415  cefTRIAXone (ROCEPHIN) 1 g in dextrose 5 % 50 mL IVPB     1 g 100 mL/hr over 30 Minutes Intravenous  Once 12/29/13 0405 12/29/13 0526     Assessment: Carlos Morton admitted 6/26 with fever, hypotension, tachycardia  and recent antibiotic use for hematuria and urinary retention.  He was started on ceftriaxone on 6/26 for UTI.  On 6/30, patient is noted to have coughing while eating and Pharmacy is consulted to dose Zosyn for r/o aspiration pneumonia.  Noted listed penicillin allergy (unknown rxn), but has tolerated cephalosporins.  Tmax: afebrile  WBCs: 19.4  Renal: SCr 1.05, CrCl ~ 41   Goal of Therapy:  Appropriate abx dosing, eradication of infection.   Plan:   Zosyn 3.375g IV Q8H infused over 4hrs.  Follow renal function and cultures as available.   Lynann Beaverhristine Shade PharmD, BCPS Pager [phone] 01/02/2014 5:25 PM

## 2014-01-02 NOTE — Evaluation (Signed)
SLP Cancellation Note  Patient Details Name: Carlos Morton MRN: [id number] DOB: 1917-03-12   Cancelled treatment:       Reason Eval/Treat Not Completed: Medical issues which prohibited therapy - pt currently with apneic breathing pattern per RN note.  Also reviewed results of esophagram with "positive aspiration when sipping water and hiatal hernia without reflux & widely open mucosal ring" radiologist recommended to consider speech/MBS evaluation.    As pt now pt with Cheynne Stoke's breathing, will follow up next date to determine readiness for po or MBS if md desires.  Recommend continue npo due to current respiratory status and concern for aspiration.   Donavan Burnetamara Kimball, MS [facility] SLP [phone]

## 2014-01-02 NOTE — Progress Notes (Addendum)
Clinical Social Work Department CLINICAL SOCIAL WORK PLACEMENT NOTE 01/02/2014  Patient:  Carlos Morton,Carlos Morton  Account Number:  [id number] Admit date:  12/28/2013  Clinical Social Worker:  Unk LightningHOLLY GERBER, LCSW  Date/time:  01/02/2014 03:45 PM  Clinical Social Work is seeking post-discharge placement for this patient at the following level of care:   SKILLED NURSING   (*CSW will update this form in Epic as items are completed)   01/02/2014  [facility]/family provided with [facility] of Clinical Social Work's list of facilities offering this level of care within the geographic area requested by the patient (or if unable, by the patient's family).  01/02/2014  Patient/family informed of their freedom to choose among providers that offer the needed level of care, that participate in Medicare, Medicaid or managed care program needed by the patient, have an available bed and are willing to accept the patient.  01/02/2014  Patient/family informed of [facility]' ownership interest in [facility], as well as of the fact that they are under no obligation to receive care at this facility.  PASARR submitted to EDS on existing # PASARR number received on   FL2 transmitted to all facilities in geographic area requested by pt/family on  01/02/2014 FL2 transmitted to all facilities within larger geographic area on   Patient informed that his/her managed care company has contracts with or will negotiate with  certain facilities, including the following:     Patient/family informed of bed offers received:  01/03/14 [facility] Physician recommends and patient chooses bed at    [facility]  01/05/14 Patient to be transferred to facility by [facility] Patient and family notified of transfer on 01/05/14 Name of family member notified:  Kathy-dtr  The following physician request were entered in Epic:   Additional Comments:

## 2014-01-02 NOTE — Progress Notes (Signed)
Clinical Social Work  Patient was discussed during progression meeting and RN reports family wanted to talk with CSW about DC planning. CSW went to room but patient is unable to participate in treatment and no family present. CSW called and left a message with patient's dtr Olegario Messier(Kathy) with CSW contact information included.  Paac CiinakHolly Gerber, [place]CS[place] [phone]

## 2014-01-02 NOTE — Progress Notes (Signed)
OT Cancellation Note  Patient Details Name: Carlos Morton MRN: [id number] DOB: 15-Sep-1916   Cancelled Treatment:    Reason Eval/Treat Not Completed: Other (comment) Per nursing tech, pt is in and out of sleep, restless at times this am. Noted pt pulling at covers. Per nursing, pt supposed to go to a swallow eval this am. Will check back at another time as pt currently not able to participate in ADL.  [name], [name] [phone] 01/02/2014, 9:12 AM

## 2014-01-03 DIAGNOSIS — Z515 Encounter for palliative care: Secondary | ICD-10-CM

## 2014-01-03 DIAGNOSIS — I509 Heart failure, unspecified: Secondary | ICD-10-CM

## 2014-01-03 DIAGNOSIS — E876 Hypokalemia: Secondary | ICD-10-CM

## 2014-01-03 LAB — BASIC METABOLIC PANEL
BUN: 15 mg/dL (ref 6–23)
CALCIUM: 8 mg/dL — AB (ref 8.4–10.5)
CO2: 27 mEq/L (ref 19–32)
Chloride: 105 mEq/L (ref 96–112)
Creatinine, Ser: 1.1 mg/dL (ref 0.50–1.35)
GFR calc non Af Amer: 55 mL/min — ABNORMAL LOW (ref >90)
GFR, EST AFRICAN AMERICAN: 64 mL/min — AB (ref >90)
Glucose, Bld: 119 mg/dL — ABNORMAL HIGH (ref 70–99)
POTASSIUM: 3.3 meq/L — AB (ref 3.7–5.3)
Sodium: 144 mEq/L (ref 137–147)

## 2014-01-03 LAB — GLUCOSE, CAPILLARY
Glucose-Capillary: 157 mg/dL — ABNORMAL HIGH (ref 70–99)
Glucose-Capillary: 155 mg/dL — ABNORMAL HIGH (ref 70–99)
Glucose-Capillary: 137 mg/dL — ABNORMAL HIGH (ref 70–99)

## 2014-01-03 MED ORDER — LORAZEPAM 2 MG/ML IJ SOLN
INTRAMUSCULAR | Status: AC
  Administered 2014-01-03: 1 mg
  Filled 2014-01-03: qty 1

## 2014-01-03 MED ORDER — POTASSIUM CHLORIDE 10 MEQ/100ML IV SOLN
10.0000 meq | INTRAVENOUS | Status: AC
  Administered 2014-01-03 (×2): 10 meq via INTRAVENOUS
  Filled 2014-01-03 (×3): qty 100

## 2014-01-03 MED ORDER — DICLOFENAC SODIUM 1 % TD GEL
2.0000 g | Freq: Four times a day (QID) | TRANSDERMAL | Status: DC
Start: 2014-01-03 — End: 2014-01-05
  Administered 2014-01-03 – 2014-01-05 (×8): 2 g via TOPICAL
  Filled 2014-01-03: qty 100

## 2014-01-03 NOTE — Progress Notes (Signed)
Clinical Social Work  CSW spoke with PMT NP who reports that they will follow up with patient tomorrow after swallow study. [facility] is agreeable to accept if that is what is desired by family. CSW will continue to follow to assist with DC planning.  [place]olly Chia Morton, [place] [phone]

## 2014-01-03 NOTE — Progress Notes (Signed)
A Palliative Medicine Consult has been received for Patient ZO:XWRUEAV:Nello Brooke DareKing      DOB: [birth year]      WUJ:[id number].We are attempting to respond as quickly as possible to your patient and families needs.   We will get this scheduled as quickly as possible.  If in the meantime we can assist with symptom management or other questions via phone please do not hesitate to call our PMT cell phone [phone].   We are sorry for the delay, Yannis Gumbs L. Ladona Ridgelaylor, MD MBA The Palliative Medicine Team at [facility] Team Phone: [phone] Pager: [phone]

## 2014-01-03 NOTE — Progress Notes (Signed)
SLP Cancellation Note  Patient Details Name: Carlos Morton MRN: [id number] DOB: 1917/03/01   Cancelled treatment:       Reason Eval/Treat Not Completed: MBS unable to be completed due to medical issues which prohibited therapy (per xray at 11:10 am, pt was very agitated and unable to participate in MBS, not plan for palliative meeting today at noon.    MD please clarify if desire MBS to be rescheduled after meeting.  Thanks.   Donavan Burnetamara Whitley Patchen, MS [facility] SLP [phone]

## 2014-01-03 NOTE — Progress Notes (Signed)
Received call from Yong ChannelAlicia Parker, NP with palliative care team who reports family desires MBS to be conducted with daughter's presence to help in establishing goals and provide more information.  Pt currently asleep and therefore will plan test for Thursday.  Covering SLP will call daughter Kellie MoorKathy Morton at [phone] to provide her advance notice as she reportedly lives approx 20 minutes away.    SLP's opinion is even if pt does not aspirate on MBS, he is likely aspirating due to weakness, respiratory status and his previous report of dysphagia. Thanks.    Donavan Burnetamara Bridgid Printz, MS [facility] SLP [phone]

## 2014-01-03 NOTE — Consult Note (Signed)
Patient IR:SWNIOEV Runkles      DOB: 1916/07/13      OJJ:[id number]     Consult Note from the Palliative Medicine Team at [facility] Requested by: Dr. Karleen Hampshire     PCP: Guadlupe Spanish, MD Reason for Consultation: [place] and options    Phone Number:None  Assessment of patients Current state: Mr. Mozer is a 80 yo male admitted with sepsis UTI and hematuria. Had subsequent acute renal failure (now resolved) and aspiration pneumonia along with CHF exacerbation (systolic, EF 93-71% 69/67/89). Currently NPO with [facility] [place]'s breathing pattern. PMH significant for CHF, diabetes mellitus, emphysema, atrial fibrillation, hypertension, thyroid disease.   I met today with daughter Arbie Cookey ([phone], [phone]) and her son and 3 children via telephone. Son Sung Parodi (via telephone [phone]) also on phone. We discussed Mr. Pinn baseline and they tell me he has been in [place] and has used cane for ambulation but using walker for last 3 months. He was mentally very sharp until this hospitalization. Juliann Pulse says he was only in ALF due to his vision loss and needing help manage his medications. He enjoys singing and any type of music. He was always very active and around people and enjoys eating (they say his wife was an Film/video editor). Family is very concerned with his probable dysphagia but wish to know more information and would like to complete MBS if possible with assistance of Kathy's presence to aide in cooperation. They would like to speak with him if he has a lucid moment but will likely proceed with comfort feeds and he may be eligible for hospice facility.   When we came back to the room after our meeting he had a very wet cough which made me question aspiration on his own secretions. I spoke with Juliann Pulse about this and she asked if Merry Proud should come from [place]. I told her that it may be a good idea for him to come or at least look at flights and have a plan in place. He is still  severely confused and was not able to recognize Juliann Pulse. Juliann Pulse does not believe that [facility] would be good for him to return too. She would prefer SNF or hospice facility, I am leaning towards recommendation for hospice facility after seeing him after our meeting.    Goals of Care: 1.  Code Status: DNR   2. Scope of Treatment: Continue medical interventions at this time. Attempt MBS. Focus on comfort.    4. Disposition: To be determined on outcomes. SNF vs hospice facility.    3. Symptom Management:   1. Anxiety/Agitation: Lorazepam prn.  2. Pain: Voltaren gel to right knee.  3. Bowel Regimen: Miralax prn.  4. Fever: Acetaminophen prn.  5. Nausea/Vomiting: Ondansetron prn.  6. Terminal Secretions: May need scopolamine patch and atropine SL 4 drops every 4 hours prn with increased secretions. Continues to have strong cough as of today.   4. Psychosocial: Emotional support provided to patient and family.    Patient Documents Completed or Given: Document Given Completed  Advanced Directives Pkt    MOST yes   DNR    Gone from [facility]    Hard Choices      Brief HPI: 80 yo male with UTI sepsis, aspiration pneumonia, current aspiration. Also having acute confusion and delirium. Poor prognosis and may need comfort measures and possibly hospice facility.    ROS: Unable to assess - confused.     PMH:  Past Medical History  Diagnosis Date  . Hypertension   . Diabetes mellitus   . Thyroid disease   . High cholesterol   . Atrial fibrillation   . CHF (congestive heart failure)   . Emphysema      PSH: Past Surgical History  Procedure Laterality Date  . Carotid stent      on blood thinner for stent  . Replacement total knee      left knee  . Appendectomy     I have reviewed the FH and SH and  If appropriate update it with new information. Allergies  Allergen Reactions  . Codeine Other (See Comments)    unknown  . Demerol [Meperidine] Other (See Comments)      Unknown per pt  mar  . Diltiazem Other (See Comments)     Unknown per pt  mar  . Penicillins Other (See Comments)     Unknown per pt  mar  . Sulfa Drugs Cross Reactors Hives    Per [facility]   Scheduled Meds: . antiseptic oral rinse  15 mL Mouth Rinse BID  . finasteride  5 mg Oral Daily  . furosemide  20 mg Oral BID  . insulin aspart  0-9 Units Subcutaneous TID WC  . levothyroxine  50 mcg Oral QAC breakfast  . piperacillin-tazobactam (ZOSYN)  IV  3.375 g Intravenous Q8H  . potassium chloride  10 mEq Intravenous Q1 Hr x 3   Continuous Infusions: . dextrose 5 % and 0.9% NaCl 50 mL/hr (01/02/14 0931)  . sodium chloride irrigation     PRN Meds:.acetaminophen, albuterol, LORazepam, ondansetron (ZOFRAN) IV, ondansetron, polyethylene glycol    BP 101/72  Pulse 77  Temp(Src) 98 F (36.7 C) (Oral)  Resp 18  Ht _0  (1.727 m)  Wt 80.5 kg (177 lb 7.5 oz)  BMI 26.99 kg/m2  SpO2 95%   PPS: 20%   Intake/Output Summary (Last 24 hours) at 01/03/14 1035 Last data filed at 01/03/14 0530  Gross per 24 hour  Intake 844.17 ml  Output   2875 ml  Net -2030.83 ml   LBM: 01/02/14                         Physical Exam:  General: Awake, confused and agitated, NAD HEENT:  [place]/AT, no JVD, oral mucosa moist without exudate Chest: Scattered rhonchi, diminished in bases, no labored breathing CVS: RRR, S1 S2 Abdomen: Soft, NT, ND, +BS Ext: MAE, BLE edema improved, warm to touch Neuro: Confused, oriented to person only, did not recognize daughter at bedside, unable to follow commands  Labs: CBC    Component Value Date/Time   WBC 19.4* 01/02/2014 0600   RBC 2.93* 01/02/2014 0600   RBC 2.93* 01/02/2014 0600   HGB 8.6* 01/02/2014 0600   HCT 25.2* 01/02/2014 0600   PLT 195 01/02/2014 0600   MCV 86.0 01/02/2014 0600   MCH 29.4 01/02/2014 0600   MCHC 34.1 01/02/2014 0600   RDW 13.9 01/02/2014 0600   LYMPHSABS 1.2 12/29/2013 0109   MONOABS 1.9* 12/29/2013 0109   EOSABS 0.0 12/29/2013 0109    BASOSABS 0.0 12/29/2013 0109    BMET    Component Value Date/Time   NA 144 01/03/2014 0530   K 3.3* 01/03/2014 0530   CL 105 01/03/2014 0530   CO2 27 01/03/2014 0530   GLUCOSE 119* 01/03/2014 0530   BUN 15 01/03/2014 0530   CREATININE 1.10 01/03/2014 0530   CALCIUM 8.0* 01/03/2014  0530   GFRNONAA 55* 01/03/2014 0530   GFRAA 64* 01/03/2014 0530    CMP     Component Value Date/Time   NA 144 01/03/2014 0530   K 3.3* 01/03/2014 0530   CL 105 01/03/2014 0530   CO2 27 01/03/2014 0530   GLUCOSE 119* 01/03/2014 0530   BUN 15 01/03/2014 0530   CREATININE 1.10 01/03/2014 0530   CALCIUM 8.0* 01/03/2014 0530   PROT 6.4 12/29/2013 0109   ALBUMIN 3.4* 12/29/2013 0109   AST 13 12/29/2013 0109   ALT 7 12/29/2013 0109   ALKPHOS 61 12/29/2013 0109   BILITOT 0.7 12/29/2013 0109   GFRNONAA 55* 01/03/2014 0530   GFRAA 64* 01/03/2014 0530     Time In Time Out Total Time Spent with Patient Total Overall Time  1200 1330 71mn 929m    Greater than 50%  of this time was spent counseling and coordinating care related to the above assessment and plan.  AlVinie SillNP Palliative Medicine Team Pager # [phone]-F 8a-5p) Team Phone # [phone]/Weekends)

## 2014-01-03 NOTE — Progress Notes (Signed)
I have spoken with Ms. Carlos Morton who agrees to meeting with me for goals of care today 7/1 at 1200 noon. Her brother will join us via telephone. Thank you for this consult.  Yong ChannelAlicia Koy Lamp, NP Palliative Medicine Team Pager # [phone] (M-F 8a-5p) Team Phone # [phone] (Nights/Weekends)

## 2014-01-03 NOTE — Progress Notes (Signed)
[facility] PROGRESS NOTE  Carlos Morton GNF:[id number] DOB: [birth year] DOA: 12/28/2013 PCP: Karle Plumber, MD Interim summary: Carlos Morton is a 80 y.o. male with h/o hypertension, DM, afib, CHF, came in yesterday to ED, for acute urinary retention And hematuria yesterday, underwent foley cathter placement and was discharged back to the facility with an antibiotic. He presented later this morning for gross hematuria, . On arrival to ED, he was found to be febrile, hypotension, tachycardic. He was referred to medical service for admission . Currently patient denies any new complaints. He has responded to fluids and his hematuria resolved. He was transferred to regular bed on 6/29.  Assessment/Plan: Sepsis secondary to UTI:  -Started patient on rocephin. Urine and blood cultures ordered. Urine cultures grew 60,000 multiple bacterial morphotypes. hypotension secondary to sepsis. He has responded to fluids. He has received so far 6 liters of fluids and he has put our 5 liters of urine.   Fever, leukocytosis, hypotension:  - Secondary to sepsis from UTI. Urine cultures grew 60,000 multiple bacterial morphotypes.  -he was started on rocephin , later on changed to zosyn to cover aspiration pneumonia.  Leukocytosis worsening.   Hematuria:  - Possibly traumatic. foleyin place, underwent irrigation in ED.  - Urology consult from Dr Brunilda Payor requested and recommendations given.  His urine has cleared up.   Acute Anemia of Blood loss:  -possibly from the hematuria.  - will transfuse if H&h drops to less than 7.  - monitor hemoglobin  - holding aspirin and plavix.   Hypokalemia -Potassium 3.3 on am labs, will replace with IV KCl  Diabetes Mellitus:  - SSI, STOPPED lantus, as he is NPO. And he had a an episode of hypoglycemia.  - hgba1c is 7.5.  - CBG (last 3)   Recent Labs  01/02/14 1740 01/02/14 2211 01/03/14 0704  GLUCAP 118* 109* 157*    Acute renal failure:  - probably from  sepsis and UTI, dehdyration.  - hydration and repeated renal parameters in am show improvement..  Mild acute CHF exacerbation; possibly from the fluids he was given. He was started on lasix, and a repeat CXR showed mild improvement.    Dysphagia - Speech pathology consulted, did not undergo MBS due to agitation  Goals of Care -Palliative Care consulted, plan for family meeting today   DNR Daughter at bedside.  Transferred to step down.   Consultants:  pccm  Procedures:  CXR   Antibiotics:  Rocephin 6/26-6/30  Zosyn 6/30  HPI/Subjective: During my evaluation patient appeared calm in no acute distress, was able to follow simple commands  Objective: Filed Vitals:   01/03/14 0700  BP: 101/72  Pulse: 77  Temp:   Resp: 18    Intake/Output Summary (Last 24 hours) at 01/03/14 1238 Last data filed at 01/03/14 0530  Gross per 24 hour  Intake 844.17 ml  Output   2875 ml  Net -2030.83 ml   Filed Weights   01/01/14 0400 01/01/14 1212 01/01/14 1219  Weight: 78.1 kg (172 lb 2.9 oz) 80.6 kg (177 lb 11.1 oz) 80.5 kg (177 lb 7.5 oz)    Exam:   General: awake and, confused, in no significant distress  Cardiovascular: s1s2  Respiratory: diminished air entry at bases. Scattered rales.   Abdomen: soft NT ND BS+  Musculoskeletal: no pedal edema.   Data Reviewed: Basic Metabolic Panel:  Recent Labs Lab 12/29/13 0109 12/30/13 1036 01/01/14 0410 01/02/14 0600 01/03/14 0530  NA 133* 138 139 140 144  K  4.6 4.1 3.3* 3.5* 3.3*  CL 96 109 105 103 105  CO2 21 20 22 24 27   GLUCOSE 304* 142* 73 54* 119*  BUN 45* 30* 21 18 15   CREATININE 1.93* 1.37* 1.00 1.05 1.10  CALCIUM 8.8 8.1* 8.0* 8.5 8.0*  MG  --   --   --  1.7  --    Liver Function Tests:  Recent Labs Lab 12/28/13 0919 12/29/13 0109  AST 12 13  ALT 8 7  ALKPHOS 64 61  BILITOT 0.6 0.7  PROT 6.7 6.4  ALBUMIN 3.6 3.4*   No results found for this basename: LIPASE, AMYLASE,  in the last 168  hours No results found for this basename: AMMONIA,  in the last 168 hours CBC:  Recent Labs Lab 12/28/13 0919 12/29/13 0109  12/30/13 0230 12/30/13 1036 12/30/13 1039 01/01/14 0410 01/02/14 0600  WBC 14.8* 23.9*  --   --   --  21.6* 15.6* 19.4*  NEUTROABS 11.4* 20.7*  --   --   --   --   --   --   HGB 11.7* 10.6*  < > 8.1* 8.6* 8.6* 8.1* 8.6*  HCT 35.2* 31.3*  < > 23.9* 25.4* 25.4* 23.8* 25.2*  MCV 86.5 85.5  --   --   --  87.9 86.5 86.0  PLT 193 190  --   --   --  116* 124* 195  < > = values in this interval not displayed. Cardiac Enzymes: No results found for this basename: CKTOTAL, CKMB, CKMBINDEX, TROPONINI,  in the last 168 hours BNP (last 3 results)  Recent Labs  06/21/13 1750 06/22/13 0330  PROBNP 2179.0* 2124.0*   CBG:  Recent Labs Lab 01/02/14 0837 01/02/14 1216 01/02/14 1740 01/02/14 2211 01/03/14 0704  GLUCAP 144* 93 118* 109* 157*    Recent Results (from the past 240 hour(s))  URINE CULTURE     Status: None   Collection Time    12/28/13  6:36 AM      Result Value Ref Range Status   Specimen Description URINE, RANDOM   Final   Special Requests NONE   Final   Culture  Setup Time     Final   Value: 12/28/2013 12:43     Performed at [facility]   Colony Count     Final   Value: 60,000 COLONIES/ML     [facility]formed at [facility]   Culture     Final   Value: Multiple bacterial morphotypes present, none predominant. Suggest appropriate recollection if clinically indicated.     Performed at [facility]   Report Status 12/29/2013 FINAL   Final  MRSA PCR SCREENING     Status: None   Collection Time    12/29/13  8:53 AM      Result Value Ref Range Status   MRSA by PCR NEGATIVE  NEGATIVE Final   Comment:            The GeneXpert MRSA Assay (FDA     approved for NASAL specimens     only), is one component of a     comprehensive MRSA colonization     surveillance program. It is not     intended to diagnose MRSA     infection  nor to guide or     monitor treatment for     MRSA infections.  CULTURE, BLOOD (ROUTINE X 2)     Status: None   Collection Time  12/29/13  6:00 PM      Result Value Ref Range Status   Specimen Description BLOOD LEFT HAND   Final   Special Requests BOTTLES DRAWN AEROBIC AND ANAEROBIC 10CC   Final   Culture  Setup Time     Final   Value: 12/29/2013 22:56     Performed at [facility]   Culture     Final   Value:        BLOOD CULTURE RECEIVED NO GROWTH TO DATE CULTURE WILL BE HELD FOR 5 DAYS BEFORE ISSUING A FINAL NEGATIVE REPORT     Performed at [facility]   Report Status PENDING   Incomplete  CULTURE, BLOOD (ROUTINE X 2)     Status: None   Collection Time    12/29/13  6:20 PM      Result Value Ref Range Status   Specimen Description BLOOD LEFT ARM   Final   Special Requests BOTTLES DRAWN AEROBIC AND ANAEROBIC 10CC   Final   Culture  Setup Time     Final   Value: 12/29/2013 22:57     Performed at [facility]   Culture     Final   Value:        BLOOD CULTURE RECEIVED NO GROWTH TO DATE CULTURE WILL BE HELD FOR 5 DAYS BEFORE ISSUING A FINAL NEGATIVE REPORT     Performed at [facility]   Report Status PENDING   Incomplete     Studies: Dg Chest 2 View  01/01/2014   CLINICAL DATA:  Short of breath  EXAM: CHEST  2 VIEW  COMPARISON:  12/30/2013  FINDINGS: There is moderate cardiac enlargement. There are moderate bilateral pleural effusions. The interstitial edema pattern appears improved from previous exam.  IMPRESSION: 1. Interval decrease in pulmonary edema.   Electronically Signed   By: Signa Kell M.D.   On: 01/01/2014 18:09   Dg Esophagus  01/02/2014   CLINICAL DATA:  Dysphagia.  Cough.  EXAM: ESOPHOGRAM/BARIUM SWALLOW  TECHNIQUE: Single contrast examination was performed using  thin barium.  FLUOROSCOPY TIME:  2 min and 33 seconds  COMPARISON:  None.  FINDINGS: Limited piece meal swallowing. No esophageal mass or obstruction. There is a small  hiatal hernia and a widely patent mucosal ring distally. No GE reflux was demonstrated. The patient did aspirate when sipping water. Modified barium swallow may be helpful for further evaluation.  IMPRESSION: Small hiatal hernia and widely patent mucosal ring.  Aspiration when sipping water.  Recommend speech evaluation.  No esophageal mass or stricture.   Electronically Signed   By: Loralie Champagne M.D.   On: 01/02/2014 13:40    Scheduled Meds: . antiseptic oral rinse  15 mL Mouth Rinse BID  . finasteride  5 mg Oral Daily  . furosemide  20 mg Oral BID  . insulin aspart  0-9 Units Subcutaneous TID WC  . levothyroxine  50 mcg Oral QAC breakfast  . LORazepam      . piperacillin-tazobactam (ZOSYN)  IV  3.375 g Intravenous Q8H   Continuous Infusions: . dextrose 5 % and 0.9% NaCl 50 mL/hr (01/02/14 0931)  . sodium chloride irrigation      Active Problems:   Diabetes mellitus type 2 with complications   CAD (coronary artery disease), native coronary artery   Hypothyroidism (acquired)   Atrial fibrillation with controlled ventricular response   Sepsis    Time spent: 30 minutes.     Jeralyn Bennett  [facility]  Pager [phone]. If 7PM-7AM, please contact night-coverage at [email or web address], password [email or web address] 01/03/2014, 12:38 PM  LOS: 6 days

## 2014-01-03 NOTE — Progress Notes (Signed)
OT Cancellation Note  Patient Details Name: Denny LevyClayton Sirek MRN: [id number] DOB: 26-Dec-1916   Cancelled Treatment:    Noted pt plans to DC to SNF. Will defer OT eval to SNF. Alba CoryREDDING, Braylon Lemmons D 01/03/2014, 8:42 AM

## 2014-01-03 NOTE — Progress Notes (Signed)
PT Cancellation Note  ___Treatment cancelled today due to medical issues with patient which prohibited   therapy  ___ Treatment cancelled today due to patient receiving procedure or test   ___ Treatment cancelled today due to patient's refusal to participate   ___ Treatment cancelled today due to   PT Cancellation Note  ___Treatment cancelled today due to medical issues with patient which prohibited therapy  ___ Treatment cancelled today due to patient receiving procedure or test   ___ Treatment cancelled today due to patient's refusal to participate   _X_ Treatment cancelled today due to Palliative Meeting at noon then pt had episode of agitation.  Currently sleeping.   Signature:  Felecia ShellingLori Durwood Dittus  PTA [facility]  Acute  Rehab Pager      [phone]

## 2014-01-04 ENCOUNTER — Inpatient Hospital Stay (HOSPITAL_COMMUNITY): Payer: Medicare Other

## 2014-01-04 LAB — CBC
HCT: 28.4 % — ABNORMAL LOW (ref 39.0–52.0)
Hemoglobin: 9.2 g/dL — ABNORMAL LOW (ref 13.0–17.0)
MCH: 28.6 pg (ref 26.0–34.0)
MCHC: 32.4 g/dL (ref 30.0–36.0)
MCV: 88.2 fL (ref 78.0–100.0)
Platelets: 242 10*3/uL (ref 150–400)
RBC: 3.22 MIL/uL — ABNORMAL LOW (ref 4.22–5.81)
RDW: 14.1 % (ref 11.5–15.5)
WBC: 17.6 10*3/uL — ABNORMAL HIGH (ref 4.0–10.5)

## 2014-01-04 LAB — BASIC METABOLIC PANEL
ANION GAP: 12 (ref 5–15)
BUN: 14 mg/dL (ref 6–23)
CALCIUM: 8.4 mg/dL (ref 8.4–10.5)
CO2: 26 meq/L (ref 19–32)
CREATININE: 0.97 mg/dL (ref 0.50–1.35)
Chloride: 104 mEq/L (ref 96–112)
GFR calc Af Amer: 79 mL/min — ABNORMAL LOW (ref >90)
GFR calc non Af Amer: 68 mL/min — ABNORMAL LOW (ref >90)
Glucose, Bld: 163 mg/dL — ABNORMAL HIGH (ref 70–99)
Potassium: 3.4 mEq/L — ABNORMAL LOW (ref 3.7–5.3)
Sodium: 142 mEq/L (ref 137–147)

## 2014-01-04 LAB — GLUCOSE, CAPILLARY
GLUCOSE-CAPILLARY: 180 mg/dL — AB (ref 70–99)
GLUCOSE-CAPILLARY: 139 mg/dL — AB (ref 70–99)
GLUCOSE-CAPILLARY: 146 mg/dL — AB (ref 70–99)
Glucose-Capillary: 166 mg/dL — ABNORMAL HIGH (ref 70–99)

## 2014-01-04 LAB — CULTURE, BLOOD (ROUTINE X 2)
CULTURE: NO GROWTH
Culture: NO GROWTH

## 2014-01-04 MED ORDER — POTASSIUM CHLORIDE CRYS ER 20 MEQ PO TBCR
40.0000 meq | EXTENDED_RELEASE_TABLET | Freq: Once | ORAL | Status: AC
  Administered 2014-01-04: 40 meq via ORAL
  Filled 2014-01-04: qty 2

## 2014-01-04 NOTE — Progress Notes (Signed)
Clinical Social Work  CSW met with patient, dtr, and brother who report their plan is for patient to DC to SNF once medically stable. CSW spoke with [facility] who remains agreeable to accept at DC. CSW will continue to follow in order to assist as needed.  [place], [place] [phone]

## 2014-01-04 NOTE — Progress Notes (Signed)
[facility] PROGRESS NOTE  Carlos Morton YNW:[id number] DOB: 29-Nov-1916 DOA: 12/28/2013 PCP: Karle Plumber, MD Interim summary: Carlos Morton is a 80 y.o. male with h/o hypertension, DM, afib, CHF, came in yesterday to ED, for acute urinary retention And hematuria yesterday, underwent foley cathter placement and was discharged back to the facility with an antibiotic. He presented later this morning for gross hematuria, . On arrival to ED, he was found to be febrile, hypotension, tachycardic. He was referred to medical service for admission . Currently patient denies any new complaints. He has responded to fluids and his hematuria resolved. He was transferred to regular bed on 6/29.  Assessment/Plan: Sepsis secondary to UTI:  -Clinically improving -Evidenced by white count of 21,600, acute encephalopathy, HR of 106, RR of 25.  -Will continue one more day of IV Zosyn, likely transition him to oral AB therapy tomorrow.   Fever, leukocytosis, hypotension:  - Secondary to sepsis from UTI. Urine cultures grew 60,000 multiple bacterial morphotypes.  -he was started on rocephin , later on changed to zosyn to cover aspiration pneumonia.   -Improved  Dysphagia -There were concerns with aspiration -He presented with a functional decline having acute encephalopathy -Speech/path consulted found to have mild-moderate multi-factorial dysphagia with oropharyngeal and esophageal components -Diet advanced to regular diet today  Hematuria:  - Possibly traumatic. foleyin place, underwent irrigation in ED.  - Urology consult from Dr Brunilda Payor requested and recommendations given.  His urine has cleared up.   Acute Anemia of Blood loss:  -possibly from the hematuria.  - will transfuse if H&h drops to less than 7.  - monitor hemoglobin  - holding aspirin and plavix.   Hypokalemia -Replacing with Kdur  Diabetes Mellitus:  - SSI, STOPPED lantus, as he is NPO. And he had a an episode of hypoglycemia.  -  hgba1c is 7.5.  - CBG (last 3)   Recent Labs  01/03/14 1252 01/03/14 1704 01/04/14 0748  GLUCAP 155* 137* 166*    Acute renal failure:  - probably from sepsis and UTI, dehdyration.  - hydration and repeated renal parameters in am show improvement..  Mild acute CHF exacerbation; possibly from the fluids he was given. He was started on lasix, and a repeat CXR showed mild improvement.    Goals of Care -Palliative Care consulted, MOST form was filled.   DNR Daughter at bedside.    Consultants:  pccm  Procedures:  CXR   Antibiotics:  Rocephin 6/26-6/30  Zosyn 6/30  HPI/Subjective: Patient seems improved today, he is more alert and is oriented to person. Family present at bedside, they also feel he is much improved today as he was able to interact with them.   Objective: Filed Vitals:   01/04/14 0531  BP: 123/89  Pulse: 79  Temp: 98.3 F (36.8 C)  Resp: 16    Intake/Output Summary (Last 24 hours) at 01/04/14 1231 Last data filed at 01/04/14 0700  Gross per 24 hour  Intake 1507.5 ml  Output      0 ml  Net 1507.5 ml   Filed Weights   01/01/14 0400 01/01/14 1212 01/01/14 1219  Weight: 78.1 kg (172 lb 2.9 oz) 80.6 kg (177 lb 11.1 oz) 80.5 kg (177 lb 7.5 oz)    Exam:   General: awake alert, following commands  Cardiovascular: s1s2  Respiratory: diminished air entry at bases. Scattered rales.   Abdomen: soft NT ND BS+  Musculoskeletal: no pedal edema.   Data Reviewed: Basic Metabolic Panel:  Recent Labs  Lab 12/30/13 1036 01/01/14 0410 01/02/14 0600 01/03/14 0530 01/04/14 0500  NA 138 139 140 144 142  K 4.1 3.3* 3.5* 3.3* 3.4*  CL 109 105 103 105 104  CO2 20 22 24 27 26   GLUCOSE 142* 73 54* 119* 163*  BUN 30* 21 18 15 14   CREATININE 1.37* 1.00 1.05 1.10 0.97  CALCIUM 8.1* 8.0* 8.5 8.0* 8.4  MG  --   --  1.7  --   --    Liver Function Tests:  Recent Labs Lab 12/29/13 0109  AST 13  ALT 7  ALKPHOS 61  BILITOT 0.7  PROT 6.4   ALBUMIN 3.4*   No results found for this basename: LIPASE, AMYLASE,  in the last 168 hours No results found for this basename: AMMONIA,  in the last 168 hours CBC:  Recent Labs Lab 12/29/13 0109  12/30/13 1036 12/30/13 1039 01/01/14 0410 01/02/14 0600 01/04/14 0500  WBC 23.9*  --   --  21.6* 15.6* 19.4* 17.6*  NEUTROABS 20.7*  --   --   --   --   --   --   HGB 10.6*  < > 8.6* 8.6* 8.1* 8.6* 9.2*  HCT 31.3*  < > 25.4* 25.4* 23.8* 25.2* 28.4*  MCV 85.5  --   --  87.9 86.5 86.0 88.2  PLT 190  --   --  116* 124* 195 242  < > = values in this interval not displayed. Cardiac Enzymes: No results found for this basename: CKTOTAL, CKMB, CKMBINDEX, TROPONINI,  in the last 168 hours BNP (last 3 results)  Recent Labs  06/21/13 1750 06/22/13 0330  PROBNP 2179.0* 2124.0*   CBG:  Recent Labs Lab 01/02/14 2211 01/03/14 0704 01/03/14 1252 01/03/14 1704 01/04/14 0748  GLUCAP 109* 157* 155* 137* 166*    Recent Results (from the past 240 hour(s))  URINE CULTURE     Status: None   Collection Time    12/28/13  6:36 AM      Result Value Ref Range Status   Specimen Description URINE, RANDOM   Final   Special Requests NONE   Final   Culture  Setup Time     Final   Value: 12/28/2013 12:43     Performed at [facility]   Colony Count     Final   Value: 60,000 COLONIES/ML     Performed at [facility]   Culture     Final   Value: Multiple bacterial morphotypes present, none predominant. Suggest appropriate recollection if clinically indicated.     Performed at [facility]   Report Status 12/29/2013 FINAL   Final  MRSA PCR SCREENING     Status: None   Collection Time    12/29/13  8:53 AM      Result Value Ref Range Status   MRSA by PCR NEGATIVE  NEGATIVE Final   Comment:            The GeneXpert MRSA Assay (FDA     approved for NASAL specimens     only), is one component of a     comprehensive MRSA colonization     surveillance program. It is not      intended to diagnose MRSA     infection nor to guide or     monitor treatment for     MRSA infections.  CULTURE, BLOOD (ROUTINE X 2)     Status: None   Collection Time    12/29/13  6:00 PM      Result Value Ref Range Status   Specimen Description BLOOD LEFT HAND   Final   Special Requests BOTTLES DRAWN AEROBIC AND ANAEROBIC 10CC   Final   Culture  Setup Time     Final   Value: 12/29/2013 22:56     Performed at [facility]   Culture     Final   Value: NO GROWTH 5 DAYS     Performed at [facility]   Report Status 01/04/2014 FINAL   Final  CULTURE, BLOOD (ROUTINE X 2)     Status: None   Collection Time    12/29/13  6:20 PM      Result Value Ref Range Status   Specimen Description BLOOD LEFT ARM   Final   Special Requests BOTTLES DRAWN AEROBIC AND ANAEROBIC 10CC   Final   Culture  Setup Time     Final   Value: 12/29/2013 22:57     Performed at [facility]   Culture     Final   Value: NO GROWTH 5 DAYS     Performed at [facility]   Report Status 01/04/2014 FINAL   Final     Studies: Dg Swallowing Func-speech Pathology  01/04/2014   Vivi Ferns McCoy, CCC-SLP     01/04/2014  9:47 AM Objective Swallowing Evaluation: Modified Barium Swallowing Study   Patient Details  Name: Carlos Morton MRN: [id number] Date of Birth: 04-Dec-1916  Today's Date: 01/04/2014 Time: 0840-0900 SLP Time Calculation (min): 20 min  Past Medical History:  Past Medical History  Diagnosis Date  . Hypertension   . Diabetes mellitus   . Thyroid disease   . High cholesterol   . Atrial fibrillation   . CHF (congestive heart failure)   . Emphysema    Past Surgical History:  Past Surgical History  Procedure Laterality Date  . Carotid stent      on blood thinner for stent  . Replacement total knee      left knee  . Appendectomy     HPI:  Pt adm to [facility] 12/28/13 with sepsis. PMH + for CHF, hematuria.  Pt  reports recent difficulty swallowing, lack of appetite, breathing  difficulties and "laryngitis".   CXR 6/27 showed worsening CHF,  moderate bilateral pleural effusion ? ATX or infection, repeat  CXR 6/29 showed decrease in edema.   BSE completed with concern  for primary esophageal deficits, barium swallow completed with  aspiration of water, small hiatal hernia. MBS recommended to be  considered and was ordered.     Assessment / Plan / Recommendation Clinical Impression  Dysphagia Diagnosis: Moderate oral phase dysphagia;Moderate  pharyngeal phase dysphagia (esophageal component) Clinical impression: Patient presents with a mild-moderate  multi-factorial dysphagia with oropharyngeal and esophageal  components. Timing  of swallow functional however mild BOT,  laryngeal, and pharyngeal weakness as well as known mild  esophageal deficits results in mild-moderate pharyngeal residuals  post swallow which lead to infrequent trace penetration of  liquids which clear quickly with spontaneous dry swallows.  Additionally, these dry swallows aid in clearance of pharynx,  decreasing risk of aspiration overall. Patient impulsive. SLP  providing moderate verbal and tactile cueing for small controlled  single sips to additionally decrease episodes of penetration,  decreasing aspiration risk. Education complete with patient and  family who were present for study regarding results and  recommendations. Although no aspiration noted during today's  exam, combination of suspected mild dysphagia at baseline  with  mild esophageal impairments, respiratory deficits, and AMS do  increase risk. Episodic aspiration possible however at this time,    modifications to diet are likely not to be of great impact.     Treatment Recommendation  Therapy as outlined in treatment plan below    Diet Recommendation Regular;Thin liquid   Liquid Administration via: Cup;No straw Medication Administration: Whole meds with liquid Supervision: Patient able to self feed;Full supervision/cueing  for compensatory strategies Compensations: Slow rate;Small  sips/bites;Multiple dry swallows  after each bite/sip;Follow solids with liquid Postural Changes and/or Swallow Maneuvers: Seated upright 90  degrees;Upright 30-60 min after meal    Other  Recommendations Oral Care Recommendations: Oral care BID   Follow Up Recommendations  Skilled Nursing facility    Frequency and Duration min 2x/week  1 week           General Date of Onset: 01/01/14 HPI: Pt adm to [facility] 12/28/13 with sepsis. PMH + for CHF, hematuria.   Pt reports recent difficulty swallowing, lack of appetite,  breathing difficulties and "laryngitis".  CXR 6/27 showed  worsening CHF, moderate bilateral pleural effusion ? ATX or  infection, repeat CXR 6/29 showed decrease in edema.   BSE  completed with concern for primary esophageal deficits, barium  swallow completed with aspiration of water, small hiatal hernia.  MBS recommended to be considered and was ordered. Type of Study: Modified Barium Swallowing Study Reason for Referral: Objectively evaluate swallowing function Previous Swallow Assessment: see BSE Diet Prior to this Study: NPO (except ice chips) Temperature Spikes Noted: No Respiratory Status: Nasal cannula History of Recent Intubation: No Behavior/Cognition: Alert;Cooperative;Pleasant mood Oral Cavity - Dentition: Adequate natural dentition Oral Motor / Sensory Function: Impaired - see Bedside swallow  eval Self-Feeding Abilities: Able to feed self Patient Positioning: Upright in chair Baseline Vocal Quality: Hoarse;Breathy Volitional Cough: Weak Volitional Swallow: Able to elicit Anatomy: Within functional limits Pharyngeal Secretions: Not observed secondary MBS    Reason for Referral Objectively evaluate swallowing function   Oral Phase Oral Preparation/Oral Phase Oral Phase: WFL   Pharyngeal Phase Pharyngeal Phase Pharyngeal Phase: Impaired Pharyngeal - Nectar Pharyngeal - Nectar Teaspoon: Reduced tongue base  retraction;Reduced pharyngeal peristalsis;Reduced anterior  laryngeal mobility;Reduced  laryngeal elevation;Pharyngeal residue  - valleculae;Pharyngeal residue - pyriform sinuses Pharyngeal - Thin Pharyngeal - Thin Teaspoon: Reduced tongue base  retraction;Reduced pharyngeal peristalsis;Reduced anterior  laryngeal mobility;Reduced laryngeal elevation;Pharyngeal residue  - valleculae;Pharyngeal residue - pyriform sinuses Pharyngeal - Thin Cup: Reduced tongue base retraction;Reduced  pharyngeal peristalsis;Reduced anterior laryngeal  mobility;Reduced laryngeal elevation;Pharyngeal residue -  valleculae;Pharyngeal residue - pyriform  sinuses;Penetration/Aspiration after swallow Penetration/Aspiration details (thin cup): Material enters  airway, remains ABOVE vocal cords and not ejected out (clears  with spontaneous dry swallows) Pharyngeal - Solids Pharyngeal - Puree: Delayed swallow initiation;Premature spillage  to valleculae;Pharyngeal residue - valleculae Pharyngeal - Mechanical Soft: Delayed swallow  initiation;Premature spillage to valleculae;Pharyngeal residue -  valleculae Pharyngeal - Pill: Delayed swallow initiation;Premature spillage  to valleculae;Pharyngeal residue - pyriform sinuses  Cervical Esophageal Phase    GO   Leah McCoy MA, CCC-SLP [phone]  Cervical Esophageal Phase Cervical Esophageal Phase: Impaired Cervical Esophageal Phase - Comment Cervical Esophageal Comment: decreased UES relaxation, functional  however         McCoy Leah Meryl 01/04/2014, 9:46 AM     Scheduled Meds: . antiseptic oral rinse  15 mL Mouth Rinse BID  . diclofenac sodium  2 g Topical QID  . finasteride  5 mg Oral Daily  . furosemide  20 mg Oral BID  . insulin aspart  0-9 Units Subcutaneous TID WC  . levothyroxine  50 mcg Oral QAC breakfast  . piperacillin-tazobactam (ZOSYN)  IV  3.375 g Intravenous Q8H  . potassium chloride  40 mEq Oral Once   Continuous Infusions: . dextrose 5 % and 0.9% NaCl 40 mL/hr at 01/04/14 0245  . sodium chloride irrigation      Active Problems:   Diabetes mellitus  type 2 with complications   CAD (coronary artery disease), native coronary artery   Hypothyroidism (acquired)   Atrial fibrillation with controlled ventricular response   Sepsis    Time spent: 30 minutes.     Jeralyn BennettZAMORA, Maylani Embree  [facility] Pager [phone]. If 7PM-7AM, please contact night-coverage at [email or web address], password [email or web address] 01/04/2014, 12:31 PM  LOS: 7 days

## 2014-01-04 NOTE — Procedures (Signed)
Objective Swallowing Evaluation: Modified Barium Swallowing Study  Patient Details  Name: Carlos Morton MRN: [id number] Date of Birth: April 02, 1917  Today's Date: 01/04/2014 Time: 0840-0900 SLP Time Calculation (min): 20 min  Past Medical History:  Past Medical History  Diagnosis Date  . Hypertension   . Diabetes mellitus   . Thyroid disease   . High cholesterol   . Atrial fibrillation   . CHF (congestive heart failure)   . Emphysema    Past Surgical History:  Past Surgical History  Procedure Laterality Date  . Carotid stent      on blood thinner for stent  . Replacement total knee      left knee  . Appendectomy     HPI:  [facility] with sepsis. PMH + for CHF, hematuria.  Pt reports recent difficulty swallowing, lack of appetite, breathing difficulties and "laryngitis".  CXR 6/27 showed worsening CHF, moderate bilateral pleural effusion ? ATX or infection, repeat CXR 6/29 showed decrease in edema.   BSE completed with concern for primary esophageal deficits, barium swallow completed with aspiration of water, small hiatal hernia. MBS recommended to be considered and was ordered.     Assessment / Plan / Recommendation Clinical Impression  Dysphagia Diagnosis: Moderate oral phase dysphagia;Moderate pharyngeal phase dysphagia (esophageal component) Clinical impression: Patient presents with a mild-moderate multi-factorial dysphagia with oropharyngeal and esophageal components. Timing  of swallow functional however mild BOT, laryngeal, and pharyngeal weakness as well as known mild esophageal deficits results in mild-moderate pharyngeal residuals post swallow which lead to infrequent trace penetration of liquids which clear quickly with spontaneous dry swallows. Additionally, these dry swallows aid in clearance of pharynx, decreasing risk of aspiration overall. Patient impulsive. SLP providing moderate verbal and tactile cueing for small controlled single sips to additionally  decrease episodes of penetration, decreasing aspiration risk. Education complete with patient and family who were present for study regarding results and recommendations. Although no aspiration noted during today's exam, combination of suspected mild dysphagia at baseline with mild esophageal impairments, respiratory deficits, and AMS do increase risk. Episodic aspiration possible however at this time,   modifications to diet are likely not to be of great impact.     Treatment Recommendation  Therapy as outlined in treatment plan below    Diet Recommendation Regular;Thin liquid   Liquid Administration via: Cup;No straw Medication Administration: Whole meds with liquid Supervision: Patient able to self feed;Full supervision/cueing for compensatory strategies Compensations: Slow rate;Small sips/bites;Multiple dry swallows after each bite/sip;Follow solids with liquid Postural Changes and/or Swallow Maneuvers: Seated upright 90 degrees;Upright 30-60 min after meal    Other  Recommendations Oral Care Recommendations: Oral care BID   Follow Up Recommendations  Skilled Nursing facility    Frequency and Duration min 2x/week  1 week           General Date of Onset: 01/01/14 HPI: [facility] with sepsis. PMH + for CHF, hematuria.  Pt reports recent difficulty swallowing, lack of appetite, breathing difficulties and "laryngitis".  CXR 6/27 showed worsening CHF, moderate bilateral pleural effusion ? ATX or infection, repeat CXR 6/29 showed decrease in edema.   BSE completed with concern for primary esophageal deficits, barium swallow completed with aspiration of water, small hiatal hernia. MBS recommended to be considered and was ordered. Type of Study: Modified Barium Swallowing Study Reason for Referral: Objectively evaluate swallowing function Previous Swallow Assessment: see BSE Diet Prior to this Study: NPO (except ice chips) Temperature Spikes Noted: No Respiratory  Status: Nasal  cannula History of Recent Intubation: No Behavior/Cognition: Alert;Cooperative;Pleasant mood Oral Cavity - Dentition: Adequate natural dentition Oral Motor / Sensory Function: Impaired - see Bedside swallow eval Self-Feeding Abilities: Able to feed self Patient Positioning: Upright in chair Baseline Vocal Quality: Hoarse;Breathy Volitional Cough: Weak Volitional Swallow: Able to elicit Anatomy: Within functional limits Pharyngeal Secretions: Not observed secondary MBS    Reason for Referral Objectively evaluate swallowing function   Oral Phase Oral Preparation/Oral Phase Oral Phase: WFL   Pharyngeal Phase Pharyngeal Phase Pharyngeal Phase: Impaired Pharyngeal - Nectar Pharyngeal - Nectar Teaspoon: Reduced tongue base retraction;Reduced pharyngeal peristalsis;Reduced anterior laryngeal mobility;Reduced laryngeal elevation;Pharyngeal residue - valleculae;Pharyngeal residue - pyriform sinuses Pharyngeal - Thin Pharyngeal - Thin Teaspoon: Reduced tongue base retraction;Reduced pharyngeal peristalsis;Reduced anterior laryngeal mobility;Reduced laryngeal elevation;Pharyngeal residue - valleculae;Pharyngeal residue - pyriform sinuses Pharyngeal - Thin Cup: Reduced tongue base retraction;Reduced pharyngeal peristalsis;Reduced anterior laryngeal mobility;Reduced laryngeal elevation;Pharyngeal residue - valleculae;Pharyngeal residue - pyriform sinuses;Penetration/Aspiration after swallow Penetration/Aspiration details (thin cup): Material enters airway, remains ABOVE vocal cords and not ejected out (clears with spontaneous dry swallows) Pharyngeal - Solids Pharyngeal - Puree: Delayed swallow initiation;Premature spillage to valleculae;Pharyngeal residue - valleculae Pharyngeal - Mechanical Soft: Delayed swallow initiation;Premature spillage to valleculae;Pharyngeal residue - valleculae Pharyngeal - Pill: Delayed swallow initiation;Premature spillage to valleculae;Pharyngeal residue - pyriform  sinuses  Cervical Esophageal Phase    GO   Ferdinand LangoLeah Venissa Nappi MA, CCC-SLP [phone]  Cervical Esophageal Phase Cervical Esophageal Phase: Impaired Cervical Esophageal Phase - Comment Cervical Esophageal Comment: decreased UES relaxation, functional however         Elonzo Sopp Meryl 01/04/2014, 9:46 AM

## 2014-01-04 NOTE — Progress Notes (Signed)
Progress Note from the Palliative Medicine Team at [facility]: I met with Mr. Hollister, Wessler and Merry Proud this morning. He has made a drastic improvement this morning and is sitting up alert, oriented and conversing and able to eat breakfast. Plan is for another day of antibiotics and transfer to [facility] for rehab tomorrow. We completed MOST form: DNR, limited interventions with transfer back to hospital as needed, give antibiotics if indicated, IVF for defined trial period, NO feeding tube. Family was pleased that Mr. Rickman was able to make these decisions himself. They also understand the risks for aspiration and agree to have palliative follow at SNF. He is most concerned with being able to drink his Pepsi which he has at bedside. He is a very pleasant man and I am pleased he has improved and is less agitated.     Objective: Allergies  Allergen Reactions  . Codeine Other (See Comments)    unknown  . Demerol [Meperidine] Other (See Comments)     Unknown per pt  mar  . Diltiazem Other (See Comments)     Unknown per pt  mar  . Penicillins Other (See Comments)     Unknown per pt  mar  . Sulfa Drugs Cross Reactors Hives    Per [facility]   Scheduled Meds: . antiseptic oral rinse  15 mL Mouth Rinse BID  . diclofenac sodium  2 g Topical QID  . finasteride  5 mg Oral Daily  . furosemide  20 mg Oral BID  . insulin aspart  0-9 Units Subcutaneous TID WC  . levothyroxine  50 mcg Oral QAC breakfast  . piperacillin-tazobactam (ZOSYN)  IV  3.375 g Intravenous Q8H   Continuous Infusions: . dextrose 5 % and 0.9% NaCl 40 mL/hr at 01/04/14 0245  . sodium chloride irrigation     PRN Meds:.acetaminophen, albuterol, LORazepam, ondansetron (ZOFRAN) IV, ondansetron, polyethylene glycol  BP 123/89  Pulse 79  Temp(Src) 98.3 F (36.8 C) (Oral)  Resp 16  Ht _0  (1.727 m)  Wt 80.5 kg (177 lb 7.5 oz)  BMI 26.99 kg/m2  SpO2 93%   PPS: 30%     Intake/Output Summary (Last 24 hours)  at 01/04/14 1110 Last data filed at 01/04/14 0700  Gross per 24 hour  Intake 1507.5 ml  Output      0 ml  Net 1507.5 ml      LBM: 01/02/14      Physical Exam:  General: NAD, thin, frail HEENT: [place]/AT, no JVD, oral mucosa moist without exudate  Chest: Scattered rhonchi improved, more air movement in bases, no labored breathing  CVS: RRR, S1 S2  Abdomen: Soft, NT, ND, +BS  Ext: MAE, BLE edema improved, warm to touch  Neuro: Oriented to person/place, follows commands   Labs: CBC    Component Value Date/Time   WBC 17.6* 01/04/2014 0500   RBC 3.22* 01/04/2014 0500   RBC 2.93* 01/02/2014 0600   HGB 9.2* 01/04/2014 0500   HCT 28.4* 01/04/2014 0500   PLT 242 01/04/2014 0500   MCV 88.2 01/04/2014 0500   MCH 28.6 01/04/2014 0500   MCHC 32.4 01/04/2014 0500   RDW 14.1 01/04/2014 0500   LYMPHSABS 1.2 12/29/2013 0109   MONOABS 1.9* 12/29/2013 0109   EOSABS 0.0 12/29/2013 0109   BASOSABS 0.0 12/29/2013 0109    BMET    Component Value Date/Time   NA 142 01/04/2014 0500   K 3.4* 01/04/2014 0500   CL 104 01/04/2014 0500  CO2 26 01/04/2014 0500   GLUCOSE 163* 01/04/2014 0500   BUN 14 01/04/2014 0500   CREATININE 0.97 01/04/2014 0500   CALCIUM 8.4 01/04/2014 0500   GFRNONAA 68* 01/04/2014 0500   GFRAA 79* 01/04/2014 0500    CMP     Component Value Date/Time   NA 142 01/04/2014 0500   K 3.4* 01/04/2014 0500   CL 104 01/04/2014 0500   CO2 26 01/04/2014 0500   GLUCOSE 163* 01/04/2014 0500   BUN 14 01/04/2014 0500   CREATININE 0.97 01/04/2014 0500   CALCIUM 8.4 01/04/2014 0500   PROT 6.4 12/29/2013 0109   ALBUMIN 3.4* 12/29/2013 0109   AST 13 12/29/2013 0109   ALT 7 12/29/2013 0109   ALKPHOS 61 12/29/2013 0109   BILITOT 0.7 12/29/2013 0109   GFRNONAA 68* 01/04/2014 0500   GFRAA 79* 01/04/2014 0500    Assessment and Plan: 1. Code Status: DNR 2. Symptom Control:  1. Anxiety/Agitation: Lorazepam prn.  2. Pain: Voltaren gel to right knee.  3. Bowel Regimen: Miralax prn.  4. Fever: Acetaminophen prn.  5. Nausea/Vomiting:  Ondansetron prn.  3. Psycho/Social: Emotional support provided to patient and family.  4. Disposition: SNF rehab with palliative to follow.   Patient Documents Completed or Given: Document Given Completed  Advanced Directives Pkt    MOST  yes  DNR  yes  Gone from [facility]    Hard Choices      Time In Time Out Total Time Spent with Patient Total Overall Time  1015 1100 22mn 436m    Greater than 50%  of this time was spent counseling and coordinating care related to the above assessment and plan.  AlVinie SillNP Palliative Medicine Team Pager # [phone]-F [phone]) Team Phone # [phone]/Weekends)   1

## 2014-01-05 DIAGNOSIS — I4891 Unspecified atrial fibrillation: Secondary | ICD-10-CM

## 2014-01-05 LAB — BASIC METABOLIC PANEL
ANION GAP: 11 (ref 5–15)
BUN: 10 mg/dL (ref 6–23)
CO2: 27 meq/L (ref 19–32)
CREATININE: 0.96 mg/dL (ref 0.50–1.35)
Calcium: 8 mg/dL — ABNORMAL LOW (ref 8.4–10.5)
Chloride: 102 mEq/L (ref 96–112)
GFR calc non Af Amer: 69 mL/min — ABNORMAL LOW (ref >90)
GFR, EST AFRICAN AMERICAN: 80 mL/min — AB (ref >90)
Glucose, Bld: 144 mg/dL — ABNORMAL HIGH (ref 70–99)
POTASSIUM: 3.3 meq/L — AB (ref 3.7–5.3)
SODIUM: 140 meq/L (ref 137–147)

## 2014-01-05 LAB — CBC
HEMATOCRIT: 25.7 % — AB (ref 39.0–52.0)
HEMOGLOBIN: 8.3 g/dL — AB (ref 13.0–17.0)
MCH: 28.5 pg (ref 26.0–34.0)
MCHC: 32.3 g/dL (ref 30.0–36.0)
MCV: 88.3 fL (ref 78.0–100.0)
Platelets: 246 10*3/uL (ref 150–400)
RBC: 2.91 MIL/uL — AB (ref 4.22–5.81)
RDW: 14.3 % (ref 11.5–15.5)
WBC: 16.2 10*3/uL — AB (ref 4.0–10.5)

## 2014-01-05 LAB — GLUCOSE, CAPILLARY
GLUCOSE-CAPILLARY: 154 mg/dL — AB (ref 70–99)
Glucose-Capillary: 208 mg/dL — ABNORMAL HIGH (ref 70–99)

## 2014-01-05 MED ORDER — ACETAMINOPHEN 325 MG PO TABS: 650.0000 mg | ORAL_TABLET | Freq: Four times a day (QID) | ORAL | Status: AC | PRN

## 2014-01-05 MED ORDER — FUROSEMIDE 20 MG PO TABS: 20.0000 mg | ORAL_TABLET | Freq: Two times a day (BID) | ORAL | Status: AC

## 2014-01-05 MED ORDER — FINASTERIDE 5 MG PO TABS: 5.0000 mg | ORAL_TABLET | Freq: Every day | ORAL | Status: AC

## 2014-01-05 MED ORDER — AMOXICILLIN-POT CLAVULANATE 875-125 MG PO TABS
1.0000 | ORAL_TABLET | Freq: Two times a day (BID) | ORAL | Status: DC
  Administered 2014-01-05: 1 via ORAL
  Filled 2014-01-05 (×2): qty 1

## 2014-01-05 MED ORDER — POTASSIUM CHLORIDE CRYS ER 20 MEQ PO TBCR
40.0000 meq | EXTENDED_RELEASE_TABLET | Freq: Four times a day (QID) | ORAL | Status: DC
  Administered 2014-01-05: 40 meq via ORAL
  Filled 2014-01-05 (×2): qty 2

## 2014-01-05 MED ORDER — LORAZEPAM 0.5 MG PO TABS: 0.5000 mg | ORAL_TABLET | Freq: Every day | ORAL | Status: DC

## 2014-01-05 MED ORDER — AMOXICILLIN-POT CLAVULANATE 875-125 MG PO TABS: 1.0000 | ORAL_TABLET | Freq: Two times a day (BID) | ORAL | Status: DC

## 2014-01-05 NOTE — Progress Notes (Signed)
Clinical Social Work  [facility]. Patient and family notified and happy that patient will be at SNF he is familiar with. Patient and family request [facility] to provide transportation at DC. CSW prepared DC packet with FL2, DNR, MOST form, DC summary and hard scripts included. [facility] request #: [id number].  CSW is signing off but available if needed.  [place]apahHolly Kennethia Lynes, [place] [phone]

## 2014-01-05 NOTE — Progress Notes (Signed)
Gave report to Ramactou, [facility]. Left number if she had additional questions.

## 2014-01-05 NOTE — Discharge Summary (Signed)
Physician Discharge Summary  Tyge Somers ZOX:[id number] DOB: Aug 21, 1916 DOA: 12/28/2013  PCP: Karle Plumber, MD  Admit date: 12/28/2013 Discharge date: 01/05/2014  Time spent: 35 minutes  Recommendations for Outpatient Follow-up:  1. Please continue Foley Catheter with exchange every 4 weeks at his SNF. Will need outpatient follow up with Dr Lynnae Sandhoff of Urology 2. Repeat BMP in 3-4 days to follow up on kidney function and potassium level. He was hypokalemic during this hospitalization 3. Repeat CBC in 3-4 days 4. Augmentin therpy anticipated stop date 01/09/2014  Discharge Diagnoses:  Active Problems:   Diabetes mellitus type 2 with complications   CAD (coronary artery disease), native coronary artery   Hypothyroidism (acquired)   Atrial fibrillation with controlled ventricular response   Sepsis   Palliative care encounter   Discharge Condition: Stable/Improved  Diet recommendation: Speech/Path recommending regular diet with thin liquids, no straw  Filed Weights   01/01/14 0400 01/01/14 1212 01/01/14 1219  Weight: 78.1 kg (172 lb 2.9 oz) 80.6 kg (177 lb 11.1 oz) 80.5 kg (177 lb 7.5 oz)    History of present illness:  Chales Morton is a 80 y.o. male with h/o hypertension, DM, afib, CHF, came in yesterday to ED on 12/29/2013, for acute urinary retention and hematuria yesterday, underwent foley cathter placement and was discharged back to the facility with an antibiotic. He presented later in the morning with gross hematuria, . On arrival to ED, he was found to be febrile, hypotension, tachycardic. He was referred to medical service for admission.    Hospital Course:  Patient is a 80 year old male with a PMH of DM, HTN, Hypothyroidism, AFIB, CHF, COPD who was seen in ER on 6/25 for urinary retention, hematuria & UTI. A catheter was inserted, he then pt developed pain post placement. Findings discussed with Urology & was changed to a coude. Post change, urine cleared to 'cherry  lemonade' and discharged to ALF with keflex. He developed frank blood in urine overnight. Patient returned to [facility] ER on 6/26 with gross hematuria & hypotension. Catheter was inserted and irrigated with multiple clots returned. ER evaluation noted a 1 gm drop in Hgb, low blood pressure with readings in the 70's, increased WBC's and acute kidney injury. He was initially admitted to the Step Down Unit, started on IV antibiotic therapy and IV fluids. Blood pressures improved with IV fluid resuscitation. Urology was consulted, hematuria felt to be prostatic in origin as he was started on CBI. Hospitalization was complicated by fluid overload and acute CHF that likely resulted from IV fluid administration. He was diuresed with lasix. Urine cultures grew 60,000 multiple morphotypes. Palliative Care was consulted during this hospitalization to affirm goals of care. He showed clinical improvement and by 01/05/2014 he was awake and alert, feeding himself, and appeared well enough to be transferred to SNF.    Sepsis secondary to UTI:  -Clinically improving  -Evidenced by white count of 21,600, acute encephalopathy, HR of 106, RR of 25.  -Discharging on 4 days of Augmentin Fever, leukocytosis, hypotension:  - Secondary to sepsis from UTI. Urine cultures grew 60,000 multiple bacterial morphotypes.  -he was started on rocephin , later on changed to zosyn to cover aspiration pneumonia.  -Improved  Dysphagia  -There were concerns with aspiration  -He presented with a functional decline having acute encephalopathy  -Speech/path consulted found to have mild-moderate multi-factorial dysphagia with oropharyngeal and esophageal components  -Diet advanced to regular diet Hematuria:  - Possibly traumatic. Foley catheter left in place -  His urine has cleared up. Will need outpatient follow up with Urology Acute Anemia of Blood loss:  -possibly from the hematuria.  - Hg 8.3 on the day of discharge Hypokalemia  -Replaced  with Kdur . -Follow up on BMP in 3-4 days    Consultations:  Urology  PCCM  Palliative Care  Discharge Exam: Filed Vitals:   01/05/14 0548  BP: 131/74  Pulse: 62  Temp: 98.1 F (36.7 C)  Resp: 18   General: awake and alert, oriented to person, place and time. Sitting up eating, in no acute distress Cardiovascular:  Normal heart sounds, no edema Respiratory: Normal inspiratory effort, lungs clear  Abdomen: soft NT ND BS+  Musculoskeletal: no pedal edema.     Discharge Instructions You were cared for by a hospitalist during your hospital stay. If you have any questions about your discharge medications or the care you received while you were in the hospital after you are discharged, you can call the unit and asked to speak with the hospitalist on call if the hospitalist that took care of you is not available. Once you are discharged, your primary care physician will handle any further medical issues. Please note that NO REFILLS for any discharge medications will be authorized once you are discharged, as it is imperative that you return to your primary care physician (or establish a relationship with a primary care physician if you do not have one) for your aftercare needs so that they can reassess your need for medications and monitor your lab values.  Discharge Instructions   Call MD for:  difficulty breathing, headache or visual disturbances    Complete by:  As directed      Call MD for:  extreme fatigue    Complete by:  As directed      Call MD for:  persistant dizziness or light-headedness    Complete by:  As directed      Call MD for:  persistant nausea and vomiting    Complete by:  As directed      Call MD for:  severe uncontrolled pain    Complete by:  As directed      Call MD for:  temperature >100.4    Complete by:  As directed      Diet - low sodium heart healthy    Complete by:  As directed      Increase activity slowly    Complete by:  As directed              Medication List    STOP taking these medications       cephALEXin 500 MG capsule  Commonly known as:  KEFLEX     glimepiride 2 MG tablet  Commonly known as:  AMARYL     isosorbide mononitrate 60 MG 24 hr tablet  Commonly known as:  IMDUR     lisinopril 2.5 MG tablet  Commonly known as:  ZESTRIL     OVER THE COUNTER MEDICATION     oxybutynin 5 MG tablet  Commonly known as:  DITROPAN      TAKE these medications       acetaminophen 325 MG tablet  Commonly known as:  TYLENOL  Take 2 tablets (650 mg total) by mouth every 6 (six) hours as needed for mild pain, moderate pain, fever or headache.     albuterol (2.5 MG/3ML) 0.083% nebulizer solution  Commonly known as:  PROVENTIL  Take 2.5 mg by nebulization every 6 (six) hours  as needed for wheezing or shortness of breath.     amoxicillin-clavulanate 875-125 MG per tablet  Commonly known as:  AUGMENTIN  Take 1 tablet by mouth every 12 (twelve) hours.     aspirin EC 81 MG tablet  Take 81 mg by mouth daily.     clopidogrel 75 MG tablet  Commonly known as:  PLAVIX  Take 75 mg by mouth daily.     finasteride 5 MG tablet  Commonly known as:  PROSCAR  Take 1 tablet (5 mg total) by mouth daily.     furosemide 20 MG tablet  Commonly known as:  LASIX  Take 1 tablet (20 mg total) by mouth 2 (two) times daily.     insulin glargine 100 UNIT/ML injection  Commonly known as:  LANTUS  Inject 0.1 mLs (10 Units total) into the skin at bedtime.     JOINTFLEX 3.1 % Crea  Generic drug:  Camphor  Apply 1 application topically 2 (two) times daily as needed (pain).     levothyroxine 50 MCG tablet  Commonly known as:  SYNTHROID, LEVOTHROID  Take 50 mcg by mouth daily before breakfast.     LORazepam 0.5 MG tablet  Commonly known as:  ATIVAN  Take 1 tablet (0.5 mg total) by mouth at bedtime.     lovastatin 20 MG tablet  Commonly known as:  MEVACOR  Take 20 mg by mouth at bedtime.     polyethylene glycol packet  Commonly  known as:  MIRALAX / GLYCOLAX  Take 17 g by mouth 2 (two) times daily as needed for mild constipation.     potassium chloride SA 20 MEQ tablet  Commonly known as:  K-DUR,KLOR-CON  Take 20 mEq by mouth daily.     senna 8.6 MG Tabs tablet  Commonly known as:  SENOKOT  Take 1 tablet by mouth at bedtime as needed for mild constipation.     surgical lubricant gel  Apply 1 application topically 3 (three) times daily. Apply to tip of penis for irritation or discomfort     SYSTANE OP  Apply 1-2 drops to eye daily as needed (dry eyes).       Allergies  Allergen Reactions  . Codeine Other (See Comments)    unknown  . Demerol [Meperidine] Other (See Comments)     Unknown per pt  mar  . Diltiazem Other (See Comments)     Unknown per pt  mar  . Penicillins Other (See Comments)     Unknown per pt  mar  . Sulfa Drugs Cross Reactors Hives    Per [facility]       Follow-up Information   Follow up with Karle Plumber, MD In 2 weeks.   Specialty:  Internal Medicine   Contact information:   [phone] [place] [address]       Follow up with DAHLSTEDT, Bertram Millard, MD In 2 weeks.   Specialty:  Urology   Contact information:   [address] [place] [address] [phone]        The results of significant diagnostics from this hospitalization (including imaging, microbiology, ancillary and laboratory) are listed below for reference.    Significant Diagnostic Studies: Dg Chest 2 View  01/01/2014   CLINICAL DATA:  Short of breath  EXAM: CHEST  2 VIEW  COMPARISON:  12/30/2013  FINDINGS: There is moderate cardiac enlargement. There are moderate bilateral pleural effusions. The interstitial edema pattern appears improved from previous exam.  IMPRESSION:  1. Interval decrease in pulmonary edema.   Electronically Signed   By: Signa Kellaylor  Stroud M.D.   On: 01/01/2014 18:09   Dg Esophagus  01/02/2014   CLINICAL DATA:  Dysphagia.  Cough.  EXAM: ESOPHOGRAM/BARIUM SWALLOW  TECHNIQUE:  Single contrast examination was performed using  thin barium.  FLUOROSCOPY TIME:  2 min and 33 seconds  COMPARISON:  None.  FINDINGS: Limited piece meal swallowing. No esophageal mass or obstruction. There is a small hiatal hernia and a widely patent mucosal ring distally. No GE reflux was demonstrated. The patient did aspirate when sipping water. Modified barium swallow may be helpful for further evaluation.  IMPRESSION: Small hiatal hernia and widely patent mucosal ring.  Aspiration when sipping water.  Recommend speech evaluation.  No esophageal mass or stricture.   Electronically Signed   By: Loralie ChampagneMark  Gallerani M.D.   On: 01/02/2014 13:40   [place] Renal  12/29/2013   CLINICAL DATA:  Acute renal failure, hematuria  EXAM: RENAL/URINARY TRACT ULTRASOUND COMPLETE  COMPARISON:  None.  FINDINGS: Right Kidney:  Length: 11.2 cm. Echogenicity within normal limits. No mass or hydronephrosis visualized. Shadowing hyperechoic 10 mm focus in the right kidney consistent with nephrolithiasis.  Left Kidney:  Length: 11.7 cm. Echogenicity within normal limits. No hydronephrosis visualized. Multiple anechoic left renal mass is with the largest measuring 3.9 x 3.3 x 3.8 cm most consistent with cysts.  Bladder:  Foley catheter within the bladder. There is a hypoechoic multi septated mass surrounding the Foley catheter likely representing a hematoma.  IMPRESSION: 1. Nonobstructing right nephrolithiasis. 2. Left renal cysts. 3. Hypo: Multi-septated mass around the Foley catheter within the bladder likely representing a hematoma.   Electronically Signed   By: Elige KoHetal  Patel   On: 12/29/2013 11:15   Dg Pelvis Portable  12/28/2013   CLINICAL DATA:  Hematuria  EXAM: PORTABLE PELVIS 1-2 VIEWS  COMPARISON:  None.  FINDINGS: Pelvic structures are within normal limits. By history there is an indwelling Foley catheter although this is not well visualized on this exam. If necessary contrast injection could be performed to assess placement.  Degenerative changes of the lumbar spine are seen.  IMPRESSION: Nonspecific pelvis. If clinically indicated injection in the Foley catheter could be performed to assess for placement. It is not well visualized on this exam.   Electronically Signed   By: Alcide CleverMark  Lukens M.D.   On: 12/28/2013 08:38   Dg Chest Port 1 View  12/30/2013   CLINICAL DATA:  Cough.  Congestion.  Hypertension.  Diabetes.  EXAM: PORTABLE CHEST - 1 VIEW  COMPARISON:  12/29/2013  FINDINGS: Bilateral glenohumeral joint osteoarthritis. Patient rotated left. Cardiomegaly accentuated by AP portable technique. Probable small bilateral pleural effusions. No pneumothorax. Worsening interstitial prominence and indistinctness. Left greater than right patchy airspace disease is new or progressive.  IMPRESSION: Worsening congestive heart failure, moderate.  Probable small bilateral pleural effusions with adjacent developing atelectasis or infection.   Electronically Signed   By: Jeronimo GreavesKyle  Talbot M.D.   On: 12/30/2013 16:30   Dg Chest Port 1 View  12/29/2013   CLINICAL DATA:  Hoarse voice  EXAM: PORTABLE CHEST - 1 VIEW  COMPARISON:  08/07/2013  FINDINGS: Bilateral diffuse interstitial thickening likely chronic. There is no focal parenchymal opacity, pleural effusion, or pneumothorax. The heart and mediastinal contours are unremarkable.  Osteoarthritis of bilateral glenohumeral joints.  IMPRESSION: Diffuse bilateral interstitial thickening likely chronic. Superimposed mild interstitial edema or atypical infection cannot be excluded.   Electronically Signed   By:  Elige Ko   On: 12/29/2013 14:22   Dg Swallowing Func-speech Pathology  01/04/2014   Vivi Ferns McCoy, CCC-SLP     01/04/2014  9:47 AM Objective Swallowing Evaluation: Modified Barium Swallowing Study   Patient Details  Name: Carlos Morton MRN: [id number] Date of Birth: 06-May-1917  Today's Date: 01/04/2014 Time: 0840-0900 SLP Time Calculation (min): 20 min  Past Medical History:  Past Medical History   Diagnosis Date  . Hypertension   . Diabetes mellitus   . Thyroid disease   . High cholesterol   . Atrial fibrillation   . CHF (congestive heart failure)   . Emphysema    Past Surgical History:  Past Surgical History  Procedure Laterality Date  . Carotid stent      on blood thinner for stent  . Replacement total knee      left knee  . Appendectomy     HPI:  Pt adm to [facility] 12/28/13 with sepsis. PMH + for CHF, hematuria.  Pt  reports recent difficulty swallowing, lack of appetite, breathing  difficulties and "laryngitis".  CXR 6/27 showed worsening CHF,  moderate bilateral pleural effusion ? ATX or infection, repeat  CXR 6/29 showed decrease in edema.   BSE completed with concern  for primary esophageal deficits, barium swallow completed with  aspiration of water, small hiatal hernia. MBS recommended to be  considered and was ordered.     Assessment / Plan / Recommendation Clinical Impression  Dysphagia Diagnosis: Moderate oral phase dysphagia;Moderate  pharyngeal phase dysphagia (esophageal component) Clinical impression: Patient presents with a mild-moderate  multi-factorial dysphagia with oropharyngeal and esophageal  components. Timing  of swallow functional however mild BOT,  laryngeal, and pharyngeal weakness as well as known mild  esophageal deficits results in mild-moderate pharyngeal residuals  post swallow which lead to infrequent trace penetration of  liquids which clear quickly with spontaneous dry swallows.  Additionally, these dry swallows aid in clearance of pharynx,  decreasing risk of aspiration overall. Patient impulsive. SLP  providing moderate verbal and tactile cueing for small controlled  single sips to additionally decrease episodes of penetration,  decreasing aspiration risk. Education complete with patient and  family who were present for study regarding results and  recommendations. Although no aspiration noted during today's  exam, combination of suspected mild dysphagia at baseline with  mild  esophageal impairments, respiratory deficits, and AMS do  increase risk. Episodic aspiration possible however at this time,    modifications to diet are likely not to be of great impact.     Treatment Recommendation  Therapy as outlined in treatment plan below    Diet Recommendation Regular;Thin liquid   Liquid Administration via: Cup;No straw Medication Administration: Whole meds with liquid Supervision: Patient able to self feed;Full supervision/cueing  for compensatory strategies Compensations: Slow rate;Small sips/bites;Multiple dry swallows  after each bite/sip;Follow solids with liquid Postural Changes and/or Swallow Maneuvers: Seated upright 90  degrees;Upright 30-60 min after meal    Other  Recommendations Oral Care Recommendations: Oral care BID   Follow Up Recommendations  Skilled Nursing facility    Frequency and Duration min 2x/week  1 week           General Date of Onset: 01/01/14 HPI: Pt adm to [facility] 12/28/13 with sepsis. PMH + for CHF, hematuria.   Pt reports recent difficulty swallowing, lack of appetite,  breathing difficulties and "laryngitis".  CXR 6/27 showed  worsening CHF, moderate bilateral pleural effusion ? ATX or  infection, repeat CXR 6/29 showed  decrease in edema.   BSE  completed with concern for primary esophageal deficits, barium  swallow completed with aspiration of water, small hiatal hernia.  MBS recommended to be considered and was ordered. Type of Study: Modified Barium Swallowing Study Reason for Referral: Objectively evaluate swallowing function Previous Swallow Assessment: see BSE Diet Prior to this Study: NPO (except ice chips) Temperature Spikes Noted: No Respiratory Status: Nasal cannula History of Recent Intubation: No Behavior/Cognition: Alert;Cooperative;Pleasant mood Oral Cavity - Dentition: Adequate natural dentition Oral Motor / Sensory Function: Impaired - see Bedside swallow  eval Self-Feeding Abilities: Able to feed self Patient Positioning: Upright in chair Baseline  Vocal Quality: Hoarse;Breathy Volitional Cough: Weak Volitional Swallow: Able to elicit Anatomy: Within functional limits Pharyngeal Secretions: Not observed secondary MBS    Reason for Referral Objectively evaluate swallowing function   Oral Phase Oral Preparation/Oral Phase Oral Phase: WFL   Pharyngeal Phase Pharyngeal Phase Pharyngeal Phase: Impaired Pharyngeal - Nectar Pharyngeal - Nectar Teaspoon: Reduced tongue base  retraction;Reduced pharyngeal peristalsis;Reduced anterior  laryngeal mobility;Reduced laryngeal elevation;Pharyngeal residue  - valleculae;Pharyngeal residue - pyriform sinuses Pharyngeal - Thin Pharyngeal - Thin Teaspoon: Reduced tongue base  retraction;Reduced pharyngeal peristalsis;Reduced anterior  laryngeal mobility;Reduced laryngeal elevation;Pharyngeal residue  - valleculae;Pharyngeal residue - pyriform sinuses Pharyngeal - Thin Cup: Reduced tongue base retraction;Reduced  pharyngeal peristalsis;Reduced anterior laryngeal  mobility;Reduced laryngeal elevation;Pharyngeal residue -  valleculae;Pharyngeal residue - pyriform  sinuses;Penetration/Aspiration after swallow Penetration/Aspiration details (thin cup): Material enters  airway, remains ABOVE vocal cords and not ejected out (clears  with spontaneous dry swallows) Pharyngeal - Solids Pharyngeal - Puree: Delayed swallow initiation;Premature spillage  to valleculae;Pharyngeal residue - valleculae Pharyngeal - Mechanical Soft: Delayed swallow  initiation;Premature spillage to valleculae;Pharyngeal residue -  valleculae Pharyngeal - Pill: Delayed swallow initiation;Premature spillage  to valleculae;Pharyngeal residue - pyriform sinuses  Cervical Esophageal Phase    GO   Leah McCoy MA, CCC-SLP [phone]  Cervical Esophageal Phase Cervical Esophageal Phase: Impaired Cervical Esophageal Phase - Comment Cervical Esophageal Comment: decreased UES relaxation, functional  however         McCoy Leah Meryl 01/04/2014, 9:46 AM      Microbiology: Recent Results (from the past 240 hour(s))  URINE CULTURE     Status: None   Collection Time    12/28/13  6:36 AM      Result Value Ref Range Status   Specimen Description URINE, RANDOM   Final   Special Requests NONE   Final   Culture  Setup Time     Final   Value: 12/28/2013 12:43     Performed at [facility] Count     Final   Value: 60,000 COLONIES/ML     Performed at [facility]   Culture     Final   Value: Multiple bacterial morphotypes present, none predominant. Suggest appropriate recollection if clinically indicated.     Performed at [facility]   Report Status 12/29/2013 FINAL   Final  MRSA PCR SCREENING     Status: None   Collection Time    12/29/13  8:53 AM      Result Value Ref Range Status   MRSA by PCR NEGATIVE  NEGATIVE Final   Comment:            The GeneXpert MRSA Assay (FDA     approved for NASAL specimens     only), is one component of a     comprehensive MRSA colonization     surveillance  program. It is not     intended to diagnose MRSA     infection nor to guide or     monitor treatment for     MRSA infections.  CULTURE, BLOOD (ROUTINE X 2)     Status: None   Collection Time    12/29/13  6:00 PM      Result Value Ref Range Status   Specimen Description BLOOD LEFT HAND   Final   Special Requests BOTTLES DRAWN AEROBIC AND ANAEROBIC 10CC   Final   Culture  Setup Time     Final   Value: 12/29/2013 22:56     Performed at [facility]   Culture     Final   Value: NO GROWTH 5 DAYS     Performed at [facility]   Report Status 01/04/2014 FINAL   Final  CULTURE, BLOOD (ROUTINE X 2)     Status: None   Collection Time    12/29/13  6:20 PM      Result Value Ref Range Status   Specimen Description BLOOD LEFT ARM   Final   Special Requests BOTTLES DRAWN AEROBIC AND ANAEROBIC 10CC   Final   Culture  Setup Time     Final   Value: 12/29/2013 22:57     Performed at [facility]    Culture     Final   Value: NO GROWTH 5 DAYS     Performed at [facility]   Report Status 01/04/2014 FINAL   Final     Labs: Basic Metabolic Panel:  Recent Labs Lab 01/01/14 0410 01/02/14 0600 01/03/14 0530 01/04/14 0500 01/05/14 0513  NA 139 140 144 142 140  K 3.3* 3.5* 3.3* 3.4* 3.3*  CL 105 103 105 104 102  CO2 22 24 27 26 27   GLUCOSE 73 54* 119* 163* 144*  BUN 21 18 15 14 10   CREATININE 1.00 1.05 1.10 0.97 0.96  CALCIUM 8.0* 8.5 8.0* 8.4 8.0*  MG  --  1.7  --   --   --    Liver Function Tests: No results found for this basename: AST, ALT, ALKPHOS, BILITOT, PROT, ALBUMIN,  in the last 168 hours No results found for this basename: LIPASE, AMYLASE,  in the last 168 hours No results found for this basename: AMMONIA,  in the last 168 hours CBC:  Recent Labs Lab 12/30/13 1039 01/01/14 0410 01/02/14 0600 01/04/14 0500 01/05/14 0513  WBC 21.6* 15.6* 19.4* 17.6* 16.2*  HGB 8.6* 8.1* 8.6* 9.2* 8.3*  HCT 25.4* 23.8* 25.2* 28.4* 25.7*  MCV 87.9 86.5 86.0 88.2 88.3  PLT 116* 124* 195 242 246   Cardiac Enzymes: No results found for this basename: CKTOTAL, CKMB, CKMBINDEX, TROPONINI,  in the last 168 hours BNP: BNP (last 3 results)  Recent Labs  06/21/13 1750 06/22/13 0330  PROBNP 2179.0* 2124.0*   CBG:  Recent Labs Lab 01/04/14 0748 01/04/14 1140 01/04/14 1807 01/04/14 2110 01/05/14 0806  GLUCAP 166* 180* 139* 146* 154*       Signed:  Dinesha Twiggs  [facility] 01/05/2014, 9:45 AM

## 2014-01-07 NOTE — Consult Note (Signed)
I have reviewed and discussed the care of this patient in detail with the nurse practitioner including pertinent patient records, physical exam findings and data. I agree with details of this encounter.  

## 2014-01-08 MED ORDER — LORAZEPAM 2 MG/ML IJ SOLN: 1.0000 mg | Freq: Once | INTRAMUSCULAR | Status: AC

## 2017-06-21 ENCOUNTER — Inpatient Hospital Stay (HOSPITAL_COMMUNITY)
Admission: EM | Admit: 2017-06-21 | Discharge: 2017-06-25 | DRG: 871 | Disposition: A | Discharge status: Home Health | Payer: Medicare Other | Attending: Internal Medicine | Admitting: Internal Medicine

## 2017-06-21 ENCOUNTER — Emergency Department (HOSPITAL_COMMUNITY): Payer: Medicare Other

## 2017-06-21 ENCOUNTER — Encounter (HOSPITAL_COMMUNITY): Payer: Self-pay

## 2017-06-21 ENCOUNTER — Other Ambulatory Visit: Payer: Self-pay

## 2017-06-21 DIAGNOSIS — T83511A Infection and inflammatory reaction due to indwelling urethral catheter, initial encounter: Secondary | ICD-10-CM

## 2017-06-21 DIAGNOSIS — J449 Chronic obstructive pulmonary disease, unspecified: Secondary | ICD-10-CM | POA: Diagnosis not present

## 2017-06-21 DIAGNOSIS — J431 Panlobular emphysema: Secondary | ICD-10-CM | POA: Diagnosis not present

## 2017-06-21 DIAGNOSIS — Z79890 Hormone replacement therapy (postmenopausal): Secondary | ICD-10-CM

## 2017-06-21 DIAGNOSIS — Z885 Allergy status to narcotic agent status: Secondary | ICD-10-CM

## 2017-06-21 DIAGNOSIS — J189 Pneumonia, unspecified organism: Secondary | ICD-10-CM | POA: Diagnosis not present

## 2017-06-21 DIAGNOSIS — Z794 Long term (current) use of insulin: Secondary | ICD-10-CM

## 2017-06-21 DIAGNOSIS — A419 Sepsis, unspecified organism: Secondary | ICD-10-CM | POA: Diagnosis not present

## 2017-06-21 DIAGNOSIS — Z79899 Other long term (current) drug therapy: Secondary | ICD-10-CM

## 2017-06-21 DIAGNOSIS — I110 Hypertensive heart disease with heart failure: Secondary | ICD-10-CM | POA: Diagnosis present

## 2017-06-21 DIAGNOSIS — Z7982 Long term (current) use of aspirin: Secondary | ICD-10-CM

## 2017-06-21 DIAGNOSIS — I5022 Chronic systolic (congestive) heart failure: Secondary | ICD-10-CM | POA: Diagnosis not present

## 2017-06-21 DIAGNOSIS — Y95 Nosocomial condition: Secondary | ICD-10-CM | POA: Diagnosis present

## 2017-06-21 DIAGNOSIS — N179 Acute kidney failure, unspecified: Secondary | ICD-10-CM | POA: Diagnosis present

## 2017-06-21 DIAGNOSIS — R05 Cough: Secondary | ICD-10-CM | POA: Diagnosis not present

## 2017-06-21 DIAGNOSIS — R059 Cough, unspecified: Secondary | ICD-10-CM | POA: Diagnosis present

## 2017-06-21 DIAGNOSIS — R6520 Severe sepsis without septic shock: Secondary | ICD-10-CM | POA: Diagnosis present

## 2017-06-21 DIAGNOSIS — R6510 Systemic inflammatory response syndrome (SIRS) of non-infectious origin without acute organ dysfunction: Secondary | ICD-10-CM | POA: Diagnosis present

## 2017-06-21 DIAGNOSIS — D72829 Elevated white blood cell count, unspecified: Secondary | ICD-10-CM

## 2017-06-21 DIAGNOSIS — E119 Type 2 diabetes mellitus without complications: Secondary | ICD-10-CM

## 2017-06-21 DIAGNOSIS — T445X5A Adverse effect of predominantly beta-adrenoreceptor agonists, initial encounter: Secondary | ICD-10-CM | POA: Diagnosis present

## 2017-06-21 DIAGNOSIS — J9610 Chronic respiratory failure, unspecified whether with hypoxia or hypercapnia: Secondary | ICD-10-CM | POA: Diagnosis present

## 2017-06-21 DIAGNOSIS — Z882 Allergy status to sulfonamides status: Secondary | ICD-10-CM

## 2017-06-21 DIAGNOSIS — Z66 Do not resuscitate: Secondary | ICD-10-CM | POA: Diagnosis present

## 2017-06-21 DIAGNOSIS — J440 Chronic obstructive pulmonary disease with acute lower respiratory infection: Secondary | ICD-10-CM | POA: Diagnosis present

## 2017-06-21 DIAGNOSIS — E7800 Pure hypercholesterolemia, unspecified: Secondary | ICD-10-CM | POA: Diagnosis present

## 2017-06-21 DIAGNOSIS — Z87891 Personal history of nicotine dependence: Secondary | ICD-10-CM

## 2017-06-21 DIAGNOSIS — Z9981 Dependence on supplemental oxygen: Secondary | ICD-10-CM

## 2017-06-21 DIAGNOSIS — Z7952 Long term (current) use of systemic steroids: Secondary | ICD-10-CM

## 2017-06-21 DIAGNOSIS — Z9181 History of falling: Secondary | ICD-10-CM

## 2017-06-21 DIAGNOSIS — Z96659 Presence of unspecified artificial knee joint: Secondary | ICD-10-CM | POA: Diagnosis present

## 2017-06-21 DIAGNOSIS — I482 Chronic atrial fibrillation: Secondary | ICD-10-CM | POA: Diagnosis present

## 2017-06-21 DIAGNOSIS — Z880 Allergy status to penicillin: Secondary | ICD-10-CM

## 2017-06-21 DIAGNOSIS — N390 Urinary tract infection, site not specified: Secondary | ICD-10-CM

## 2017-06-21 LAB — COMPREHENSIVE METABOLIC PANEL
ALT: 9 U/L — ABNORMAL LOW (ref 17–63)
ANION GAP: 10 (ref 5–15)
AST: 19 U/L (ref 15–41)
Albumin: 3.4 g/dL — ABNORMAL LOW (ref 3.5–5.0)
Alkaline Phosphatase: 50 U/L (ref 38–126)
BUN: 41 mg/dL — ABNORMAL HIGH (ref 6–20)
CHLORIDE: 102 mmol/L (ref 101–111)
CO2: 23 mmol/L (ref 22–32)
Calcium: 9 mg/dL (ref 8.9–10.3)
Creatinine, Ser: 1.75 mg/dL — ABNORMAL HIGH (ref 0.61–1.24)
GFR, EST AFRICAN AMERICAN: 36 mL/min — AB (ref >60)
GFR, EST NON AFRICAN AMERICAN: 31 mL/min — AB (ref >60)
Glucose, Bld: 191 mg/dL — ABNORMAL HIGH (ref 65–99)
Potassium: 5 mmol/L (ref 3.5–5.1)
SODIUM: 135 mmol/L (ref 135–145)
TOTAL PROTEIN: 6.4 g/dL — AB (ref 6.5–8.1)
Total Bilirubin: 0.8 mg/dL (ref 0.3–1.2)

## 2017-06-21 LAB — CBC WITH DIFFERENTIAL/PLATELET
Basophils Absolute: 0 10*3/uL (ref 0.0–0.1)
Basophils Relative: 0 %
EOS ABS: 0.1 10*3/uL (ref 0.0–0.7)
EOS PCT: 0 %
HCT: 30.2 % — ABNORMAL LOW (ref 39.0–52.0)
Hemoglobin: 9.7 g/dL — ABNORMAL LOW (ref 13.0–17.0)
LYMPHS ABS: 0.7 10*3/uL (ref 0.7–4.0)
Lymphocytes Relative: 3 %
MCH: 29.8 pg (ref 26.0–34.0)
MCHC: 32.1 g/dL (ref 30.0–36.0)
MCV: 92.6 fL (ref 78.0–100.0)
MONO ABS: 1.1 10*3/uL — AB (ref 0.1–1.0)
MONOS PCT: 4 %
Neutro Abs: 21.9 10*3/uL — ABNORMAL HIGH (ref 1.7–7.7)
Neutrophils Relative %: 93 %
PLATELETS: 227 10*3/uL (ref 150–400)
RBC: 3.26 MIL/uL — AB (ref 4.22–5.81)
RDW: 14.3 % (ref 11.5–15.5)
WBC: 23.7 10*3/uL — AB (ref 4.0–10.5)

## 2017-06-21 LAB — URINALYSIS, ROUTINE W REFLEX MICROSCOPIC
Bilirubin Urine: NEGATIVE
Glucose, UA: NEGATIVE mg/dL
Ketones, ur: NEGATIVE mg/dL
Nitrite: NEGATIVE
PROTEIN: NEGATIVE mg/dL
SQUAMOUS EPITHELIAL / LPF: NONE SEEN
Specific Gravity, Urine: 1.01 (ref 1.005–1.030)
pH: 7 (ref 5.0–8.0)

## 2017-06-21 LAB — CBG MONITORING, ED: Glucose-Capillary: 212 mg/dL — ABNORMAL HIGH (ref 65–99)

## 2017-06-21 LAB — I-STAT TROPONIN, ED: TROPONIN I, POC: 0.03 ng/mL (ref 0.00–0.08)

## 2017-06-21 LAB — I-STAT CG4 LACTIC ACID, ED: LACTIC ACID, VENOUS: 3.24 mmol/L — AB (ref 0.5–1.9)

## 2017-06-21 LAB — AMMONIA: Ammonia: 17 umol/L (ref 9–35)

## 2017-06-21 MED ORDER — VANCOMYCIN HCL IN DEXTROSE 1-5 GM/200ML-% IV SOLN: 1000.0000 mg | Freq: Once | INTRAVENOUS | Status: DC

## 2017-06-21 MED ORDER — LEVOFLOXACIN IN D5W 750 MG/150ML IV SOLN: 750.0000 mg | Freq: Once | INTRAVENOUS | Status: DC

## 2017-06-21 MED ORDER — LEVOFLOXACIN IN D5W 750 MG/150ML IV SOLN
750.0000 mg | Freq: Once | INTRAVENOUS | Status: AC
  Administered 2017-06-21: 750 mg via INTRAVENOUS
  Filled 2017-06-21: qty 150

## 2017-06-21 MED ORDER — SODIUM CHLORIDE 0.9 % IV BOLUS (SEPSIS)
1000.0000 mL | Freq: Once | INTRAVENOUS | Status: AC
  Administered 2017-06-21: 1000 mL via INTRAVENOUS

## 2017-06-21 MED ORDER — DEXTROSE 5 % IV SOLN: 2.0000 g | Freq: Once | INTRAVENOUS | Status: DC

## 2017-06-21 MED ORDER — DEXTROSE 5 % IV SOLN
2.0000 g | Freq: Once | INTRAVENOUS | Status: AC
  Administered 2017-06-21: 2 g via INTRAVENOUS
  Filled 2017-06-21: qty 2

## 2017-06-21 MED ORDER — VANCOMYCIN HCL IN DEXTROSE 1-5 GM/200ML-% IV SOLN
1000.0000 mg | Freq: Once | INTRAVENOUS | Status: AC
  Administered 2017-06-21: 1000 mg via INTRAVENOUS
  Filled 2017-06-21: qty 200

## 2017-06-21 NOTE — ED Notes (Signed)
Both sets of blood cultures have been collected.

## 2017-06-21 NOTE — ED Triage Notes (Signed)
[facility] Per [facility] daughter states pt is altered. Recent tx for UTI and cough. Hx diabetes, CBG 273 with EMS.

## 2017-06-21 NOTE — ED Notes (Signed)
I gave critical I Stat CG4 result to PA Tiburcio PeaHarris

## 2017-06-21 NOTE — ED Provider Notes (Signed)
[facility]-EMERGENCY DEPT Provider Note   CSN: [id number] Arrival date & time: 06/21/17  1947     History   Chief Complaint Chief Complaint  Patient presents with  . Altered Mental Status  . Cough    HPI Carlos Morton is a 83 y.o. male who come to the ED for AMS. He was recently diagnosed with pneumonia with completed the course.  Patient states that he has been feeling much better since that time.  His daughter who was at bedside states that over the past 3 days he has been more lethargic and confused.  At baseline he is normally very cognitively aware and has no dementia.  She is concerned and had him brought here for evaluation.  He has had a continued productive cough.  He is on oxygen while awake at baseline but sleep with any.  The patient denies fevers, chills, abdominal pain.  HPI  Past Medical History:  Diagnosis Date  . Atrial fibrillation (HCC)   . CHF (congestive heart failure) (HCC)   . Diabetes mellitus   . Emphysema   . High cholesterol   . Hypertension   . Thyroid disease     Patient Active Problem List   Diagnosis Date Noted  . PNA (pneumonia) 06/22/2017  . Acute lower UTI 06/22/2017  . Leukocytosis 06/22/2017  . Cough 06/22/2017  . Chronic systolic CHF (congestive heart failure) (HCC) 06/22/2017  . COPD (chronic obstructive pulmonary disease) (HCC) 06/22/2017  . On home oxygen therapy 06/22/2017  . DNR (do not resuscitate) 06/22/2017  . Palliative care encounter 01/03/2014  . SIRS (systemic inflammatory response syndrome) (HCC) 12/29/2013  . CHF (congestive heart failure) (HCC) 06/21/2013  . Bradycardia 10/27/2011  . DM (diabetes mellitus), type 2 (HCC) 10/22/2011  . CHF exacerbation (HCC) 10/22/2011  . CAD (coronary artery disease), native coronary artery 10/22/2011  . COPD bronchitis 10/22/2011  . Hypothyroidism (acquired) 10/22/2011  . Atrial fibrillation with controlled ventricular response (HCC) 10/22/2011    Past  Surgical History:  Procedure Laterality Date  . APPENDECTOMY    . CAROTID STENT     on blood thinner for stent  . REPLACEMENT TOTAL KNEE     left knee       Home Medications    Prior to Admission medications   Medication Sig Start Date End Date Taking? Authorizing Provider  acetaminophen (TYLENOL) 325 MG tablet Take 2 tablets (650 mg total) by mouth every 6 (six) hours as needed for mild pain, moderate pain, fever or headache. Patient taking differently: Take 650 mg by mouth at bedtime.  01/05/14  Yes Jeralyn Bennett, MD  albuterol (PROVENTIL) (2.5 MG/3ML) 0.083% nebulizer solution Take 2.5 mg by nebulization every 6 (six) hours as needed for wheezing or shortness of breath.   Yes [provider]  allopurinol (ZYLOPRIM) 100 MG tablet Take 200 mg by mouth daily.   Yes [provider]  aspirin EC 81 MG tablet Take 81 mg by mouth daily.   Yes [provider]  Camphor (JOINTFLEX) 3.1 % CREA Apply 1 application topically 2 (two) times daily as needed (pain).   Yes [provider]  celecoxib (CELEBREX) 100 MG capsule Take 100 mg by mouth 2 (two) times daily.   Yes [provider]  ferrous sulfate 325 (65 FE) MG EC tablet Take 325 mg by mouth daily with breakfast.   Yes [provider]  finasteride (PROSCAR) 5 MG tablet Take 1 tablet (5 mg total) by mouth daily. 01/05/14  Yes Jeralyn BennettZamora, Ezequiel, MD  furosemide (LASIX) 20 MG tablet Take 1 tablet (20 mg total) by mouth 2 (two) times daily. 01/05/14  Yes Jeralyn BennettZamora, Ezequiel, MD  gabapentin (NEURONTIN) 100 MG capsule Take 100 mg by mouth at bedtime.   Yes [provider]  glipiZIDE (GLUCOTROL XL) 10 MG 24 hr tablet Take 10 mg by mouth daily with breakfast.   Yes [provider]  levothyroxine (SYNTHROID, LEVOTHROID) 125 MCG tablet Take 125 mcg by mouth daily before breakfast.   Yes [provider]  lisinopril (PRINIVIL,ZESTRIL) 2.5 MG tablet Take 2.5 mg by mouth daily.   Yes  [provider]  metFORMIN (GLUCOPHAGE) 1000 MG tablet Take 1,000 mg by mouth 2 (two) times daily with a meal.   Yes [provider]  Polyethyl Glycol-Propyl Glycol (SYSTANE OP) Apply 1-2 drops to eye daily as needed (dry eyes).   Yes [provider]  polyethylene glycol (MIRALAX / GLYCOLAX) packet Take 17 g by mouth 2 (two) times daily as needed for mild constipation.   Yes [provider]  potassium chloride SA (K-DUR,KLOR-CON) 20 MEQ tablet Take 20 mEq by mouth daily.   Yes [provider]  predniSONE (DELTASONE) 5 MG tablet Take 5 mg by mouth daily with breakfast.   Yes [provider]  senna (SENOKOT) 8.6 MG TABS tablet Take 1 tablet by mouth at bedtime.    Yes [provider]  traZODone (DESYREL) 50 MG tablet Take 50 mg by mouth at bedtime.   Yes [provider]  insulin glargine (LANTUS) 100 UNIT/ML injection Inject 0.1 mLs (10 Units total) into the skin at bedtime. Patient not taking: Reported on 06/21/2017 06/23/13   Jeralyn BennettZamora, Ezequiel, MD  LORazepam (ATIVAN) 0.5 MG tablet Take 1 tablet (0.5 mg total) by mouth at bedtime. Patient not taking: Reported on 06/21/2017 01/05/14   Jeralyn BennettZamora, Ezequiel, MD    Family History History reviewed. No pertinent family history.  Social History Social History   Tobacco Use  . Smoking status: Former Smoker    Packs/day: 3.00    Types: Cigarettes  . Smokeless tobacco: Never Used  Substance Use Topics  . Alcohol use: No  . Drug use: No     Allergies   Codeine; Demerol [meperidine]; Diltiazem; Penicillins; and Sulfa drugs cross reactors   Review of Systems Review of Systems  Ten systems reviewed and are negative for acute change, except as noted in the HPI.   Physical Exam Updated Vital Signs BP (!) 102/57 (BP Location: Right Arm)   Pulse 77   Temp 98.6 F (37 C)   Resp (!) 21   Ht 5\' 8"  (1.727 m)   Wt 66.2 kg (146 lb)   SpO2 99%   BMI 22.20 kg/m   Physical Exam   Constitutional: He appears well-developed and well-nourished. No distress.  HENT:  Head: Normocephalic and atraumatic.  Eyes: Conjunctivae and EOM are normal. Pupils are equal, round, and reactive to light. No scleral icterus.  Neck: Normal range of motion. Neck supple.  Cardiovascular: Normal rate, regular rhythm, normal heart sounds and intact distal pulses.  Pulmonary/Chest: Effort normal. No respiratory distress.  Productive cough, rhonchi throughout lung fields  Abdominal: Soft. He exhibits no distension. There is no tenderness.  Genitourinary: Penis normal.  Genitourinary Comments: Patient with indwelling Foley catheter.  There is dried blood around the catheter site no tenderness  Musculoskeletal: He exhibits no edema.  Neurological: He is alert.  Skin: Skin is warm and dry. He is not  diaphoretic.  Psychiatric: His behavior is normal.  Nursing note and vitals reviewed.    ED Treatments / Results  Labs (all labs ordered are listed, but only abnormal results are displayed) Labs Reviewed  CBC WITH DIFFERENTIAL/PLATELET - Abnormal; Notable for the following components:      Result Value   WBC 23.7 (*)    RBC 3.26 (*)    Hemoglobin 9.7 (*)    HCT 30.2 (*)    Neutro Abs 21.9 (*)    Monocytes Absolute 1.1 (*)    All other components within normal limits  COMPREHENSIVE METABOLIC PANEL - Abnormal; Notable for the following components:   Glucose, Bld 191 (*)    BUN 41 (*)    Creatinine, Ser 1.75 (*)    Total Protein 6.4 (*)    Albumin 3.4 (*)    ALT 9 (*)    GFR calc non Af Amer 31 (*)    GFR calc Af Amer 36 (*)    All other components within normal limits  URINALYSIS, ROUTINE W REFLEX MICROSCOPIC - Abnormal; Notable for the following components:   APPearance CLOUDY (*)    Hgb urine dipstick SMALL (*)    Leukocytes, UA MODERATE (*)    Bacteria, UA MANY (*)    All other components within normal limits  CBG MONITORING, ED - Abnormal; Notable for the following components:     Glucose-Capillary 212 (*)    All other components within normal limits  I-STAT CG4 LACTIC ACID, ED - Abnormal; Notable for the following components:   Lactic Acid, Venous 3.24 (*)    All other components within normal limits  CULTURE, BLOOD (ROUTINE X 2)  CULTURE, BLOOD (ROUTINE X 2)  AMMONIA  I-STAT TROPONIN, ED    EKG  EKG Interpretation None       Radiology Dg Chest 2 View  Result Date: 06/21/2017 CLINICAL DATA:  Productive cough EXAM: CHEST  2 VIEW COMPARISON:  01/01/2014 FINDINGS: Elevation of the left diaphragm. No large pleural effusion. Right greater than left lung base pulmonary infiltrates. Enlarged cardiomediastinal silhouette. No pneumothorax. IMPRESSION: 1. Right greater than left bilateral lung base infiltrates. Radiographic follow-up to resolution recommended 2. Mild cardiomegaly. Electronically Signed   By: Jasmine Pang M.D.   On: 06/21/2017 21:48   Ct Head Wo Contrast  Result Date: 06/21/2017 CLINICAL DATA:  Altered mental status EXAM: CT HEAD WITHOUT CONTRAST TECHNIQUE: Contiguous axial images were obtained from the base of the skull through the vertex without intravenous contrast. COMPARISON:  None. FINDINGS: Brain: Motion degradation. No gross acute territorial infarction, hemorrhage, or intracranial mass is visualized. Mild to moderate small vessel ischemic changes of the white matter. Moderate atrophy. Old lacunar infarct in the right white matter. Vascular: No hyperdense vessels.  Carotid artery calcification. Skull: Mastoid sclerosis and minimal fluid on the left.  No fracture Sinuses/Orbits: Moderate mucosal thickening in the maxillary, sphenoid and ethmoid sinuses. No acute orbital abnormality. Other: None IMPRESSION: 1. Motion limits the study 2. No definite CT evidence for acute intracranial abnormality. Atrophy and small vessel ischemic changes of the white matter Electronically Signed   By: Jasmine Pang M.D.   On: 06/21/2017 23:32     Procedures Procedures (including critical care time)  Medications Ordered in ED Medications  vancomycin (VANCOCIN) IVPB 1000 mg/200 mL premix (1,000 mg Intravenous New Bag/Given 06/21/17 2357)  levofloxacin (LEVAQUIN) IVPB 750 mg (750 mg Intravenous New Bag/Given 06/21/17 2313)  aztreonam (AZACTAM) 2 g in dextrose 5 % 50 mL IVPB (  0 g Intravenous Stopped 06/21/17 2319)  sodium chloride 0.9 % bolus 1,000 mL (1,000 mLs Intravenous New Bag/Given 06/21/17 2250)    And  sodium chloride 0.9 % bolus 1,000 mL (1,000 mLs Intravenous New Bag/Given 06/21/17 2314)     Initial Impression / Assessment and Plan / ED Course  I have reviewed the triage vital signs and the nursing notes.  Pertinent labs & imaging results that were available during my care of the patient were reviewed by me and considered in my medical decision making (see chart for details).  Clinical Course as of Jun 22 20  Mon Jun 21, 2017  2325 Creatinine: (!) 1.75 [AH]  2326 WBC: (!) 23.7 [AH]  2326 WBC, UA: TOO NUMEROUS TO COUNT [AH]  2326 Bacteria, UA: (!) MANY [AH]  2326 Lactic Acid, Venous: (!!) 3.24 [AH]    Clinical Course User Index [AH] Arthor CaptainHarris, Jazline Cumbee, PA-C    Patient with HAP and UTI Sepsis. Patient will be admitted. Started on azactam and vanc.  Fluids given. Cultures take. BP is trending down and will need close monitoring. His repeat lactate is pending.  Final Clinical Impressions(s) / ED Diagnoses   Final diagnoses:  Sepsis due to pneumonia [facility](HCC)  Urinary tract infection associated with indwelling urethral catheter, initial encounter [facility](HCC)  AKI (acute kidney injury) [facility](HCC)    ED Discharge Orders    None       Arthor CaptainHarris, Padraic Marinos, PA-C 06/22/17 0021    Linwood DibblesKnapp, Jon, MD 06/25/17 [phone]

## 2017-06-21 NOTE — ED Notes (Signed)
Bed: [id number] Expected date:  Expected time:  Means of arrival:  Comments: 83 yr old AMS

## 2017-06-21 NOTE — Progress Notes (Signed)
A consult was received from an ED physician for vancomycin, aztreonam, levofloxacin per pharmacy dosing.  The patient's profile has been reviewed for ht/wt/allergies/indication/available labs.   A one time order has been placed for Vancomycin 1gm iv x1, aztreonam 2gm iv x1, and levofloxacin 750mg  iv x1.  Further antibiotics/pharmacy consults should be ordered by admitting physician if indicated.                       Thank you, Aleene DavidsonGrimsley Jr, Kaydyn Chism Crowford 06/21/2017  10:09 PM

## 2017-06-21 NOTE — ED Notes (Signed)
Pt daughter Olegario MessierKathy is his contact. [phone].

## 2017-06-21 NOTE — H&P (Signed)
[facility] History and Physical  Carlos Morton BWL:[id number] DOB: 09/08/1916 DOA: 06/21/2017  Referring physician: Dr Tomi Bamberger PCP: Guadlupe Spanish, MD   Chief Complaint: AMS  HPI: Carlos Morton is a 83 y.o. male presented to ED from [facility] with altered mental status.  Pt was recently rx'd for UTI and cough. Has had progressive lethargy and confusion at the SNF.  No dementia at baseline.  Has hx DM.  Presenting now to ED with WBC 23k , pyuria and confusion. Has had a persistent cough.  CXR showed focal infiltrates at the R base > L base.  He has rec'd IV abx for PNA and UTI . Asked to see for admission.   Patient has been sick for a few days with coughing, +prod cough with green sputum per patient.  No dysuria.  Foley was placed in ED  Past hospital admissions mostly for CHF exacerbations, and afib, and one for UTI/ urinary retention as well.   Home meds: -alb nebs/ zyloprim/ ecasa/ celebrex/ fe so4/ proscar/ T4/ Kdur/ miralax/ senokot -lantus 10 u hs/ metformin 1gm bid/ glucotrol xl 10 mg qd -lasix 20 bid/ lisinopril 2.5 qd -ativan 0.65m hs prn/ trazodone 50 mg hs/ neurontin 100 hs -prednisone 5 mg qam  ROS  denies CP  no joint pain   no HA  no blurry vision  no rash  no diarrhea  no nausea/ vomiting  no dysuria  no difficulty voiding  no change in urine color    Past Medical History  Past Medical History:  Diagnosis Date  . [place]rial [place] ([place]   . CHF ([place]estive heart failure) ([place]   . Diabetes mellitus   . Emphysema   . High cholesterol   . Hypertension   . Thyroid disease    Past Surgical History  Past Surgical History:  Procedure Laterality Date  . APPENDECTOMY    . CAROTID STENT     on blood thinner for stent  . REPLACEMENT TOTAL KNEE     left knee   Family History History reviewed. No pertinent family history. Social History  reports that he has quit smoking. His smoking use included cigarettes. He smoked 3.00 packs per day. he has  never used smokeless tobacco. He reports that he does not drink alcohol or use drugs. Allergies  Allergies  Allergen Reactions  . Codeine Other (See Comments)    unknown  . Demerol [Meperidine] Other (See Comments)     Unknown per pt  mar  . Diltiazem Other (See Comments)     Unknown per pt  mar  . Penicillins Other (See Comments)     Unknown per pt  mar  . Sulfa Drugs Cross Reactors Hives    [facility] Prior to Admission medications   Medication Sig Start Date End Date Taking? Authorizing Provider  acetaminophen (TYLENOL) 325 MG tablet Take 2 tablets (650 mg total) by mouth every 6 (six) hours as needed for mild pain, moderate pain, fever or headache. Patient taking differently: Take 650 mg by mouth at bedtime.  01/05/14  Yes ZKelvin Cellar MD  albuterol (PROVENTIL) (2.5 MG/3ML) 0.083% nebulizer solution Take 2.5 mg by nebulization every 6 (six) hours as needed for wheezing or shortness of breath.   Yes [provider]  allopurinol (ZYLOPRIM) 100 MG tablet Take 200 mg by mouth daily.   Yes [provider]  aspirin EC 81 MG tablet Take 81 mg by mouth daily.   Yes [provider]  Camphor (JOINTFLEX) 3.1 % CREA Apply 1 application topically 2 (two) times daily as needed (pain).   Yes [provider]  celecoxib (CELEBREX) 100 MG capsule Take 100 mg by mouth 2 (two) times daily.   Yes [provider]  ferrous sulfate 325 (65 FE) MG EC tablet Take 325 mg by mouth daily with breakfast.   Yes [provider]  finasteride (PROSCAR) 5 MG tablet Take 1 tablet (5 mg total) by mouth daily. 01/05/14  Yes Kelvin Cellar, MD  furosemide (LASIX) 20 MG tablet Take 1 tablet (20 mg total) by mouth 2 (two) times daily. 01/05/14  Yes Kelvin Cellar, MD  gabapentin (NEURONTIN) 100 MG capsule Take 100 mg by mouth at bedtime.   Yes [provider]  glipiZIDE (GLUCOTROL XL) 10 MG 24 hr tablet Take 10 mg by mouth daily with  breakfast.   Yes [provider]  levothyroxine (SYNTHROID, LEVOTHROID) 125 MCG tablet Take 125 mcg by mouth daily before breakfast.   Yes [provider]  lisinopril (PRINIVIL,ZESTRIL) 2.5 MG tablet Take 2.5 mg by mouth daily.   Yes [provider]  metFORMIN (GLUCOPHAGE) 1000 MG tablet Take 1,000 mg by mouth 2 (two) times daily with a meal.   Yes [provider]  Polyethyl Glycol-Propyl Glycol (SYSTANE OP) Apply 1-2 drops to eye daily as needed (dry eyes).   Yes [provider]  polyethylene glycol (MIRALAX / GLYCOLAX) packet Take 17 g by mouth 2 (two) times daily as needed for mild constipation.   Yes [provider]  potassium chloride SA (K-DUR,KLOR-CON) 20 MEQ tablet Take 20 mEq by mouth daily.   Yes [provider]  predniSONE (DELTASONE) 5 MG tablet Take 5 mg by mouth daily with breakfast.   Yes [provider]  senna (SENOKOT) 8.6 MG TABS tablet Take 1 tablet by mouth at bedtime.    Yes [provider]  traZODone (DESYREL) 50 MG tablet Take 50 mg by mouth at bedtime.   Yes [provider]  insulin glargine (LANTUS) 100 UNIT/ML injection Inject 0.1 mLs (10 Units total) into the skin at bedtime. Patient not taking: Reported on 06/21/2017 06/23/13   Kelvin Cellar, MD  LORazepam (ATIVAN) 0.5 MG tablet Take 1 tablet (0.5 mg total) by mouth at bedtime. Patient not taking: Reported on 06/21/2017 01/05/14   Kelvin Cellar, MD   Liver Function Tests Recent Labs  Lab 06/21/17 2145  AST 19  ALT 9*  ALKPHOS 50  BILITOT 0.8  PROT 6.4*  ALBUMIN 3.4*   No results for input(s): LIPASE, AMYLASE in the last 168 hours. CBC Recent Labs  Lab 06/21/17 2145  WBC 23.7*  NEUTROABS 21.9*  HGB 9.7*  HCT 30.2*  MCV 92.6  PLT 940   Basic Metabolic Panel Recent Labs  Lab 06/21/17 2145  NA 135  K 5.0  CL 102  CO2 23  GLUCOSE 191*  BUN 41*  CREATININE 1.75*  CALCIUM 9.0     Vitals:   06/21/17  2015 06/21/17 2245 06/21/17 2255 06/21/17 2322  BP:   (!) 107/50 (!) 102/57  Pulse:  86 82 77  Resp:  (!) 23 (!) 23 (!) 21  Temp:      SpO2:  100% 98% 99%  Weight: 66.2 kg (146 lb)     Height: 5' 8"  (1.727 m)      Exam: Gen eldelry pleasant WM, +cough and some wheezing audible No rash, cyanosis or gangrene Sclera anicteric, throat clear  No jvd or  bruits, flat neck veins Chest diffuse mild rhonchi, diffuse mild exp wheezing RRR no MRG Abd soft ntnd no mass or ascites +bs GU normal male w leg bag foley in place MS no joint effusions or deformity Ext no LE or UE edema / no wounds or ulcers Neuro is alert, Ox 3 , nf, gen'd weakness    Home meds: -alb nebs/ zyloprim/ ecasa/ celebrex/ fe so4/ proscar/ T4/ Kdur/ miralax/ senokot -lantus 10 u hs/ metformin 1gm bid/ glucotrol xl 10 mg qd -lasix 20 bid/ lisinopril 2.5 qd -ativan 0.60m hs prn/ trazodone 50 mg hs/ neurontin 100 hs -prednisone 5 mg qam   Na 135  K 5.0   CO2 23   BUN 41   Cr 1.75   Ca 9   Alb 3.4   LFT's ok  eGFR 35   Trop 0.03   Lact acid 3.24   Wbc 23k   Hb 9.7 plt 227 UA > cloudy, many bact, tntc WBC's, 0-5 rbc, 7.0. 1.010, neg ketone, mod LE  EKG (independ reviewed) > afib , no ischemia, occ PVC, LAFB  CXR (indep reviewed) > 1. Right greater than left bilateral lung base infiltrates. Radiographic follow-up to resolution recommended  2. Mild cardiomegaly.  Head CT > 1. Motion limits the study  2. No definite CT evidence for acute intracranial abnormality. Atrophy and small vessel ischemic changes of the white matter  Assessment: 1. Pneumonia / UTI - pt presenting w/ confusion/ sepsis/ ^^WBC.  Pt is DNR per the daughter, I spoke with her on the phone.  Admit and cont IV abx (for PCN allergic pt).  Cautious fluids. 2. DM2 - SSI for now, hold lantus 10 and metformin/ glucotrol, add back prn 3. Chronic afib - not on any rate-controlling meds. HR 85 on ekg.  4. Systolic CHF - last echo EF 35-40%. Hold lasix and ACEi  for now.  5. AKI - creat 1.75, due to acute illness, poss sepsis. Baseline creat is 1.00.  6. COPD - wears O2 at home 7. DNR       Plan - as above       SSan FidelD [facility] Pager [phone]  If 7PM-7AM, please contact night-coverage [email or web address] Password [email or web address] 06/21/2017, 11:25 PM

## 2017-06-21 NOTE — ED Notes (Signed)
PA at bedside.

## 2017-06-22 DIAGNOSIS — E119 Type 2 diabetes mellitus without complications: Secondary | ICD-10-CM | POA: Diagnosis present

## 2017-06-22 DIAGNOSIS — Z885 Allergy status to narcotic agent status: Secondary | ICD-10-CM | POA: Diagnosis not present

## 2017-06-22 DIAGNOSIS — I110 Hypertensive heart disease with heart failure: Secondary | ICD-10-CM | POA: Diagnosis present

## 2017-06-22 DIAGNOSIS — N390 Urinary tract infection, site not specified: Secondary | ICD-10-CM | POA: Diagnosis present

## 2017-06-22 DIAGNOSIS — Z9981 Dependence on supplemental oxygen: Secondary | ICD-10-CM | POA: Diagnosis not present

## 2017-06-22 DIAGNOSIS — I482 Chronic atrial fibrillation: Secondary | ICD-10-CM | POA: Diagnosis present

## 2017-06-22 DIAGNOSIS — R05 Cough: Secondary | ICD-10-CM | POA: Diagnosis present

## 2017-06-22 DIAGNOSIS — Z794 Long term (current) use of insulin: Secondary | ICD-10-CM | POA: Diagnosis not present

## 2017-06-22 DIAGNOSIS — I5022 Chronic systolic (congestive) heart failure: Secondary | ICD-10-CM | POA: Diagnosis present

## 2017-06-22 DIAGNOSIS — Z880 Allergy status to penicillin: Secondary | ICD-10-CM | POA: Diagnosis not present

## 2017-06-22 DIAGNOSIS — N179 Acute kidney failure, unspecified: Secondary | ICD-10-CM

## 2017-06-22 DIAGNOSIS — R6520 Severe sepsis without septic shock: Secondary | ICD-10-CM | POA: Diagnosis present

## 2017-06-22 DIAGNOSIS — Z7952 Long term (current) use of systemic steroids: Secondary | ICD-10-CM | POA: Diagnosis not present

## 2017-06-22 DIAGNOSIS — Z79890 Hormone replacement therapy (postmenopausal): Secondary | ICD-10-CM | POA: Diagnosis not present

## 2017-06-22 DIAGNOSIS — J189 Pneumonia, unspecified organism: Secondary | ICD-10-CM | POA: Diagnosis present

## 2017-06-22 DIAGNOSIS — Z79899 Other long term (current) drug therapy: Secondary | ICD-10-CM | POA: Diagnosis not present

## 2017-06-22 DIAGNOSIS — Z96659 Presence of unspecified artificial knee joint: Secondary | ICD-10-CM | POA: Diagnosis present

## 2017-06-22 DIAGNOSIS — Z66 Do not resuscitate: Secondary | ICD-10-CM

## 2017-06-22 DIAGNOSIS — J449 Chronic obstructive pulmonary disease, unspecified: Secondary | ICD-10-CM | POA: Diagnosis not present

## 2017-06-22 DIAGNOSIS — Z882 Allergy status to sulfonamides status: Secondary | ICD-10-CM | POA: Diagnosis not present

## 2017-06-22 DIAGNOSIS — J9610 Chronic respiratory failure, unspecified whether with hypoxia or hypercapnia: Secondary | ICD-10-CM | POA: Diagnosis present

## 2017-06-22 DIAGNOSIS — J440 Chronic obstructive pulmonary disease with acute lower respiratory infection: Secondary | ICD-10-CM | POA: Diagnosis present

## 2017-06-22 DIAGNOSIS — A419 Sepsis, unspecified organism: Secondary | ICD-10-CM | POA: Diagnosis present

## 2017-06-22 DIAGNOSIS — R059 Cough, unspecified: Secondary | ICD-10-CM | POA: Diagnosis present

## 2017-06-22 DIAGNOSIS — D72829 Elevated white blood cell count, unspecified: Secondary | ICD-10-CM | POA: Diagnosis present

## 2017-06-22 DIAGNOSIS — E7800 Pure hypercholesterolemia, unspecified: Secondary | ICD-10-CM | POA: Diagnosis present

## 2017-06-22 DIAGNOSIS — Y95 Nosocomial condition: Secondary | ICD-10-CM | POA: Diagnosis present

## 2017-06-22 DIAGNOSIS — Z7982 Long term (current) use of aspirin: Secondary | ICD-10-CM | POA: Diagnosis not present

## 2017-06-22 DIAGNOSIS — Z87891 Personal history of nicotine dependence: Secondary | ICD-10-CM | POA: Diagnosis not present

## 2017-06-22 DIAGNOSIS — Z9181 History of falling: Secondary | ICD-10-CM | POA: Diagnosis not present

## 2017-06-22 LAB — MRSA PCR SCREENING: MRSA by PCR: NEGATIVE

## 2017-06-22 LAB — STREP PNEUMONIAE URINARY ANTIGEN: STREP PNEUMO URINARY ANTIGEN: NEGATIVE

## 2017-06-22 MED ORDER — ASPIRIN EC 81 MG PO TBEC
81.0000 mg | DELAYED_RELEASE_TABLET | Freq: Every day | ORAL | Status: DC
Start: 2017-06-22 — End: 2017-06-25
  Administered 2017-06-22 – 2017-06-25 (×4): 81 mg via ORAL
  Filled 2017-06-22 (×5): qty 1

## 2017-06-22 MED ORDER — METHYLPREDNISOLONE SODIUM SUCC 125 MG IJ SOLR
40.0000 mg | Freq: Two times a day (BID) | INTRAMUSCULAR | Status: DC
  Administered 2017-06-22: 40 mg via INTRAVENOUS
  Filled 2017-06-22: qty 2

## 2017-06-22 MED ORDER — SODIUM CHLORIDE 0.45 % IV SOLN
INTRAVENOUS | Status: DC
  Administered 2017-06-22: 16:00:00 via INTRAVENOUS

## 2017-06-22 MED ORDER — LORAZEPAM 2 MG/ML IJ SOLN
0.5000 mg | Freq: Once | INTRAMUSCULAR | Status: AC
  Administered 2017-06-22: 0.5 mg via INTRAVENOUS
  Filled 2017-06-22: qty 1

## 2017-06-22 MED ORDER — DEXTROSE 5 % IV SOLN
1.0000 g | INTRAVENOUS | Status: DC
  Administered 2017-06-22 – 2017-06-23 (×2): 1 g via INTRAVENOUS
  Filled 2017-06-22 (×3): qty 1

## 2017-06-22 MED ORDER — ENOXAPARIN SODIUM 30 MG/0.3ML ~~LOC~~ SOLN: 30.0000 mg | SUBCUTANEOUS | Status: DC

## 2017-06-22 MED ORDER — SODIUM CHLORIDE 0.9 % IV SOLN: INTRAVENOUS | Status: DC

## 2017-06-22 MED ORDER — ENOXAPARIN SODIUM 30 MG/0.3ML ~~LOC~~ SOLN
30.0000 mg | SUBCUTANEOUS | Status: DC
  Administered 2017-06-22 – 2017-06-24 (×3): 30 mg via SUBCUTANEOUS
  Filled 2017-06-22 (×3): qty 0.3

## 2017-06-22 MED ORDER — LEVOTHYROXINE SODIUM 25 MCG PO TABS
125.0000 ug | ORAL_TABLET | Freq: Every day | ORAL | Status: DC
  Administered 2017-06-22 – 2017-06-25 (×4): 125 ug via ORAL
  Filled 2017-06-22 (×4): qty 1

## 2017-06-22 MED ORDER — LEVOFLOXACIN IN D5W 750 MG/150ML IV SOLN: 750.0000 mg | INTRAVENOUS | Status: DC

## 2017-06-22 MED ORDER — FINASTERIDE 5 MG PO TABS
5.0000 mg | ORAL_TABLET | Freq: Every day | ORAL | Status: DC
  Administered 2017-06-22 – 2017-06-25 (×4): 5 mg via ORAL
  Filled 2017-06-22 (×5): qty 1

## 2017-06-22 MED ORDER — ALBUTEROL SULFATE (2.5 MG/3ML) 0.083% IN NEBU: 2.5000 mg | INHALATION_SOLUTION | Freq: Four times a day (QID) | RESPIRATORY_TRACT | Status: DC | PRN

## 2017-06-22 MED ORDER — POLYETHYLENE GLYCOL 3350 17 G PO PACK
17.0000 g | PACK | Freq: Two times a day (BID) | ORAL | Status: DC | PRN
  Filled 2017-06-22: qty 1

## 2017-06-22 MED ORDER — GABAPENTIN 100 MG PO CAPS
100.0000 mg | ORAL_CAPSULE | Freq: Every day | ORAL | Status: DC
  Administered 2017-06-22 – 2017-06-24 (×3): 100 mg via ORAL
  Filled 2017-06-22 (×3): qty 1

## 2017-06-22 MED ORDER — DEXTROSE 5 % IV SOLN
1.0000 g | Freq: Three times a day (TID) | INTRAVENOUS | Status: DC
  Administered 2017-06-22: 1 g via INTRAVENOUS
  Filled 2017-06-22 (×4): qty 1

## 2017-06-22 MED ORDER — IPRATROPIUM-ALBUTEROL 0.5-2.5 (3) MG/3ML IN SOLN
3.0000 mL | Freq: Four times a day (QID) | RESPIRATORY_TRACT | Status: DC
  Administered 2017-06-22 (×2): 3 mL via RESPIRATORY_TRACT
  Filled 2017-06-22 (×3): qty 3

## 2017-06-22 NOTE — Progress Notes (Signed)
Pharmacy Antibiotic Note  Carlos Morton is a 83 y.o. male admitted on 06/21/2017 with HCAP and UTI.  Pharmacy has been consulted for Vancomycin and cefepime. Stopping levofloxacin & aztreonam as he tolerates cephalosporins.  06/22/2017 HCAP & UTI, + UA 12/17 12/17 CXR R > L lung base infiltrates Pt from SNF WBC 23.7, Cr 1.75, LA 3.25 AF PCN allergy but CTX, keflex, augmentin, zosyn given in past   Plan: Vancomycin 1gm iv x1 given 12/17 at MN, then check level in 48hr hours ( ordered for 12/19 at 2200) redose when < 20.  Cefepime 1 gm IV q24 F/u renal fxn, wbc, temp, culture data F/u MRSA PCR  Levels as needed  Height: 5\' 8"  (172.7 cm) Weight: 146 lb (66.2 kg) IBW/kg (Calculated) : 68.4  Temp (24hrs), Avg:98.6 F (37 C), Min:98.6 F (37 C), Max:98.6 F (37 C)  Recent Labs  Lab 06/21/17 2145 06/21/17 2157  WBC 23.7*  --   CREATININE 1.75*  --   LATICACIDVEN  --  3.24*    Estimated Creatinine Clearance: 22.1 mL/min (A) (by C-G formula based on SCr of 1.75 mg/dL (H)).    Allergies  Allergen Reactions  . Codeine Other (See Comments)    unknown  . Demerol [Meperidine] Other (See Comments)     Unknown per pt  mar  . Diltiazem Other (See Comments)     Unknown per pt  mar  . Penicillins Other (See Comments)     Unknown per pt  mar  . Sulfa Drugs Cross Reactors Hives    [facility]   Antimicrobials this admission:  Aztreonam 06/22/2017 >>12/18 Levofloxacin 06/22/2017 >>12/18 Vancomycin 06/22/2017 >> Cefepime 12/18 >>  Dose adjustments this admission:   Microbiology results:  12/18 Ucx>> 12/18 BCx2>> 12/18 MRSA PCR>>  Herby AbrahamMichelle T. Jacon Morton, Pharm.D. [phone] 06/22/2017 1:52 PM

## 2017-06-22 NOTE — Care Management Note (Signed)
Case Management Note  Patient Details  Name: Carlos Morton MRN: [id number] Date of Birth: 1917/06/28  CM contacted by [facility] rep Jorja Loaim who advised the pt was active with their services and will follow pt.  [facility]  Expected Discharge Date:  (UNKNOWN)               Expected Discharge Plan:  Assisted Living / Rest Home(HH)  Post Acute Care Choice:  Home Health, Resumption of Svcs/PTA Provider [facility]     [facility]  Status of Service:  In process, will continue to follow  Rica KoyanagiKritzer, Breydon Senters N, RN 06/22/2017, 12:35 PM

## 2017-06-22 NOTE — Progress Notes (Signed)
Pharmacy Antibiotic Note  Carlos Morton is a 83 y.o. male admitted on 06/21/2017 with pneumonia.  Pharmacy has been consulted for Vancomycin, levofloxacin, aztreonam dosing.  Plan: Vancomycin 1gm iv x1, then check level in 48hr hours, redose when < 20.   Levofloxacin 750mg  iv q48hr Aztreonam 1gm iv q8hr  Height: 5\' 8"  (172.7 cm) Weight: 146 lb (66.2 kg) IBW/kg (Calculated) : 68.4  Temp (24hrs), Avg:98.6 F (37 C), Min:98.6 F (37 C), Max:98.6 F (37 C)  Recent Labs  Lab 06/21/17 2145 06/21/17 2157  WBC 23.7*  --   CREATININE 1.75*  --   LATICACIDVEN  --  3.24*    Estimated Creatinine Clearance: 22.1 mL/min (A) (by C-G formula based on SCr of 1.75 mg/dL (H)).    Allergies  Allergen Reactions  . Codeine Other (See Comments)    unknown  . Demerol [Meperidine] Other (See Comments)     Unknown per pt  mar  . Diltiazem Other (See Comments)     Unknown per pt  mar  . Penicillins Other (See Comments)     Unknown per pt  mar  . Sulfa Drugs Cross Reactors Hives    [facility]    Antimicrobials this admission: Aztreonam 06/22/2017 >> Levofloxacin 06/22/2017 >> Vancomycin 06/22/2017 >>   Dose adjustments this admission: -  Microbiology results: pending  Thank you for allowing pharmacy to be a part of this patient's care.  Aleene DavidsonGrimsley Jr, Garold Sheeler Crowford 06/22/2017 5:27 AM

## 2017-06-22 NOTE — ED Notes (Signed)
Note sent to pharmacy for missing dose Azactam 1 g IVPB

## 2017-06-22 NOTE — ED Notes (Signed)
Report given to Floor RN 

## 2017-06-22 NOTE — Progress Notes (Signed)
PROGRESS NOTE    Carlos Morton  ZHY:[id number] DOB: 1916/11/24 DOA: 06/21/2017 PCP: Karle PlumberArvind, Moogali M, MD  Brief Narrative:Carlos Morton is a 83 y.o. male [facility] with [facility] status.  Pt was recently rx'd for UTI and cough. Has had progressive lethargy and confusion at the SNF.  No dementia at baseline.  Has hx DM.  Presenting now to ED with WBC 23k , pyuria and confusion. Has had a persistent cough.  CXR showed focal infiltrates at the R base > L base.  He has rec'd IV abx for PNA and UTI   Assessment & Plan:   -Healthcare associated pneumonia versus aspiration -follow up blood cultures,  -change aztreonam and Levaquin to cefepime Check SLP evaluation -Cautious IV fluids today  Suspected UTI -UA appears abnormal and some symptoms of dysuria and odor -Check urine culture and empiric cefepime  Acute kidney injury, creatinine of 1.75 likely due to sepsis and ACE inhibitor -Gentle hydration today, hold ACE inhibitor -Monitor kidney function  COPD/chronic respiratory failure on home O2 -Stable, nebs when necessary, antibiotics for pneumonia as above - not wheezing currently-  Chronic systolic CHF -Last echocardiogram with EF of 35-40% -Holding Lasix and ACE inhibitor in the setting of sepsis and acute kidney injury -Resume in 1-2 days  Chronic atrial fibrillation -In sinus rhythm heart rate controlled at this time -Not on any beta blockers -Not on anticoagulation likely due to advanced age and fall risk  Diabetes mellitus -Hold Lantus and oral hypoglycemics -Sliding-scale insulin for now   DVT prophylaxis: add lovenox Code Status: DNR Family Communication: none at bedside Disposition Plan: SNF likely     Procedures:   Antimicrobials: Vancomycin and cefepime 12/17   Subjective: -Feels better, upset about being in the emergency room, breathing better  Objective: Vitals:   06/22/17 0353 06/22/17 0500 06/22/17 0600 06/22/17 0700    BP: (!) 96/45 (!) 95/53 (!) 97/37 (!) 100/43  Pulse: 71 (!) 59 69 66  Resp: 17 17 19 19   Temp:      SpO2: 100% 100% 100% 100%  Weight:      Height:        Intake/Output Summary (Last 24 hours) at 06/22/2017 1527 Last data filed at 06/22/2017 1125 Gross per 24 hour  Intake 2500 ml  Output -  Net 2500 ml   Filed Weights   06/21/17 2015  Weight: 66.2 kg (146 lb)    Examination:  General exam: Anxious elderly male, alert awake oriented to self and partly to place Respiratory system: Clear to auscultation. Respiratory effort rhonchi at both bases Cardiovascular system: S1 & S2 heard, RRR. No JVD, murmurs, rubs, gallops  Gastrointestinal system: Abdomen is nondistended, soft and nontender.Normal bowel sounds heard. Central nervous system: Alert and oriented. No focal neurological deficits. Extremities: Symmetric 5 x 5 power. Skin: No rashes, lesions or ulcers Psychiatry: Appropriate for the most part, on a rare occasion was somewhat confused    Data Reviewed:   CBC: Recent Labs  Lab 06/21/17 2145  WBC 23.7*  NEUTROABS 21.9*  HGB 9.7*  HCT 30.2*  MCV 92.6  PLT 227   Basic Metabolic Panel: Recent Labs  Lab 06/21/17 2145  NA 135  K 5.0  CL 102  CO2 23  GLUCOSE 191*  BUN 41*  CREATININE 1.75*  CALCIUM 9.0   GFR: Estimated Creatinine Clearance: 22.1 mL/min (A) (by C-G formula based on SCr of 1.75 mg/dL (H)). Liver Function Tests: Recent Labs  Lab 06/21/17 2145  AST 19  ALT 9*  ALKPHOS 50  BILITOT 0.8  PROT 6.4*  ALBUMIN 3.4*   No results for input(s): LIPASE, AMYLASE in the last 168 hours. Recent Labs  Lab 06/21/17 2145  AMMONIA 17   Coagulation Profile: No results for input(s): INR, PROTIME in the last 168 hours. Cardiac Enzymes: No results for input(s): CKTOTAL, CKMB, CKMBINDEX, TROPONINI in the last 168 hours. BNP (last 3 results) No results for input(s): PROBNP in the last 8760 hours. HbA1C: No results for input(s): HGBA1C in the last  72 hours. CBG: Recent Labs  Lab 06/21/17 2128  GLUCAP 212*   Lipid Profile: No results for input(s): CHOL, HDL, LDLCALC, TRIG, CHOLHDL, LDLDIRECT in the last 72 hours. Thyroid Function Tests: No results for input(s): TSH, T4TOTAL, FREET4, T3FREE, THYROIDAB in the last 72 hours. Anemia Panel: No results for input(s): VITAMINB12, FOLATE, FERRITIN, TIBC, IRON, RETICCTPCT in the last 72 hours. Urine analysis:    Component Value Date/Time   COLORURINE YELLOW 06/21/2017 2157   APPEARANCEUR CLOUDY (A) 06/21/2017 2157   LABSPEC 1.010 06/21/2017 2157   PHURINE 7.0 06/21/2017 2157   GLUCOSEU NEGATIVE 06/21/2017 2157   HGBUR SMALL (A) 06/21/2017 2157   BILIRUBINUR NEGATIVE 06/21/2017 2157   KETONESUR NEGATIVE 06/21/2017 2157   PROTEINUR NEGATIVE 06/21/2017 2157   UROBILINOGEN 1.0 12/29/2013 0201   NITRITE NEGATIVE 06/21/2017 2157   LEUKOCYTESUR MODERATE (A) 06/21/2017 2157   Sepsis Labs: @LABRCNTIP (procalcitonin:4,lacticidven:4)  )No results found for this or any previous visit (from the past 240 hour(s)).       Radiology Studies: Dg Chest 2 View  Result Date: 06/21/2017 CLINICAL DATA:  Productive cough EXAM: CHEST  2 VIEW COMPARISON:  01/01/2014 FINDINGS: Elevation of the left diaphragm. No large pleural effusion. Right greater than left lung base pulmonary infiltrates. Enlarged cardiomediastinal silhouette. No pneumothorax. IMPRESSION: 1. Right greater than left bilateral lung base infiltrates. Radiographic follow-up to resolution recommended 2. Mild cardiomegaly. Electronically Signed   By: Jasmine PangKim  Fujinaga M.D.   On: 06/21/2017 21:48   Ct Head Wo Contrast  Result Date: 06/21/2017 CLINICAL DATA:  Altered mental status EXAM: CT HEAD WITHOUT CONTRAST TECHNIQUE: Contiguous axial images were obtained from the base of the skull through the vertex without intravenous contrast. COMPARISON:  None. FINDINGS: Brain: Motion degradation. No gross acute territorial infarction, hemorrhage, or  intracranial mass is visualized. Mild to moderate small vessel ischemic changes of the white matter. Moderate atrophy. Old lacunar infarct in the right white matter. Vascular: No hyperdense vessels.  Carotid artery calcification. Skull: Mastoid sclerosis and minimal fluid on the left.  No fracture Sinuses/Orbits: Moderate mucosal thickening in the maxillary, sphenoid and ethmoid sinuses. No acute orbital abnormality. Other: None IMPRESSION: 1. Motion limits the study 2. No definite CT evidence for acute intracranial abnormality. Atrophy and small vessel ischemic changes of the white matter Electronically Signed   By: Jasmine PangKim  Fujinaga M.D.   On: 06/21/2017 23:32        Scheduled Meds: . aspirin EC  81 mg Oral Daily  . finasteride  5 mg Oral Daily  . gabapentin  100 mg Oral QHS  . ipratropium-albuterol  3 mL Nebulization Q6H  . levothyroxine  125 mcg Oral QAC breakfast  . methylPREDNISolone (SOLU-MEDROL) injection  40 mg Intravenous Q12H   Continuous Infusions: . ceFEPime (MAXIPIME) IV       LOS: 0 days    Time spent: 35min    Zannie CovePreetha Jerusha Reising, MD [facility] Page via [email or web address], password [email or web address] After 7PM please contact  night-coverage  06/22/2017, 3:27 PM

## 2017-06-22 NOTE — Progress Notes (Signed)
Inpatient Diabetes Program Recommendations  AACE/ADA: New Consensus Statement on Inpatient Glycemic Control (2015)  Target Ranges:  Prepandial:   less than 140 mg/dL      Peak postprandial:   less than 180 mg/dL (1-2 hours)      Critically ill patients:  140 - 180 mg/dL   Results for Carlos Morton, Carlos Morton (MRN [id number]) as of 06/22/2017 15:17  Ref. Range 06/21/2017 21:28  Glucose-Capillary Latest Ref Range: 65 - 99 mg/dL 409212 (H)    Admit with: Pneumonia/ UTI  History: DM  Home DM Meds: Lantus 10 units QHS (patient not taking)       Metformin 1000 mg BID       Glipizide 10 mg daily  Current Insulin Orders: None      MD- Please consider placing orders for Novolog Sensitive Correction Scale/ SSI (0-9 units) TID AC + HS      --Will follow patient during hospitalization--  Ambrose FinlandJeannine Johnston Toren Tucholski RN, MSN, CDE Diabetes Coordinator Inpatient Glycemic Control Team Team Pager: [phone] (8a-5p)

## 2017-06-23 LAB — BASIC METABOLIC PANEL
Anion gap: 6 (ref 5–15)
BUN: 30 mg/dL — ABNORMAL HIGH (ref 6–20)
CO2: 24 mmol/L (ref 22–32)
CREATININE: 1.29 mg/dL — AB (ref 0.61–1.24)
Calcium: 8.4 mg/dL — ABNORMAL LOW (ref 8.9–10.3)
Chloride: 106 mmol/L (ref 101–111)
GFR calc non Af Amer: 44 mL/min — ABNORMAL LOW (ref >60)
GFR, EST AFRICAN AMERICAN: 51 mL/min — AB (ref >60)
GLUCOSE: 190 mg/dL — AB (ref 65–99)
Potassium: 4 mmol/L (ref 3.5–5.1)
Sodium: 136 mmol/L (ref 135–145)

## 2017-06-23 LAB — URINE CULTURE

## 2017-06-23 LAB — CBC
HEMATOCRIT: 23.6 % — AB (ref 39.0–52.0)
HEMOGLOBIN: 7.8 g/dL — AB (ref 13.0–17.0)
MCH: 30.1 pg (ref 26.0–34.0)
MCHC: 33.1 g/dL (ref 30.0–36.0)
MCV: 91.1 fL (ref 78.0–100.0)
Platelets: 151 10*3/uL (ref 150–400)
RBC: 2.59 MIL/uL — ABNORMAL LOW (ref 4.22–5.81)
RDW: 14.2 % (ref 11.5–15.5)
WBC: 9.7 10*3/uL (ref 4.0–10.5)

## 2017-06-23 MED ORDER — IPRATROPIUM-ALBUTEROL 0.5-2.5 (3) MG/3ML IN SOLN
3.0000 mL | Freq: Three times a day (TID) | RESPIRATORY_TRACT | Status: DC
  Administered 2017-06-23 – 2017-06-25 (×6): 3 mL via RESPIRATORY_TRACT
  Filled 2017-06-23 (×7): qty 3

## 2017-06-23 MED ORDER — ALBUTEROL SULFATE (2.5 MG/3ML) 0.083% IN NEBU: 2.5000 mg | INHALATION_SOLUTION | RESPIRATORY_TRACT | Status: DC | PRN

## 2017-06-23 NOTE — Evaluation (Signed)
Occupational Therapy Evaluation Patient Details Name: Carlos Morton MRN: [id number] DOB: Nov 21, 1916 Today's Date: 06/23/2017    History of Present Illness Pt was admitted for AMS.  Dx: pna, recent uti.  PMH:  CHF, DM   Clinical Impression   This 83 year old man was admitted for the above. He resides at ALF and reports he has help for adls but ambulates unsupervised with RW. Will follow in acute setting with supervision level goals for toileting/standing grooming     Follow Up Recommendations  (assist for adls;mobility)    Equipment Recommendations  None recommended by OT    Recommendations for Other Services       Precautions / Restrictions Precautions Precautions: Fall Restrictions Weight Bearing Restrictions: No      Mobility Bed Mobility               General bed mobility comments: oob  Transfers Overall transfer level: Needs assistance Equipment used: Rolling walker (2 wheeled) Transfers: Sit to/from Stand Sit to Stand: Min guard         General transfer comment: for safety    Balance                                           ADL either performed or assessed with clinical judgement   ADL Overall ADL's : Needs assistance/impaired Eating/Feeding: Independent   Grooming: Set up;Wash/dry hands;Wash/dry face;Sitting   Upper Body Bathing: Set up;Sitting   Lower Body Bathing: Moderate assistance;Sit to/from stand   Upper Body Dressing : Minimal assistance;Sitting   Lower Body Dressing: Maximal assistance;Sit to/from stand                 General ADL Comments: pt up in chair and wanted to remain up.  on 2 liters 02.  Pt with decreased BP but asymptomatic     Vision Baseline Vision/History: Macular Degeneration(per friends, who came at end of session)       Perception     Praxis      Pertinent Vitals/Pain Pain Assessment: No/denies pain     Hand Dominance     Extremity/Trunk Assessment Upper Extremity  Assessment Upper Extremity Assessment: Generalized weakness           Communication Communication Communication: HOH   Cognition Arousal/Alertness: Awake/alert Behavior During Therapy: WFL for tasks assessed/performed                                   General Comments: appears mostly wfls; some word finding difficulties   General Comments       Exercises     Shoulder Instructions      Home Living Family/patient expects to be discharged to:: Assisted living                                 Additional Comments: [facility].  Pt uses walker and has catheter at baseline.  States staff helps with bathing/dressing daily      Prior Functioning/Environment Level of Independence: Needs assistance        Comments: assist for adls; ambulates at mod I level per pt. Staff empties leg bag from catheter also        OT Problem List: Decreased strength;Decreased activity tolerance;Impaired balance (sitting and/or  standing);Cardiopulmonary status limiting activity;Impaired vision/perception      OT Treatment/Interventions: Self-care/ADL training;DME and/or AE instruction;Patient/family education;Balance training    OT Goals(Current goals can be found in the care plan section) Acute Rehab OT Goals Patient Stated Goal: none stated OT Goal Formulation: With patient Time For Goal Achievement: 07/07/17 Potential to Achieve Goals: Good ADL Goals Pt Will Perform Grooming: standing;with supervision Pt Will Transfer to Toilet: with supervision;ambulating;bedside commode  OT Frequency: Min 2X/week   Barriers to D/C:            Co-evaluation              AM-PAC PT "6 Clicks" Daily Activity     Outcome Measure Help from another person eating meals?: A Little Help from another person taking care of personal grooming?: A Little Help from another person toileting, which includes using toliet, bedpan, or urinal?: A Little Help from another  person bathing (including washing, rinsing, drying)?: A Lot Help from another person to put on and taking off regular upper body clothing?: A Little Help from another person to put on and taking off regular lower body clothing?: A Lot 6 Click Score: 16   End of Session    Activity Tolerance: Patient tolerated treatment well Patient left: in chair;with call bell/phone within reach;with chair alarm set;with family/visitor present  OT Visit Diagnosis: Muscle weakness (generalized) (M62.81)                Time: [id number] OT Time Calculation (min): 20 min Charges:  OT General Charges $OT Visit: 1 Visit OT Evaluation $OT Eval Low Complexity: 1 Low G-Codes:     [place]videreMaryellen Yuriel Morton, OTR/L [phone] 06/23/2017  Carlos Morton 06/23/2017, 3:15 PM

## 2017-06-23 NOTE — Progress Notes (Signed)
Patient had a 9 beat run of Vtach. Patient sleeping. On call made aware. No new orders given.

## 2017-06-23 NOTE — Evaluation (Signed)
Clinical/Bedside Swallow Evaluation Patient Details  Name: Carlos Morton MRN: [id number] Date of Birth: February 02, 1917  Today's Date: 06/23/2017 Time: SLP Start Time (ACUTE ONLY): 1402 SLP Stop Time (ACUTE ONLY): 1415 SLP Time Calculation (min) (ACUTE ONLY): 13 min  Past Medical History:  Past Medical History:  Diagnosis Date  . Atrial fibrillation (HCC)   . CHF (congestive heart failure) (HCC)   . Diabetes mellitus   . Emphysema   . High cholesterol   . Hypertension   . Thyroid disease    Past Surgical History:  Past Surgical History:  Procedure Laterality Date  . APPENDECTOMY    . CAROTID STENT     on blood thinner for stent  . REPLACEMENT TOTAL KNEE     left knee   HPI:  83 [facility]. Pt was recently rx'd for UTI and cough. Has had progressive lethargy and confusion at the SNF. No dementia at baseline. Has hx DM. Presenting now to ED with WBC 23k , pyuria and confusion. Has had a persistent cough. CXR showed focal infiltrates at the R base >L base. He has rec'd IV abx for PNA and UTI.  Dx HCAP vs aspiration, suspected UTI, COPD on home 02. MBS in 2015 revealed mild deficits, mild pharyngeal residue, occasional penetration, mild esophageal component.    Assessment / Plan / Recommendation Clinical Impression  Pt presents with quite functional swallow with adequate mastication, brisk swallow response, no overt s/s of aspiration, no focal deficits.  Recommend continuing a regular diet, thin liquids.  Meds whole in liquid; give in puree if coughs with water.  No SLP f/u needed.  SLP Visit Diagnosis: Dysphagia, unspecified (R13.10)    Aspiration Risk  No limitations    Diet Recommendation   regular solids, thin liquids  Medication Administration: Whole meds with liquid    Other  Recommendations Oral Care Recommendations: Oral care BID   Follow up Recommendations None      Frequency and Duration             Prognosis        Swallow Study   General HPI: 83 [facility]. Pt was recently rx'd for UTI and cough. Has had progressive lethargy and confusion at the SNF. No dementia at baseline. Has hx DM. Presenting now to ED with WBC 23k , pyuria and confusion. Has had a persistent cough. CXR showed focal infiltrates at the R base >L base. He has rec'd IV abx for PNA and UTI.  Dx HCAP vs aspiration, suspected UTI, COPD on home 02. MBS in 2015 revealed mild deficits, mild pharyngeal residue, occasional penetration, mild esophageal component.  Type of Study: Bedside Swallow Evaluation Previous Swallow Assessment: see HPI Diet Prior to this Study: Regular;Thin liquids Temperature Spikes Noted: No Respiratory Status: Nasal cannula History of Recent Intubation: No Behavior/Cognition: Alert;Cooperative;Pleasant mood Oral Cavity Assessment: Within Functional Limits Oral Care Completed by SLP: No Oral Cavity - Dentition: Adequate natural dentition Vision: Functional for self-feeding Self-Feeding Abilities: Able to feed self Patient Positioning: Upright in chair Baseline Vocal Quality: Normal Volitional Cough: Strong Volitional Swallow: Able to elicit    Oral/Motor/Sensory Function Overall Oral Motor/Sensory Function: Within functional limits   Ice Chips Ice chips: Within functional limits   Thin Liquid Thin Liquid: Within functional limits    Nectar Thick Nectar Thick Liquid: Not tested   Honey Thick Honey Thick Liquid: Not tested   Puree Puree: Within  functional limits   Solid   GO   Solid: Within functional limits        Blenda Morton, Carlos Guinyard Laurice 06/23/2017,2:21 PM  Marchelle FolksAmanda L. Samson Fredericouture, [place]MA CCC/SLP Pager [phone]

## 2017-06-23 NOTE — Progress Notes (Signed)
PROGRESS NOTE    Carlos Morton  ZOX:[id number] DOB: 11-25-1916 DOA: 06/21/2017 PCP: Carlos Morton  Brief Narrative:Carlos Morton is a 83 y.o. [facility], has chronic foley.  Pt was recently rx'd for UTI and cough. Has had progressive lethargy and confusion at the SNF.  No dementia at baseline.  Has hx DM.  Presenting now to ED with WBC 23k , pyuria and confusion. Has had a persistent cough.  CXR showed focal infiltrates at the R base > L base.  He has rec'd IV abx for PNA and UTI   Assessment & Plan:   -Healthcare associated pneumonia versus aspiration -Blood cx NGTD -DC vancomycin, continue cefepime -Check SLP evaluation -cut down IVF -ambulate, PT/OT eval  Suspected UTI -UA appears abnormal and some symptoms of dysuria and odor -FU urine culture and continue empiric cefepime -chronic foley, last changed 2 weeks ago  Acute kidney injury, creatinine of 1.75 likely due to sepsis and ACE inhibitor -improved, cut down IVF, ACE on hold  COPD/chronic respiratory failure on home O2 -Stable, nebs when necessary, antibiotics for pneumonia as above - not wheezing currently-  Chronic systolic CHF -Last echocardiogram with EF of 35-40% -Holding Lasix and ACE inhibitor in the setting of sepsis and acute kidney injury -Resume tomorrow   Chronic atrial fibrillation -In sinus rhythm heart rate controlled at this time -Not on any beta blockers -Not on anticoagulation likely due to advanced age and fall risk  Diabetes mellitus -Hold Lantus and oral hypoglycemics -Sliding-scale insulin for now   DVT prophylaxis: add lovenox Code Status: DNR Family Communication: none at bedside Disposition Plan: SNF likely     Procedures:   Antimicrobials: Vancomycin and cefepime 12/17   Subjective: -feeling better, some cough, breathing ok Objective: Vitals:   06/22/17 2000 06/23/17 0626 06/23/17 0837 06/23/17 1300  BP: 130/67  (!) 96/43  (!) (P) 94/55  Pulse:  73  (P) 63  Resp: 20 20    Temp: 97.7 F (36.5 C) 97.6 F (36.4 C)    TempSrc: Oral Oral    SpO2: 97% 100% 98%   Weight:      Height:        Intake/Output Summary (Last 24 hours) at 06/23/2017 1336 Last data filed at 06/23/2017 [id number] Gross per 24 hour  Intake 884 ml  Output 700 ml  Net 184 ml   Filed Weights   06/21/17 2015  Weight: 66.2 kg (146 lb)    Examination:  Gen: Awake, Alert, Oriented X 2, no distress HEENT: PERRLA, Neck supple, no JVD Lungs: ronchi at both bases CVS: RRR,No Gallops,Rubs or new Murmurs Abd: soft, Non tender, non distended, BS present Extremities: No Cyanosis, Clubbing or edema Skin: no new rashes   Data Reviewed:   CBC: Recent Labs  Lab 06/21/17 2145 06/23/17 0601  WBC 23.7* 9.7  NEUTROABS 21.9*  --   HGB 9.7* 7.8*  HCT 30.2* 23.6*  MCV 92.6 91.1  PLT 227 151   Basic Metabolic Panel: Recent Labs  Lab 06/21/17 2145 06/23/17 0601  NA 135 136  K 5.0 4.0  CL 102 106  CO2 23 24  GLUCOSE 191* 190*  BUN 41* 30*  CREATININE 1.75* 1.29*  CALCIUM 9.0 8.4*   GFR: Estimated Creatinine Clearance: 29.9 mL/min (A) (by C-G formula based on SCr of 1.29 mg/dL (H)). Liver Function Tests: Recent Labs  Lab 06/21/17 2145  AST 19  ALT 9*  ALKPHOS 50  BILITOT  0.8  PROT 6.4*  ALBUMIN 3.4*   No results for input(s): LIPASE, AMYLASE in the last 168 hours. Recent Labs  Lab 06/21/17 2145  AMMONIA 17   Coagulation Profile: No results for input(s): INR, PROTIME in the last 168 hours. Cardiac Enzymes: No results for input(s): CKTOTAL, CKMB, CKMBINDEX, TROPONINI in the last 168 hours. BNP (last 3 results) No results for input(s): PROBNP in the last 8760 hours. HbA1C: No results for input(s): HGBA1C in the last 72 hours. CBG: Recent Labs  Lab 06/21/17 2128  GLUCAP 212*   Lipid Profile: No results for input(s): CHOL, HDL, LDLCALC, TRIG, CHOLHDL, LDLDIRECT in the last 72 hours. Thyroid Function  Tests: No results for input(s): TSH, T4TOTAL, FREET4, T3FREE, THYROIDAB in the last 72 hours. Anemia Panel: No results for input(s): VITAMINB12, FOLATE, FERRITIN, TIBC, IRON, RETICCTPCT in the last 72 hours. Urine analysis:    Component Value Date/Time   COLORURINE YELLOW 06/21/2017 2157   APPEARANCEUR CLOUDY (A) 06/21/2017 2157   LABSPEC 1.010 06/21/2017 2157   PHURINE 7.0 06/21/2017 2157   GLUCOSEU NEGATIVE 06/21/2017 2157   HGBUR SMALL (A) 06/21/2017 2157   BILIRUBINUR NEGATIVE 06/21/2017 2157   KETONESUR NEGATIVE 06/21/2017 2157   PROTEINUR NEGATIVE 06/21/2017 2157   UROBILINOGEN 1.0 12/29/2013 0201   NITRITE NEGATIVE 06/21/2017 2157   LEUKOCYTESUR MODERATE (A) 06/21/2017 2157   Sepsis Labs: @LABRCNTIP (procalcitonin:4,lacticidven:4)  ) Recent Results (from the past 240 hour(s))  Culture, Urine     Status: Abnormal   Collection Time: 06/22/17 12:43 PM  Result Value Ref Range Status   Specimen Description URINE, RANDOM  Final   Special Requests Immunocompromised  Final   Culture MULTIPLE SPECIES PRESENT, SUGGEST RECOLLECTION (A)  Final   Report Status 06/23/2017 FINAL  Final  MRSA PCR Screening     Status: None   Collection Time: 06/22/17  4:19 PM  Result Value Ref Range Status   MRSA by PCR NEGATIVE NEGATIVE Final    Comment:        The GeneXpert MRSA Assay (FDA approved for NASAL specimens only), is one component of a comprehensive MRSA colonization surveillance program. It is not intended to diagnose MRSA infection nor to guide or monitor treatment for MRSA infections.          Radiology Studies: Dg Chest 2 View  Result Date: 06/21/2017 CLINICAL DATA:  Productive cough EXAM: CHEST  2 VIEW COMPARISON:  01/01/2014 FINDINGS: Elevation of the left diaphragm. No large pleural effusion. Right greater than left lung base pulmonary infiltrates. Enlarged cardiomediastinal silhouette. No pneumothorax. IMPRESSION: 1. Right greater than left bilateral lung base  infiltrates. Radiographic follow-up to resolution recommended 2. Mild cardiomegaly. Electronically Signed   By: Jasmine PangKim  Fujinaga M.D.   On: 06/21/2017 21:48   Ct Head Wo Contrast  Result Date: 06/21/2017 CLINICAL DATA:  Altered mental status EXAM: CT HEAD WITHOUT CONTRAST TECHNIQUE: Contiguous axial images were obtained from the base of the skull through the vertex without intravenous contrast. COMPARISON:  None. FINDINGS: Brain: Motion degradation. No gross acute territorial infarction, hemorrhage, or intracranial mass is visualized. Mild to moderate small vessel ischemic changes of the white matter. Moderate atrophy. Old lacunar infarct in the right white matter. Vascular: No hyperdense vessels.  Carotid artery calcification. Skull: Mastoid sclerosis and minimal fluid on the left.  No fracture Sinuses/Orbits: Moderate mucosal thickening in the maxillary, sphenoid and ethmoid sinuses. No acute orbital abnormality. Other: None IMPRESSION: 1. Motion limits the study 2. No definite CT evidence for acute intracranial abnormality. Atrophy  and small vessel ischemic changes of the white matter Electronically Signed   By: Jasmine Pang M.D.   On: 06/21/2017 23:32        Scheduled Meds: . aspirin EC  81 mg Oral Daily  . enoxaparin (LOVENOX) injection  30 mg Subcutaneous Q24H  . finasteride  5 mg Oral Daily  . gabapentin  100 mg Oral QHS  . ipratropium-albuterol  3 mL Nebulization TID  . levothyroxine  125 mcg Oral QAC breakfast   Continuous Infusions: . ceFEPime (MAXIPIME) IV Stopped (06/22/17 2123)     LOS: 1 day    Time spent:    Zannie Cove, Morton [facility] Page via [email or web address], password [email or web address] After 7PM please contact night-coverage  06/23/2017, 1:36 PM

## 2017-06-23 NOTE — Evaluation (Signed)
Physical Therapy Evaluation Patient Details Name: Carlos Morton MRN: [id number] DOB: 1917-04-09 Today's Date: 06/23/2017   History of Present Illness  Pt was admitted for AMS, Pna.  PMH:  CHF, DM  Clinical Impression  On eval, pt required Min assist for mobility He declined ambulation on today. He was able to pivot from recliner to bed using a RW. Pt c/o some fatigue. Discussed d/c plan-pt plans [facility] if [facility]. Will follow and progress activity as tolerated.     Follow Up Recommendations Home health PT(at ALF if pt is agreeable)    Equipment Recommendations  None recommended by PT    Recommendations for Other Services       Precautions / Restrictions Precautions Precautions: Fall Precaution Comments: O2 during day Restrictions Weight Bearing Restrictions: No      Mobility  Bed Mobility Overal bed mobility: Needs Assistance Bed Mobility: Sit to Supine       Sit to supine: Supervision   General bed mobility comments: for lines, safety  Transfers Overall transfer level: Needs assistance Equipment used: Rolling walker (2 wheeled) Transfers: Sit to/from [employer]Stand;Stand Pivot Transfers Sit to Stand: Min assist Stand pivot transfers: Min assist       General transfer comment: Assist to steady. cues for safety  Ambulation/Gait             General Gait Details: NT-pt declined ambulation due to fatigue  Stairs            Wheelchair Mobility    Modified Rankin (Stroke Patients Only)       Balance Overall balance assessment: Needs assistance         Standing balance support: Bilateral upper extremity supported Standing balance-Leahy Scale: Poor Standing balance comment: requires RW                             Pertinent Vitals/Pain Pain Assessment: No/denies pain    Home Living Family/patient expects to be discharged to:: Assisted living                 Additional Comments[facility].  Pt  uses walker and has catheter at baseline.  States staff helps with bathing/dressing daily    Prior Function Level of Independence: Needs assistance   Gait / Transfers Assistance Needed: mod ind with rollator. walks to dining room which is less than 25 feet away per pt  ADL's / Homemaking Assistance Needed: assist for bathing, dressing, meds  Comments: Staff empties leg bag from catheter also     Hand Dominance        Extremity/Trunk Assessment   Upper Extremity Assessment Upper Extremity Assessment: Defer to OT evaluation    Lower Extremity Assessment Lower Extremity Assessment: Generalized weakness    Cervical / Trunk Assessment Cervical / Trunk Assessment: Kyphotic  Communication   Communication: HOH  Cognition Arousal/Alertness: Awake/alert Behavior During Therapy: WFL for tasks assessed/performed Overall Cognitive Status: Within Functional Limits for tasks assessed                                 General Comments: appears mostly wfls; some word finding difficulties      General Comments      Exercises     Assessment/Plan    PT Assessment Patient needs continued PT services  PT Problem List Decreased mobility;Decreased strength;Decreased activity tolerance;Decreased knowledge of use of  DME;Decreased balance       PT Treatment Interventions DME instruction;Functional mobility training;Balance training;Therapeutic activities;Therapeutic exercise;Patient/family education    PT Goals (Current goals can be found in the Care Plan section)  Acute Rehab PT Goals Patient Stated Goal: none stated PT Goal Formulation: With patient Time For Goal Achievement: 07/07/17 Potential to Achieve Goals: Fair    Frequency Min 3X/week   Barriers to discharge        Co-evaluation               AM-PAC PT "6 Clicks" Daily Activity  Outcome Measure Difficulty turning over in bed (including adjusting bedclothes, sheets and blankets)?: A  Little Difficulty moving from lying on back to sitting on the side of the bed? : A Little Difficulty sitting down on and standing up from a chair with arms (e.g., wheelchair, bedside commode, etc,.)?: Unable Help needed moving to and from a bed to chair (including a wheelchair)?: A Little Help needed walking in hospital room?: A Little Help needed climbing 3-5 steps with a railing? : A Lot 6 Click Score: 15    End of Session Equipment Utilized During Treatment: Oxygen Activity Tolerance: Patient tolerated treatment well Patient left: in bed;with call bell/phone within reach;with bed alarm set   PT Visit Diagnosis: Muscle weakness (generalized) (M62.81);Other abnormalities of gait and mobility (R26.89)    Time: [id number] PT Time Calculation (min) (ACUTE ONLY): 11 min   Charges:   PT Evaluation $PT Eval Moderate Complexity: 1 Mod     PT G Codes:          Rebeca AlertJannie Aden Sek, MPT Pager: [phone]

## 2017-06-24 LAB — CBC
HEMATOCRIT: 25.3 % — AB (ref 39.0–52.0)
HEMOGLOBIN: 8.2 g/dL — AB (ref 13.0–17.0)
MCH: 29.9 pg (ref 26.0–34.0)
MCHC: 32.4 g/dL (ref 30.0–36.0)
MCV: 92.3 fL (ref 78.0–100.0)
Platelets: 175 10*3/uL (ref 150–400)
RBC: 2.74 MIL/uL — ABNORMAL LOW (ref 4.22–5.81)
RDW: 14.4 % (ref 11.5–15.5)
WBC: 14.7 10*3/uL — ABNORMAL HIGH (ref 4.0–10.5)

## 2017-06-24 LAB — BASIC METABOLIC PANEL
Anion gap: 4 — ABNORMAL LOW (ref 5–15)
BUN: 28 mg/dL — ABNORMAL HIGH (ref 6–20)
CALCIUM: 8.7 mg/dL — AB (ref 8.9–10.3)
CHLORIDE: 108 mmol/L (ref 101–111)
CO2: 26 mmol/L (ref 22–32)
CREATININE: 1.33 mg/dL — AB (ref 0.61–1.24)
GFR calc Af Amer: 50 mL/min — ABNORMAL LOW (ref >60)
GFR calc non Af Amer: 43 mL/min — ABNORMAL LOW (ref >60)
GLUCOSE: 90 mg/dL (ref 65–99)
Potassium: 3.6 mmol/L (ref 3.5–5.1)
Sodium: 138 mmol/L (ref 135–145)

## 2017-06-24 LAB — EXPECTORATED SPUTUM ASSESSMENT W GRAM STAIN, RFLX TO RESP C

## 2017-06-24 MED ORDER — CEFPODOXIME PROXETIL 200 MG PO TABS
200.0000 mg | ORAL_TABLET | Freq: Two times a day (BID) | ORAL | Status: DC
  Administered 2017-06-24 – 2017-06-25 (×3): 200 mg via ORAL
  Filled 2017-06-24 (×3): qty 1

## 2017-06-24 NOTE — Clinical Social Work Note (Signed)
Clinical Social Work Assessment  Patient Details  Name: Carlos Morton MRN: [id number] Date of Birth: 10/11/1916  Date of referral:  06/24/17               Reason for consult:  Discharge Planning                Permission sought to share information with:  Facility Industrial/product designerContact Representative Permission granted to share information::     Name::        Agency::  [facility]  Relationship::     Contact Information:     Housing/Transportation Living arrangements for the past 2 months:  Assisted Living Facility([facility]) Source of Information:  Adult Children(Daughter Carlos MoorKathy Morton   [phone]) Patient Interpreter Needed:  None Criminal Activity/Legal Involvement Pertinent to Current Situation/Hospitalization:  No - Comment as needed Significant Relationships:  Adult Children Lives with:  Facility Resident Do you feel safe going back to the place where you live?  Yes Need for family participation in patient care:  Yes (Comment)  Care giving concerns:  Patient from [facility]. Per patient's ALF, patient required assistance with bathing and dressing prior to hospitalization. Staff reported that patient used a walker for assistance with ambulation. PT recommended HHPT.    Social Worker assessment / plan:  CSW spoke with patient's daughter regarding discharge planning. Patient's daughter reported that [facility]x. [facility] and that the plan is for patient to return. Patient's daughter reported that patient will need PTAR for transport.  CSW contact [facility] and spoke with staff member Rosey Batheresa. Staff reported that patient can return at discharge.  CSW will complete patient's discharge and continue to follow and assist with discharge planning.  Employment status:  Retired Health and safety inspectornsurance information:  Medicare PT Recommendations:  Home with Home Health Information / Referral to community resources:  ([facility])  Patient/Family's Response to care:  Patient's daughter agreeable to patient returning to [facility]. Patient's daughter appreciative of CSW assistance with discharge planning.  Patient/Family's Understanding of and Emotional Response to Diagnosis, Current Treatment, and Prognosis:  Patient oriented x person and unable to participate in assessment. Patient's daughter involved in patient's care and verbalized plan for patient to discharge back to current ALF.    Emotional Assessment Appearance:    Attitude/Demeanor/Rapport:  Unable to Assess Affect (typically observed):  Unable to Assess Orientation:  Oriented to Self Alcohol / Substance use:  Not Applicable Psych involvement (Current and /or in the community):  No (Comment)  Discharge Needs  Concerns to be addressed:  Care Coordination Readmission within the last 30 days:  No Current discharge risk:  None Barriers to Discharge:  Continued Medical Work up   USG CorporationKimberly L Jibreel Fedewa, LCSW 06/24/2017, 1:59 PM

## 2017-06-24 NOTE — Progress Notes (Signed)
PROGRESS NOTE    Carlos Morton  ZOX:[id number] DOB: [birth year] DOA: 06/21/2017 PCP: Karle Plumber, MD  Brief Narrative:Carlos Morton is a 83 y.o. male presented to ED from [facility] with altered mental status, has chronic foley.  Pt was recently rx'd for UTI and cough. Has had progressive lethargy and confusion at the SNF.  No dementia at baseline.  Has hx DM.  Presenting now to ED with WBC 23k , pyuria and confusion. Has had a persistent cough.  CXR showed focal infiltrates at the R base > L base.  He has rec'd IV abx for PNA and UTI   Assessment & Plan:   -Healthcare associated pneumonia versus aspiration -Blood cx NGTD -DC vancomycin, day 2 of cefepime -SLP evaluation completed, no overt aspiration -change to Po cefpodoxime -ambulate, PT/OT eval -DC planning  Suspected UTI -UA appears abnormal and some symptoms of dysuria and odor -Urine cx polymicrobial, abx as above -chronic foley, last changed 2 weeks ago  Acute kidney injury, creatinine of 1.75 likely due to sepsis and ACE inhibitor -improved, stop IVF, ACE on hold  COPD/chronic respiratory failure on home O2 -Stable, nebs when necessary, antibiotics for pneumonia as above - not wheezing currently-  Chronic systolic CHF -Last echocardiogram with EF of 35-40% -Holding Lasix and ACE inhibitor in the setting of sepsis and acute kidney injury -Resume tomorrow   Chronic atrial fibrillation -In sinus rhythm heart rate controlled at this time -Not on any beta blockers -Not on anticoagulation likely due to advanced age and fall risk  Diabetes mellitus -Hold Lantus and oral hypoglycemics -Sliding-scale insulin for now  DVT prophylaxis: lovenox Code Status: DNR Family Communication: none at bedside Disposition Plan: back to ALF with [facility] services tomorrow     Procedures:   Antimicrobials:  Anti-infectives (From admission, onward)   Start     Dose/Rate Route Frequency Ordered Stop   06/24/17 1200  cefpodoxime  (VANTIN) tablet 200 mg     200 mg Oral Every 12 hours 06/24/17 1033     06/23/17 2200  levofloxacin (LEVAQUIN) IVPB 750 mg  Status:  Discontinued     750 mg 100 mL/hr over 90 Minutes Intravenous Every 48 hours 06/22/17 0526 06/22/17 1354   06/22/17 1800  ceFEPIme (MAXIPIME) 1 g in dextrose 5 % 50 mL IVPB  Status:  Discontinued     1 g 100 mL/hr over 30 Minutes Intravenous Every 24 hours 06/22/17 1354 06/24/17 1033   06/22/17 0600  aztreonam (AZACTAM) 1 g in dextrose 5 % 50 mL IVPB  Status:  Discontinued     1 g 100 mL/hr over 30 Minutes Intravenous Every 8 hours 06/22/17 0526 06/22/17 1354   06/21/17 2215  levofloxacin (LEVAQUIN) IVPB 750 mg  Status:  Discontinued     750 mg 100 mL/hr over 90 Minutes Intravenous  Once 06/21/17 2201 06/21/17 2201   06/21/17 2215  aztreonam (AZACTAM) 2 g in dextrose 5 % 50 mL IVPB  Status:  Discontinued     2 g 100 mL/hr over 30 Minutes Intravenous  Once 06/21/17 2201 06/21/17 2201   06/21/17 2215  vancomycin (VANCOCIN) IVPB 1000 mg/200 mL premix  Status:  Discontinued     1,000 mg 200 mL/hr over 60 Minutes Intravenous  Once 06/21/17 2201 06/21/17 2201   06/21/17 2215  aztreonam (AZACTAM) 2 g in dextrose 5 % 50 mL IVPB     2 g 100 mL/hr over 30 Minutes Intravenous  Once 06/21/17 2202 06/21/17 2319   06/21/17 2215  vancomycin (VANCOCIN) IVPB 1000 mg/200 mL premix     1,000 mg 200 mL/hr over 60 Minutes Intravenous  Once 06/21/17 2202 06/22/17 0057   06/21/17 2215  levofloxacin (LEVAQUIN) IVPB 750 mg     750 mg 100 mL/hr over 90 Minutes Intravenous  Once 06/21/17 2207 06/22/17 0043       Subjective: -feels better, improving  Objective: Vitals:   06/23/17 2101 06/24/17 0628 06/24/17 1037 06/24/17 1255  BP: (!) 113/54 (!) 91/55 (!) 107/54 (!) 129/56  Pulse: 61 64 80 81  Resp: 18 18 18  (!) 22  Temp: 98.4 F (36.9 C) 98.9 F (37.2 C)  98 F (36.7 C)  TempSrc: Oral Oral  Oral  SpO2: 99% 95% 96% 98%  Weight:      Height:        Intake/Output  Summary (Last 24 hours) at 06/24/2017 1410 Last data filed at 06/24/2017 [id number] Gross per 24 hour  Intake 170 ml  Output 725 ml  Net -555 ml   Filed Weights   06/21/17 2015  Weight: 66.2 kg (146 lb)    Examination:  Gen: Awake, Alert, Oriented X 3,  HEENT: PERRLA,  Lungs: few basilar ronchi CVS: RRR,No Gallops,Rubs or new Murmurs Abd: soft, Non tender, non distended, BS present Extremities: No Cyanosis, Clubbing or edema Skin: no new rashes Chronic foley    Data Reviewed:   CBC: Recent Labs  Lab 06/21/17 2145 06/23/17 0601 06/24/17 0413  WBC 23.7* 9.7 14.7*  NEUTROABS 21.9*  --   --   HGB 9.7* 7.8* 8.2*  HCT 30.2* 23.6* 25.3*  MCV 92.6 91.1 92.3  PLT 227 151 175   Basic Metabolic Panel: Recent Labs  Lab 06/21/17 2145 06/23/17 0601 06/24/17 0413  NA 135 136 138  K 5.0 4.0 3.6  CL 102 106 108  CO2 23 24 26   GLUCOSE 191* 190* 90  BUN 41* 30* 28*  CREATININE 1.75* 1.29* 1.33*  CALCIUM 9.0 8.4* 8.7*   GFR: Estimated Creatinine Clearance: 29 mL/min (A) (by C-G formula based on SCr of 1.33 mg/dL (H)). Liver Function Tests: Recent Labs  Lab 06/21/17 2145  AST 19  ALT 9*  ALKPHOS 50  BILITOT 0.8  PROT 6.4*  ALBUMIN 3.4*   No results for input(s): LIPASE, AMYLASE in the last 168 hours. Recent Labs  Lab 06/21/17 2145  AMMONIA 17   Coagulation Profile: No results for input(s): INR, PROTIME in the last 168 hours. Cardiac Enzymes: No results for input(s): CKTOTAL, CKMB, CKMBINDEX, TROPONINI in the last 168 hours. BNP (last 3 results) No results for input(s): PROBNP in the last 8760 hours. HbA1C: No results for input(s): HGBA1C in the last 72 hours. CBG: Recent Labs  Lab 06/21/17 2128  GLUCAP 212*   Lipid Profile: No results for input(s): CHOL, HDL, LDLCALC, TRIG, CHOLHDL, LDLDIRECT in the last 72 hours. Thyroid Function Tests: No results for input(s): TSH, T4TOTAL, FREET4, T3FREE, THYROIDAB in the last 72 hours. Anemia Panel: No results for  input(s): VITAMINB12, FOLATE, FERRITIN, TIBC, IRON, RETICCTPCT in the last 72 hours. Urine analysis:    Component Value Date/Time   COLORURINE YELLOW 06/21/2017 2157   APPEARANCEUR CLOUDY (A) 06/21/2017 2157   LABSPEC 1.010 06/21/2017 2157   PHURINE 7.0 06/21/2017 2157   GLUCOSEU NEGATIVE 06/21/2017 2157   HGBUR SMALL (A) 06/21/2017 2157   BILIRUBINUR NEGATIVE 06/21/2017 2157   KETONESUR NEGATIVE 06/21/2017 2157   PROTEINUR NEGATIVE 06/21/2017 2157   UROBILINOGEN 1.0 12/29/2013 0201   NITRITE NEGATIVE 06/21/2017  2157   LEUKOCYTESUR MODERATE (A) 06/21/2017 2157   Sepsis Labs: @LABRCNTIP (procalcitonin:4,lacticidven:4)  ) Recent Results (from the past 240 hour(s))  Blood culture (routine x 2)     Status: None (Preliminary result)   Collection Time: 06/21/17 10:19 PM  Result Value Ref Range Status   Specimen Description BLOOD RIGHT ANTECUBITAL  Final   Special Requests   Final    BOTTLES DRAWN AEROBIC AND ANAEROBIC Blood Culture adequate volume   Culture   Final    NO GROWTH 1 DAY Performed at [facility], [address] [place], [place] [address]    Report Status PENDING  Incomplete  Blood culture (routine x 2)     Status: None (Preliminary result)   Collection Time: 06/21/17 10:27 PM  Result Value Ref Range Status   Specimen Description BLOOD RIGHT FOREARM  Final   Special Requests   Final    BOTTLES DRAWN AEROBIC AND ANAEROBIC Blood Culture adequate volume   Culture   Final    NO GROWTH 1 DAY Performed at [facility], [address] [place], [place] [address]    Report Status PENDING  Incomplete  Culture, Urine     Status: Abnormal   Collection Time: 06/22/17 12:43 PM  Result Value Ref Range Status   Specimen Description URINE, RANDOM  Final   Special Requests Immunocompromised  Final   Culture MULTIPLE SPECIES PRESENT, SUGGEST RECOLLECTION (A)  Final   Report Status 06/23/2017 FINAL  Final  MRSA PCR Screening     Status: None   Collection Time:  06/22/17  4:19 PM  Result Value Ref Range Status   MRSA by PCR NEGATIVE NEGATIVE Final    Comment:        The GeneXpert MRSA Assay (FDA approved for NASAL specimens only), is one component of a comprehensive MRSA colonization surveillance program. It is not intended to diagnose MRSA infection nor to guide or monitor treatment for MRSA infections.          Radiology Studies: No results found.      Scheduled Meds: . aspirin EC  81 mg Oral Daily  . cefpodoxime  200 mg Oral Q12H  . enoxaparin (LOVENOX) injection  30 mg Subcutaneous Q24H  . finasteride  5 mg Oral Daily  . gabapentin  100 mg Oral QHS  . ipratropium-albuterol  3 mL Nebulization TID  . levothyroxine  125 mcg Oral QAC breakfast   Continuous Infusions:    LOS: 2 days    Time spent: 35min    Zannie CovePreetha Faiz Weber, MD [facility] Page via [email or web address], password [email or web address] After 7PM please contact night-coverage  06/24/2017, 2:10 PM

## 2017-06-24 NOTE — Progress Notes (Signed)
Physical Therapy Treatment Patient Details Name: Carlos LevyClayton Morton MRN: [id number] DOB: 04-14-1917 Today's Date: 06/24/2017    History of Present Illness Pt was admitted for AMS, Pna.  PMH:  CHF, DM    PT Comments    Progressing slowly with mobility. Min encouragement for ambulation on today. End of session vitals: 94% on 2L [place] O2, HR 84 bpm.    Follow Up Recommendations  Home health PT(at ALF)     Equipment Recommendations  None recommended by PT    Recommendations for Other Services       Precautions / Restrictions Precautions Precautions: Fall Precaution Comments: O2 during day Restrictions Weight Bearing Restrictions: No    Mobility  Bed Mobility               General bed mobility comments: oob in recliner  Transfers Overall transfer level: Needs assistance Equipment used: 4-wheeled walker Transfers: Sit to/from Stand Sit to Stand: Min assist         General transfer comment: Assist to rise, steady. cues for safety, hand placement. Pt unable to rise unassisted  Ambulation/Gait Ambulation/Gait assistance: Min assist Ambulation Distance (Feet): 65 Feet Assistive device: 4-wheeled walker Gait Pattern/deviations: Step-through pattern;Decreased stride length     General Gait Details: assist to steady intermittent. remained on  o2. dypsnea 2/4. Pt c/o fatigue and nausea   Stairs            Wheelchair Mobility    Modified Rankin (Stroke Patients Only)       Balance Overall balance assessment: Needs assistance         Standing balance support: Bilateral upper extremity supported Standing balance-Leahy Scale: Poor Standing balance comment: requires RW                            Cognition Arousal/Alertness: Awake/alert Behavior During Therapy: WFL for tasks assessed/performed Overall Cognitive Status: History of cognitive impairments - at baseline                                 General Comments: appears  mostly wfl      Exercises      General Comments        Pertinent Vitals/Pain Pain Assessment: No/denies pain    Home Living                      Prior Function            PT Goals (current goals can now be found in the care plan section) Progress towards PT goals: Progressing toward goals    Frequency    Min 3X/week      PT Plan Current plan remains appropriate    Co-evaluation              AM-PAC PT "6 Clicks" Daily Activity  Outcome Measure  Difficulty turning over in bed (including adjusting bedclothes, sheets and blankets)?: A Little Difficulty moving from lying on back to sitting on the side of the bed? : A Little Difficulty sitting down on and standing up from a chair with arms (e.g., wheelchair, bedside commode, etc,.)?: Unable Help needed moving to and from a bed to chair (including a wheelchair)?: A Little Help needed walking in hospital room?: A Little Help needed climbing 3-5 steps with a railing? : A Lot 6 Click Score: 15    End of Session Equipment  Utilized During Treatment: Oxygen Activity Tolerance: Patient tolerated treatment well Patient left: in chair;with call bell/phone within reach;with chair alarm set(with RT)   PT Visit Diagnosis: Muscle weakness (generalized) (M62.81);Other abnormalities of gait and mobility (R26.89)     Time: 1610-96041423-1434 PT Time Calculation (min) (ACUTE ONLY): 11 min  Charges:  $Gait Training: 8-22 mins                    G Codes:          Rebeca AlertJannie Parv Manthey, MPT Pager: [phone]

## 2017-06-25 DIAGNOSIS — N179 Acute kidney failure, unspecified: Secondary | ICD-10-CM

## 2017-06-25 LAB — CBC
HEMATOCRIT: 25.6 % — AB (ref 39.0–52.0)
HEMOGLOBIN: 8.3 g/dL — AB (ref 13.0–17.0)
MCH: 29.9 pg (ref 26.0–34.0)
MCHC: 32.4 g/dL (ref 30.0–36.0)
MCV: 92.1 fL (ref 78.0–100.0)
PLATELETS: 166 10*3/uL (ref 150–400)
RBC: 2.78 MIL/uL — AB (ref 4.22–5.81)
RDW: 14.4 % (ref 11.5–15.5)
WBC: 11.6 10*3/uL — AB (ref 4.0–10.5)

## 2017-06-25 MED ORDER — CEFPODOXIME PROXETIL 200 MG PO TABS: 200.0000 mg | ORAL_TABLET | Freq: Two times a day (BID) | ORAL | 0 refills | Status: DC

## 2017-06-25 NOTE — Care Management Note (Signed)
Case Management Note  Patient Details  Name: Carlos LevyClayton Morton MRN: [id number] Date of Birth: 28-Apr-1917  Subjective/Objective:  Pt admitted with PNA                  Action/Plan: ALF/[facility]   Expected Discharge Date:  06/25/17               Expected Discharge Plan:  Assisted Living / Rest Home(HH)  In-House Referral:     Discharge planning Services     Post Acute Care Choice:  Home Health, Resumption of Svcs/PTA Provider Choice offered to:     DME Arranged:    DME Agency:     HH Arranged:  PT, OT HH Agency:  [facility] ([facility])  Status of Service:  Completed, signed off  If discussed at [employer] Meetings, dates discussed:    Additional CommentsGeni Bers:  Kanitra Purifoy, RN 06/25/2017, 3:02 PM

## 2017-06-25 NOTE — Clinical Social Work Placement (Signed)
[facility] and patient returning. CSW sent clinical documents, Staff member Rosey Batheresa confirmed clinical documents received. Facility aware of patient's discharge and confirmed patient's ability to return. PTAR contacted, patient's family notified. Patient's RN can call report to [phone], packet complete. CSW signing off, no other needs identified.    CLINICAL SOCIAL WORK PLACEMENT  NOTE  Date:  06/25/2017  Patient Details  Name: Carlos Morton MRN: [id number] Date of Birth: February 24, 1917  Clinical Social Work is seeking post-discharge placement for this patient at the Assisted Living Facility level of care (*CSW will initial, date and re-position this form in  chart as items are completed):      Patient/family provided with [facility] Clinical Social Work Department's list of facilities offering this level of care within the geographic area requested by the patient (or if unable, by the patient's family).      Patient/family informed of their freedom to choose among providers that offer the needed level of care, that participate in Medicare, Medicaid or managed care program needed by the patient, have an available bed and are willing to accept the patient.      Patient/family informed of [facility]'s ownership interest in [facility] and [facility], as well as of the fact that they are under no obligation to receive care at these facilities.  PASRR submitted to EDS on       PASRR number received on       Existing PASRR number confirmed on       FL2 transmitted to all facilities in geographic area requested by pt/family on 06/25/17     FL2 transmitted to all facilities within larger geographic area on       Patient informed that his/her managed care company has contracts with or will negotiate with certain facilities, including the following:        Yes   Patient/family informed of bed offers received.  [facility]      Physician recommends and patient chooses bed at      [facility].  Patient to be transferred to facility by PTAR     Patient family notified on 06/25/17 of transfer.  Name of family member notified:  Carlos Morton     PHYSICIAN       Additional Comment:    _______________________________________________ Carlos PolesKimberly L Varonica Siharath, LCSW 06/25/2017, 2:23 PM

## 2017-06-25 NOTE — Care Management Important Message (Signed)
Important Message  Patient Details  Name: Carlos Morton MRN: [id number] Date of Birth: Feb 22, 1917   Medicare Important Message Given:  Yes    Caren MacadamFuller, Nesta Kimple 06/25/2017, 11:51 AMImportant Message  Patient Details  Name: Carlos Morton MRN: [id number] Date of Birth: Feb 22, 1917   Medicare Important Message Given:  Yes    Caren MacadamFuller, Mauri Temkin 06/25/2017, 11:51 AM

## 2017-06-25 NOTE — Progress Notes (Signed)
Occupational Therapy Treatment Patient Details Name: Carlos LevyClayton Seabrooks MRN: [id number] DOB: 1917-05-18 Today's Date: 06/25/2017    History of present illness Pt was admitted for AMS, Pna.  PMH:  CHF, DM   OT comments  Progressing slowly.  Pt seemed more confused about environment this pm than upon evaluation.  Assisted with dressing, getting up to chair and setting up his lunch tray  Follow Up Recommendations  Home health OT(assist for adls/mobility)    Equipment Recommendations  None recommended by OT    Recommendations for Other Services      Precautions / Restrictions Precautions Precautions: Fall Precaution Comments: O2 during day Restrictions Weight Bearing Restrictions: No       Mobility Bed Mobility                  Transfers       Sit to Stand: Min assist Stand pivot transfers: Min guard       General transfer comment: light assistance to rise and close supervision for transfer for safety    Balance                                           ADL either performed or assessed with clinical judgement   ADL   Eating/Feeding: Set up;Sitting               Upper Body Dressing : Minimal assistance;Sitting   Lower Body Dressing: Maximal assistance;Sit to/from stand   Toilet Transfer: Minimal assistance;RW(to chair)             General ADL Comments: helped pt don disposable scrubs.  Pt needs assistance with UB to orient due to decreased vision.  Set up tray for lunch.     Vision       Perception     Praxis      Cognition Arousal/Alertness: Awake/alert Behavior During Therapy: WFL for tasks assessed/performed                                   General Comments: pt was a little more confused this after than at time of evaluation.  Asking for his walker ([facility]) to control his chair.  Forgot that I told him the chair he was sitting in was the hospitals        Exercises     Shoulder  Instructions       General Comments      Pertinent Vitals/ Pain          Home Living                                          Prior Functioning/Environment              Frequency  Min 2X/week        Progress Toward Goals  OT Goals(current goals can now be found in the care plan section)  Progress towards OT goals: Progressing toward goals     Plan      Co-evaluation                 AM-PAC PT "6 Clicks" Daily Activity     Outcome Measure   Help from another person eating meals?:  A Little Help from another person taking care of personal grooming?: A Little Help from another person toileting, which includes using toliet, bedpan, or urinal?: A Little Help from another person bathing (including washing, rinsing, drying)?: A Lot Help from another person to put on and taking off regular upper body clothing?: A Little Help from another person to put on and taking off regular lower body clothing?: A Lot 6 Click Score: 16    End of Session    OT Visit Diagnosis: Muscle weakness (generalized) (M62.81)   Activity Tolerance Patient tolerated treatment well   Patient Left in chair;with call bell/phone within reach;with chair alarm set   Nurse Communication          Time: [id number] OT Time Calculation (min): 18 min  Charges: OT General Charges $OT Visit: 1 Visit OT Treatments $Self Care/Home Management : 8-22 mins  Marica OtterMaryellen Danay Mckellar, OTR/L [phone] 06/25/2017   Tareva Leske 06/25/2017, 2:49 PM

## 2017-06-25 NOTE — NC FL2 (Signed)
[place] MEDICAID FL2 LEVEL OF CARE SCREENING TOOL     IDENTIFICATION  Patient Name: Carlos Morton Birthdate: 1917/04/30 Sex: male Admission Date (Current Location): 06/21/2017  [facility] [place]   Facility and Address:  [facility],  [address], [place] [address]      Provider Number: [id number]  Attending Physician Name and Address:  Zannie CoveJoseph, Preetha, MD  Relative Name and Phone Number:       Current Level of Care: Hospital Recommended Level of Care: Assisted Living Facility Prior Approval Number:    Date Approved/Denied:   PASRR Number:    Discharge Plan: Other (Comment)(Assisted Living Facility)    Current Diagnoses: Patient Active Problem List   Diagnosis Date Noted  . AKI (acute kidney injury) (HCC)   . PNA (pneumonia) 06/22/2017  . Acute lower UTI 06/22/2017  . Leukocytosis 06/22/2017  . Cough 06/22/2017  . Chronic systolic CHF (congestive heart failure) (HCC) 06/22/2017  . COPD (chronic obstructive pulmonary disease) (HCC) 06/22/2017  . On home oxygen therapy 06/22/2017  . DNR (do not resuscitate) 06/22/2017  . Palliative care encounter 01/03/2014  . SIRS (systemic inflammatory response syndrome) (HCC) 12/29/2013  . CHF (congestive heart failure) (HCC) 06/21/2013  . Bradycardia 10/27/2011  . DM (diabetes mellitus), type 2 (HCC) 10/22/2011  . CHF exacerbation (HCC) 10/22/2011  . CAD (coronary artery disease), native coronary artery 10/22/2011  . COPD bronchitis 10/22/2011  . Hypothyroidism (acquired) 10/22/2011  . Atrial fibrillation with controlled ventricular response (HCC) 10/22/2011    Orientation RESPIRATION BLADDER Height & Weight     Self, Time  O2 Indwelling catheter Weight: 146 lb (66.2 kg) Height:  5\' 8"  (172.7 cm)  BEHAVIORAL SYMPTOMS/MOOD NEUROLOGICAL BOWEL NUTRITION STATUS        Diet(regular)  AMBULATORY STATUS COMMUNICATION OF NEEDS Skin   Limited Assist Verbally Normal                        Personal Care Assistance Level of Assistance  Bathing, Feeding, Dressing Bathing Assistance: Maximum assistance Feeding assistance: Independent Dressing Assistance: Maximum assistance     Functional Limitations Info  Sight, Hearing, Speech Sight Info: Adequate Hearing Info: Impaired Speech Info: Adequate    SPECIAL CARE FACTORS FREQUENCY  PT (By licensed PT), OT (By licensed OT)     PT Frequency: HHPT OT Frequency: HHOT            Contractures Contractures Info: Not present    Additional Factors Info  Code Status, Allergies Code Status Info: DNR Allergies Info: Codeine;Demerol Meperidine;Diltiazem;Penicillins;Sulfa Drugs Cross Reactors              Discharge Medications:  Medication List     STOP taking these medications   insulin glargine 100 UNIT/ML injection Commonly known as:  LANTUS   lisinopril 2.5 MG tablet Commonly known as:  PRINIVIL,ZESTRIL   LORazepam 0.5 MG tablet Commonly known as:  ATIVAN     TAKE these medications   acetaminophen 325 MG tablet Commonly known as:  TYLENOL Take 2 tablets (650 mg total) by mouth every 6 (six) hours as needed for mild pain, moderate pain, fever or headache. What changed:  when to take this   albuterol (2.5 MG/3ML) 0.083% nebulizer solution Commonly known as:  PROVENTIL Take 2.5 mg by nebulization every 6 (six) hours as needed for wheezing or shortness of breath.   allopurinol 100 MG tablet Commonly known as:  ZYLOPRIM Take 200 mg by mouth daily.  aspirin EC 81 MG tablet Take 81 mg by mouth daily.   cefpodoxime 200 MG tablet Commonly known as:  VANTIN Take 1 tablet (200 mg total) by mouth every 12 (twelve) hours. For 3days   celecoxib 100 MG capsule Commonly known as:  CELEBREX Take 100 mg by mouth 2 (two) times daily.   ferrous sulfate 325 (65 FE) MG EC tablet Take 325 mg by mouth daily with breakfast.   finasteride 5 MG tablet Commonly known as:  PROSCAR Take 1 tablet (5  mg total) by mouth daily.   furosemide 20 MG tablet Commonly known as:  LASIX Take 1 tablet (20 mg total) by mouth 2 (two) times daily.   gabapentin 100 MG capsule Commonly known as:  NEURONTIN Take 100 mg by mouth at bedtime.   glipiZIDE 10 MG 24 hr tablet Commonly known as:  GLUCOTROL XL Take 10 mg by mouth daily with breakfast.   JOINTFLEX 3.1 % Crea Generic drug:  Camphor Apply 1 application topically 2 (two) times daily as needed (pain).   levothyroxine 125 MCG tablet Commonly known as:  SYNTHROID, LEVOTHROID Take 125 mcg by mouth daily before breakfast.   metFORMIN 1000 MG tablet Commonly known as:  GLUCOPHAGE Take 1,000 mg by mouth 2 (two) times daily with a meal.   polyethylene glycol packet Commonly known as:  MIRALAX / GLYCOLAX Take 17 g by mouth 2 (two) times daily as needed for mild constipation.   potassium chloride SA 20 MEQ tablet Commonly known as:  K-DUR,KLOR-CON Take 20 mEq by mouth daily.   predniSONE 5 MG tablet Commonly known as:  DELTASONE Take 5 mg by mouth daily with breakfast.   senna 8.6 MG Tabs tablet Commonly known as:  SENOKOT Take 1 tablet by mouth at bedtime.   SYSTANE OP Apply 1-2 drops to eye daily as needed (dry eyes).   traZODone 50 MG tablet Commonly known as:  DESYREL Take 50 mg by mouth at bedtime.        Relevant Imaging Results:  Relevant Lab Results:   Additional Information SSN   [id number]      [facility]  Antionette PolesKimberly L Jamaia Brum, LCSW

## 2017-06-25 NOTE — Progress Notes (Signed)
[facility], [place] AltFelicia Williams, RN

## 2017-06-25 NOTE — Discharge Summary (Addendum)
Physician Discharge Summary  Carlos Morton WUJ:[id number] DOB: 09/30/1916 DOA: 06/21/2017  PCP: Karle Plumber, MD  Admit date: 06/21/2017 Discharge date: 06/25/2017  Time spent:  Recommendations for Outpatient Follow-up:  PCP in 1 week FOley Care please ensure this gets changed every 4 weeks, next change needed in 1 week  Discharge Diagnoses:  Principal Problem:   Health care associated pneumonia   Urinary retention-chronic foley   DM (diabetes mellitus), type 2 (HCC)   SIRS (systemic inflammatory response syndrome) (HCC)   Leukocytosis   Cough   Chronic systolic CHF (congestive heart failure) (HCC)   COPD (chronic obstructive pulmonary disease) (HCC)   On home oxygen therapy   DNR (do not resuscitate)   AKI (acute kidney injury) (HCC)   Discharge Condition: stable  Diet recommendation: DM/heart healthy  Filed Weights   06/21/17 2015  Weight: 66.2 kg (146 lb)    History of present illness:  Carlos Kingis a 83 y.o.malepresented to ED from [facility] with altered mental status, has chronic foley. Pt was recently rx'd for UTI and cough. Has had progressive lethargy and confusion at the SNF. No dementia at baseline. Has hx DM. Presenting now to ED with WBC 23k , pyuria and confusion. Has had a persistent cough. CXR showed focal infiltrates at the R base >L base.  Hospital Course:   -Healthcare associated pneumonia  -Improved with broad-spectrum antibiotics,-Blood cx NGTD, afebrile and leukocytosis resolved now -SLP evaluation completed, no overt aspiration -Transitioned to oral cefpodoxime to complete antibiotic course and discharged back to assisted living facility with home health services  Suspected UTI/urinary retention-chronic Foley -UA appears abnormal and no symptoms of UTI -Urine cx polymicrobial, was treated with antibiotics for pneumonia which would've covered most urinary sources as well -chronic foley, last changed 2-3 weeks ago,  this needs to be changed in 1 week at his assisted living  Acute kidney injury, creatinine of 1.75 likely due to sepsis and ACE inhibitor -improved,  stopped IV fluids creatinine down to 1.3, did not restart lisinopril due to soft blood pressures  COPD/chronic respiratory failure on home O2 -Stable, nebs when necessary, antibiotics for pneumonia as above  Chronic systolic CHF -Last echocardiogram with EF of 35-40% -Compensated -Restarted Lasix, and did not resume lisinopril due to soft blood pressures  Chronic atrial fibrillation -In sinus rhythm heart rate controlled at this time -Not on any beta blockers -Not on anticoagulation likely due to advanced age and fall risk  Diabetes mellitus -Presumed oral hypoglycemics, was not using Lantus prior to admission    Discharge Exam: Vitals:   06/25/17 0416 06/25/17 1019  BP: (!) 114/49   Pulse: 74   Resp: 18   Temp: (!) 97.5 F (36.4 C)   SpO2: 94% 92%    General: AAOx3 Cardiovascular: S!S2/RRR Respiratory: few scattered ronchi-improved  Discharge Instructions   Discharge Instructions    Diet - low sodium heart healthy   Complete by:  As directed    Diet Carb Modified   Complete by:  As directed    Increase activity slowly   Complete by:  As directed      Allergies as of 06/25/2017      Reactions   Codeine Other (See Comments)   unknown   Demerol [meperidine] Other (See Comments)    Unknown per pt  mar   Diltiazem Other (See Comments)    Unknown per pt  mar   Penicillins Other (See Comments)    Unknown per pt  mar  Sulfa Drugs Cross Reactors Hives   [facility]      Medication List    STOP taking these medications   insulin glargine 100 UNIT/ML injection Commonly known as:  LANTUS   lisinopril 2.5 MG tablet Commonly known as:  PRINIVIL,ZESTRIL   LORazepam 0.5 MG tablet Commonly known as:  ATIVAN     TAKE these medications   acetaminophen 325 MG tablet Commonly known as:   TYLENOL Take 2 tablets (650 mg total) by mouth every 6 (six) hours as needed for mild pain, moderate pain, fever or headache. What changed:  when to take this   albuterol (2.5 MG/3ML) 0.083% nebulizer solution Commonly known as:  PROVENTIL Take 2.5 mg by nebulization every 6 (six) hours as needed for wheezing or shortness of breath.   allopurinol 100 MG tablet Commonly known as:  ZYLOPRIM Take 200 mg by mouth daily.   aspirin EC 81 MG tablet Take 81 mg by mouth daily.   cefpodoxime 200 MG tablet Commonly known as:  VANTIN Take 1 tablet (200 mg total) by mouth every 12 (twelve) hours. For 3days   celecoxib 100 MG capsule Commonly known as:  CELEBREX Take 100 mg by mouth 2 (two) times daily.   ferrous sulfate 325 (65 FE) MG EC tablet Take 325 mg by mouth daily with breakfast.   finasteride 5 MG tablet Commonly known as:  PROSCAR Take 1 tablet (5 mg total) by mouth daily.   furosemide 20 MG tablet Commonly known as:  LASIX Take 1 tablet (20 mg total) by mouth 2 (two) times daily.   gabapentin 100 MG capsule Commonly known as:  NEURONTIN Take 100 mg by mouth at bedtime.   glipiZIDE 10 MG 24 hr tablet Commonly known as:  GLUCOTROL XL Take 10 mg by mouth daily with breakfast.   JOINTFLEX 3.1 % Crea Generic drug:  Camphor Apply 1 application topically 2 (two) times daily as needed (pain).   levothyroxine 125 MCG tablet Commonly known as:  SYNTHROID, LEVOTHROID Take 125 mcg by mouth daily before breakfast.   metFORMIN 1000 MG tablet Commonly known as:  GLUCOPHAGE Take 1,000 mg by mouth 2 (two) times daily with a meal.   polyethylene glycol packet Commonly known as:  MIRALAX / GLYCOLAX Take 17 g by mouth 2 (two) times daily as needed for mild constipation.   potassium chloride SA 20 MEQ tablet Commonly known as:  K-DUR,KLOR-CON Take 20 mEq by mouth daily.   predniSONE 5 MG tablet Commonly known as:  DELTASONE Take 5 mg by mouth daily with breakfast.   senna  8.6 MG Tabs tablet Commonly known as:  SENOKOT Take 1 tablet by mouth at bedtime.   SYSTANE OP Apply 1-2 drops to eye daily as needed (dry eyes).   traZODone 50 MG tablet Commonly known as:  DESYREL Take 50 mg by mouth at bedtime.      Allergies  Allergen Reactions  . Codeine Other (See Comments)    unknown  . Demerol [Meperidine] Other (See Comments)     Unknown per pt  mar  . Diltiazem Other (See Comments)     Unknown per pt  mar  . Penicillins Other (See Comments)     Unknown per pt  mar  . Sulfa Drugs Cross Reactors Hives    [facility]   Follow-up Information    Karle PlumberArvind, Moogali M, MD. Schedule an appointment as soon as possible for a visit in 1 week(s).   Specialty:  Internal Medicine  Contact information: [address] [place] [address] [phone]            The results of significant diagnostics from this hospitalization (including imaging, microbiology, ancillary and laboratory) are listed below for reference.    Significant Diagnostic Studies: Dg Chest 2 View  Result Date: 06/21/2017 CLINICAL DATA:  Productive cough EXAM: CHEST  2 VIEW COMPARISON:  01/01/2014 FINDINGS: Elevation of the left diaphragm. No large pleural effusion. Right greater than left lung base pulmonary infiltrates. Enlarged cardiomediastinal silhouette. No pneumothorax. IMPRESSION: 1. Right greater than left bilateral lung base infiltrates. Radiographic follow-up to resolution recommended 2. Mild cardiomegaly. Electronically Signed   By: Jasmine PangKim  Fujinaga M.D.   On: 06/21/2017 21:48   Ct Head Wo Contrast  Result Date: 06/21/2017 CLINICAL DATA:  Altered mental status EXAM: CT HEAD WITHOUT CONTRAST TECHNIQUE: Contiguous axial images were obtained from the base of the skull through the vertex without intravenous contrast. COMPARISON:  None. FINDINGS: Brain: Motion degradation. No gross acute territorial infarction, hemorrhage, or intracranial mass is visualized. Mild to moderate small  vessel ischemic changes of the white matter. Moderate atrophy. Old lacunar infarct in the right white matter. Vascular: No hyperdense vessels.  Carotid artery calcification. Skull: Mastoid sclerosis and minimal fluid on the left.  No fracture Sinuses/Orbits: Moderate mucosal thickening in the maxillary, sphenoid and ethmoid sinuses. No acute orbital abnormality. Other: None IMPRESSION: 1. Motion limits the study 2. No definite CT evidence for acute intracranial abnormality. Atrophy and small vessel ischemic changes of the white matter Electronically Signed   By: Jasmine PangKim  Fujinaga M.D.   On: 06/21/2017 23:32    Microbiology: Recent Results (from the past 240 hour(s))  Blood culture (routine x 2)     Status: None (Preliminary result)   Collection Time: 06/21/17 10:19 PM  Result Value Ref Range Status   Specimen Description BLOOD RIGHT ANTECUBITAL  Final   Special Requests   Final    BOTTLES DRAWN AEROBIC AND ANAEROBIC Blood Culture adequate volume   Culture   Final    NO GROWTH 2 DAYS Performed at [facility], [address] [place], [place] [address]    Report Status PENDING  Incomplete  Blood culture (routine x 2)     Status: None (Preliminary result)   Collection Time: 06/21/17 10:27 PM  Result Value Ref Range Status   Specimen Description BLOOD RIGHT FOREARM  Final   Special Requests   Final    BOTTLES DRAWN AEROBIC AND ANAEROBIC Blood Culture adequate volume   Culture   Final    NO GROWTH 2 DAYS Performed at [facility], [address] [place], [place] [address]    Report Status PENDING  Incomplete  Culture, Urine     Status: Abnormal   Collection Time: 06/22/17 12:43 PM  Result Value Ref Range Status   Specimen Description URINE, RANDOM  Final   Special Requests Immunocompromised  Final   Culture MULTIPLE SPECIES PRESENT, SUGGEST RECOLLECTION (A)  Final   Report Status 06/23/2017 FINAL  Final  MRSA PCR Screening     Status: None   Collection Time: 06/22/17  4:19 PM   Result Value Ref Range Status   MRSA by PCR NEGATIVE NEGATIVE Final    Comment:        The GeneXpert MRSA Assay (FDA approved for NASAL specimens only), is one component of a comprehensive MRSA colonization surveillance program. It is not intended to diagnose MRSA infection nor to guide or monitor treatment  for MRSA infections.   Culture, sputum-assessment     Status: None   Collection Time: 06/24/17  4:43 PM  Result Value Ref Range Status   Specimen Description EXPECTORATED SPUTUM  Final   Special Requests NONE  Final   Sputum evaluation THIS SPECIMEN IS ACCEPTABLE FOR SPUTUM CULTURE  Final   Report Status 06/24/2017 FINAL  Final  Culture, respiratory (NON-Expectorated)     Status: None (Preliminary result)   Collection Time: 06/24/17  4:43 PM  Result Value Ref Range Status   Specimen Description EXPECTORATED SPUTUM  Final   Special Requests NONE Reflexed from [id number]  Final   Gram Stain   Final    MODERATE WBC PRESENT,BOTH PMN AND MONONUCLEAR FEW GRAM VARIABLE ROD RARE GRAM POSITIVE COCCI RARE YEAST Performed at [facility], [address], [place], [place] [address]    Culture PENDING  Incomplete   Report Status PENDING  Incomplete     Labs: Basic Metabolic Panel: Recent Labs  Lab 06/21/17 2145 06/23/17 0601 06/24/17 0413  NA 135 136 138  K 5.0 4.0 3.6  CL 102 106 108  CO2 23 24 26   GLUCOSE 191* 190* 90  BUN 41* 30* 28*  CREATININE 1.75* 1.29* 1.33*  CALCIUM 9.0 8.4* 8.7*   Liver Function Tests: Recent Labs  Lab 06/21/17 2145  AST 19  ALT 9*  ALKPHOS 50  BILITOT 0.8  PROT 6.4*  ALBUMIN 3.4*   No results for input(s): LIPASE, AMYLASE in the last 168 hours. Recent Labs  Lab 06/21/17 2145  AMMONIA 17   CBC: Recent Labs  Lab 06/21/17 2145 06/23/17 0601 06/24/17 0413 06/25/17 0435  WBC 23.7* 9.7 14.7* 11.6*  NEUTROABS 21.9*  --   --   --   HGB 9.7* 7.8* 8.2* 8.3*  HCT 30.2* 23.6* 25.3* 25.6*  MCV 92.6 91.1 92.3 92.1  PLT 227 151  175 166   Cardiac Enzymes: No results for input(s): CKTOTAL, CKMB, CKMBINDEX, TROPONINI in the last 168 hours. BNP: BNP (last 3 results) No results for input(s): BNP in the last 8760 hours.  ProBNP (last 3 results) No results for input(s): PROBNP in the last 8760 hours.  CBG: Recent Labs  Lab 06/21/17 2128  GLUCAP 212*       Signed:  Zannie Cove MD.  [facility] 06/25/2017, 11:26 AM

## 2017-06-27 LAB — CULTURE, RESPIRATORY W GRAM STAIN

## 2017-06-27 LAB — CULTURE, BLOOD (ROUTINE X 2)
Culture: NO GROWTH
Culture: NO GROWTH
Special Requests: ADEQUATE
Special Requests: ADEQUATE

## 2017-06-27 LAB — CULTURE, RESPIRATORY: CULTURE: NORMAL

## 2017-06-30 ENCOUNTER — Emergency Department (HOSPITAL_COMMUNITY): Payer: Medicare Other

## 2017-06-30 ENCOUNTER — Inpatient Hospital Stay (HOSPITAL_COMMUNITY)
Admission: EM | Admit: 2017-06-30 | Discharge: 2017-07-07 | DRG: 193 | Disposition: A | Discharge status: Nursing Home (Includes ICF) | Payer: Medicare Other | Attending: Internal Medicine | Admitting: Internal Medicine

## 2017-06-30 ENCOUNTER — Other Ambulatory Visit: Payer: Self-pay

## 2017-06-30 ENCOUNTER — Encounter (HOSPITAL_COMMUNITY): Payer: Self-pay | Admitting: Nurse Practitioner

## 2017-06-30 DIAGNOSIS — Z7982 Long term (current) use of aspirin: Secondary | ICD-10-CM

## 2017-06-30 DIAGNOSIS — Z794 Long term (current) use of insulin: Secondary | ICD-10-CM | POA: Diagnosis not present

## 2017-06-30 DIAGNOSIS — I4891 Unspecified atrial fibrillation: Secondary | ICD-10-CM | POA: Diagnosis present

## 2017-06-30 DIAGNOSIS — I482 Chronic atrial fibrillation: Secondary | ICD-10-CM | POA: Diagnosis present

## 2017-06-30 DIAGNOSIS — L89312 Pressure ulcer of right buttock, stage 2: Secondary | ICD-10-CM | POA: Diagnosis present

## 2017-06-30 DIAGNOSIS — D638 Anemia in other chronic diseases classified elsewhere: Secondary | ICD-10-CM | POA: Diagnosis present

## 2017-06-30 DIAGNOSIS — E1110 Type 2 diabetes mellitus with ketoacidosis without coma: Secondary | ICD-10-CM | POA: Diagnosis not present

## 2017-06-30 DIAGNOSIS — E039 Hypothyroidism, unspecified: Secondary | ICD-10-CM | POA: Diagnosis present

## 2017-06-30 DIAGNOSIS — E1122 Type 2 diabetes mellitus with diabetic chronic kidney disease: Secondary | ICD-10-CM | POA: Diagnosis present

## 2017-06-30 DIAGNOSIS — J9601 Acute respiratory failure with hypoxia: Secondary | ICD-10-CM | POA: Diagnosis present

## 2017-06-30 DIAGNOSIS — J449 Chronic obstructive pulmonary disease, unspecified: Secondary | ICD-10-CM | POA: Diagnosis not present

## 2017-06-30 DIAGNOSIS — Z79899 Other long term (current) drug therapy: Secondary | ICD-10-CM | POA: Diagnosis not present

## 2017-06-30 DIAGNOSIS — R627 Adult failure to thrive: Secondary | ICD-10-CM | POA: Diagnosis present

## 2017-06-30 DIAGNOSIS — M109 Gout, unspecified: Secondary | ICD-10-CM | POA: Diagnosis present

## 2017-06-30 DIAGNOSIS — I5022 Chronic systolic (congestive) heart failure: Secondary | ICD-10-CM | POA: Diagnosis present

## 2017-06-30 DIAGNOSIS — J189 Pneumonia, unspecified organism: Principal | ICD-10-CM | POA: Diagnosis present

## 2017-06-30 DIAGNOSIS — E876 Hypokalemia: Secondary | ICD-10-CM | POA: Diagnosis present

## 2017-06-30 DIAGNOSIS — N390 Urinary tract infection, site not specified: Secondary | ICD-10-CM | POA: Diagnosis not present

## 2017-06-30 DIAGNOSIS — Z96652 Presence of left artificial knee joint: Secondary | ICD-10-CM | POA: Diagnosis present

## 2017-06-30 DIAGNOSIS — Z9981 Dependence on supplemental oxygen: Secondary | ICD-10-CM | POA: Diagnosis not present

## 2017-06-30 DIAGNOSIS — Z7952 Long term (current) use of systemic steroids: Secondary | ICD-10-CM

## 2017-06-30 DIAGNOSIS — L8990 Pressure ulcer of unspecified site, unspecified stage: Secondary | ICD-10-CM

## 2017-06-30 DIAGNOSIS — N183 Chronic kidney disease, stage 3 unspecified: Secondary | ICD-10-CM | POA: Diagnosis present

## 2017-06-30 DIAGNOSIS — E119 Type 2 diabetes mellitus without complications: Secondary | ICD-10-CM

## 2017-06-30 DIAGNOSIS — Z515 Encounter for palliative care: Secondary | ICD-10-CM

## 2017-06-30 DIAGNOSIS — Z7984 Long term (current) use of oral hypoglycemic drugs: Secondary | ICD-10-CM

## 2017-06-30 DIAGNOSIS — G9340 Encephalopathy, unspecified: Secondary | ICD-10-CM | POA: Diagnosis present

## 2017-06-30 DIAGNOSIS — I2510 Atherosclerotic heart disease of native coronary artery without angina pectoris: Secondary | ICD-10-CM | POA: Diagnosis present

## 2017-06-30 DIAGNOSIS — Z7189 Other specified counseling: Secondary | ICD-10-CM

## 2017-06-30 DIAGNOSIS — Y95 Nosocomial condition: Secondary | ICD-10-CM | POA: Diagnosis present

## 2017-06-30 DIAGNOSIS — I5023 Acute on chronic systolic (congestive) heart failure: Secondary | ICD-10-CM | POA: Diagnosis present

## 2017-06-30 DIAGNOSIS — J440 Chronic obstructive pulmonary disease with acute lower respiratory infection: Secondary | ICD-10-CM | POA: Diagnosis present

## 2017-06-30 DIAGNOSIS — E7800 Pure hypercholesterolemia, unspecified: Secondary | ICD-10-CM | POA: Diagnosis present

## 2017-06-30 DIAGNOSIS — Z87891 Personal history of nicotine dependence: Secondary | ICD-10-CM

## 2017-06-30 DIAGNOSIS — Z66 Do not resuscitate: Secondary | ICD-10-CM | POA: Diagnosis present

## 2017-06-30 DIAGNOSIS — I130 Hypertensive heart and chronic kidney disease with heart failure and stage 1 through stage 4 chronic kidney disease, or unspecified chronic kidney disease: Secondary | ICD-10-CM | POA: Diagnosis present

## 2017-06-30 LAB — URINALYSIS, ROUTINE W REFLEX MICROSCOPIC
BILIRUBIN URINE: NEGATIVE
Glucose, UA: NEGATIVE mg/dL
Ketones, ur: NEGATIVE mg/dL
Nitrite: NEGATIVE
PH: 5 (ref 5.0–8.0)
Protein, ur: NEGATIVE mg/dL
SPECIFIC GRAVITY, URINE: 1.013 (ref 1.005–1.030)
Squamous Epithelial / LPF: NONE SEEN

## 2017-06-30 LAB — BASIC METABOLIC PANEL
ANION GAP: 8 (ref 5–15)
BUN: 24 mg/dL — ABNORMAL HIGH (ref 6–20)
CO2: 27 mmol/L (ref 22–32)
Calcium: 9 mg/dL (ref 8.9–10.3)
Chloride: 101 mmol/L (ref 101–111)
Creatinine, Ser: 1.31 mg/dL — ABNORMAL HIGH (ref 0.61–1.24)
GFR calc Af Amer: 50 mL/min — ABNORMAL LOW (ref >60)
GFR, EST NON AFRICAN AMERICAN: 44 mL/min — AB (ref >60)
Glucose, Bld: 179 mg/dL — ABNORMAL HIGH (ref 65–99)
POTASSIUM: 4.8 mmol/L (ref 3.5–5.1)
SODIUM: 136 mmol/L (ref 135–145)

## 2017-06-30 LAB — CBC WITH DIFFERENTIAL/PLATELET
BASOS ABS: 0 10*3/uL (ref 0.0–0.1)
Basophils Relative: 0 %
EOS ABS: 0.1 10*3/uL (ref 0.0–0.7)
EOS PCT: 1 %
HCT: 29 % — ABNORMAL LOW (ref 39.0–52.0)
Hemoglobin: 9.4 g/dL — ABNORMAL LOW (ref 13.0–17.0)
LYMPHS PCT: 10 %
Lymphs Abs: 1.4 10*3/uL (ref 0.7–4.0)
MCH: 29.9 pg (ref 26.0–34.0)
MCHC: 32.4 g/dL (ref 30.0–36.0)
MCV: 92.4 fL (ref 78.0–100.0)
Monocytes Absolute: 0.7 10*3/uL (ref 0.1–1.0)
Monocytes Relative: 5 %
Neutro Abs: 11.6 10*3/uL — ABNORMAL HIGH (ref 1.7–7.7)
Neutrophils Relative %: 84 %
PLATELETS: 252 10*3/uL (ref 150–400)
RBC: 3.14 MIL/uL — AB (ref 4.22–5.81)
RDW: 14.5 % (ref 11.5–15.5)
WBC: 13.8 10*3/uL — AB (ref 4.0–10.5)

## 2017-06-30 LAB — CBG MONITORING, ED: GLUCOSE-CAPILLARY: 82 mg/dL (ref 65–99)

## 2017-06-30 LAB — BRAIN NATRIURETIC PEPTIDE: B NATRIURETIC PEPTIDE 5: 1088.6 pg/mL — AB (ref 0.0–100.0)

## 2017-06-30 LAB — I-STAT CG4 LACTIC ACID, ED
Lactic Acid, Venous: 3.47 mmol/L (ref 0.5–1.9)
Lactic Acid, Venous: 0.81 mmol/L (ref 0.5–1.9)

## 2017-06-30 MED ORDER — FUROSEMIDE 40 MG PO TABS: 20.0000 mg | ORAL_TABLET | Freq: Two times a day (BID) | ORAL | Status: DC

## 2017-06-30 MED ORDER — GABAPENTIN 100 MG PO CAPS
100.0000 mg | ORAL_CAPSULE | Freq: Every day | ORAL | Status: DC
Start: 2017-07-01 — End: 2017-07-07
  Administered 2017-07-01 – 2017-07-06 (×7): 100 mg via ORAL
  Filled 2017-06-30 (×7): qty 1

## 2017-06-30 MED ORDER — CELECOXIB 100 MG PO CAPS: 100.0000 mg | ORAL_CAPSULE | Freq: Two times a day (BID) | ORAL | Status: DC

## 2017-06-30 MED ORDER — DEXTROSE 5 % IV SOLN: 2.0000 g | Freq: Once | INTRAVENOUS | Status: DC

## 2017-06-30 MED ORDER — POLYVINYL ALCOHOL 1.4 % OP SOLN: Freq: Every day | OPHTHALMIC | Status: DC | PRN

## 2017-06-30 MED ORDER — SENNA 8.6 MG PO TABS
1.0000 | ORAL_TABLET | Freq: Every day | ORAL | Status: DC
  Administered 2017-07-01 – 2017-07-06 (×7): 8.6 mg via ORAL
  Filled 2017-06-30 (×7): qty 1

## 2017-06-30 MED ORDER — ASPIRIN EC 81 MG PO TBEC
81.0000 mg | DELAYED_RELEASE_TABLET | Freq: Every day | ORAL | Status: DC
  Administered 2017-07-01 – 2017-07-07 (×7): 81 mg via ORAL
  Filled 2017-06-30 (×7): qty 1

## 2017-06-30 MED ORDER — DEXTROSE 5 % IV SOLN
1.0000 g | Freq: Two times a day (BID) | INTRAVENOUS | Status: DC
  Administered 2017-07-01 – 2017-07-06 (×11): 1 g via INTRAVENOUS
  Filled 2017-06-30 (×14): qty 1

## 2017-06-30 MED ORDER — ALLOPURINOL 100 MG PO TABS
200.0000 mg | ORAL_TABLET | Freq: Every day | ORAL | Status: DC
  Administered 2017-07-01 – 2017-07-07 (×7): 200 mg via ORAL
  Filled 2017-06-30 (×7): qty 2

## 2017-06-30 MED ORDER — PREDNISONE 5 MG PO TABS
5.0000 mg | ORAL_TABLET | Freq: Every day | ORAL | Status: DC
  Administered 2017-07-01 – 2017-07-07 (×7): 5 mg via ORAL
  Filled 2017-06-30 (×7): qty 1

## 2017-06-30 MED ORDER — POLYETHYLENE GLYCOL 3350 17 G PO PACK: 17.0000 g | PACK | Freq: Two times a day (BID) | ORAL | Status: DC | PRN

## 2017-06-30 MED ORDER — MUSCLE RUB 10-15 % EX CREA: 1.0000 "application " | TOPICAL_CREAM | Freq: Two times a day (BID) | CUTANEOUS | Status: DC | PRN

## 2017-06-30 MED ORDER — FERROUS SULFATE 325 (65 FE) MG PO TABS
325.0000 mg | ORAL_TABLET | Freq: Every day | ORAL | Status: DC
  Administered 2017-07-01 – 2017-07-07 (×7): 325 mg via ORAL
  Filled 2017-06-30 (×7): qty 1

## 2017-06-30 MED ORDER — FINASTERIDE 5 MG PO TABS
5.0000 mg | ORAL_TABLET | Freq: Every day | ORAL | Status: DC
Start: 2017-07-01 — End: 2017-07-07
  Administered 2017-07-01 – 2017-07-07 (×7): 5 mg via ORAL
  Filled 2017-06-30 (×7): qty 1

## 2017-06-30 MED ORDER — ALBUTEROL SULFATE (2.5 MG/3ML) 0.083% IN NEBU: 2.5000 mg | INHALATION_SOLUTION | Freq: Four times a day (QID) | RESPIRATORY_TRACT | Status: DC | PRN

## 2017-06-30 MED ORDER — VANCOMYCIN HCL 10 G IV SOLR
1500.0000 mg | Freq: Once | INTRAVENOUS | Status: AC
  Administered 2017-06-30: 1500 mg via INTRAVENOUS
  Filled 2017-06-30: qty 1500

## 2017-06-30 MED ORDER — VANCOMYCIN HCL 10 G IV SOLR
1250.0000 mg | INTRAVENOUS | Status: DC
  Administered 2017-07-02: 1250 mg via INTRAVENOUS
  Filled 2017-06-30: qty 1250

## 2017-06-30 MED ORDER — ENOXAPARIN SODIUM 40 MG/0.4ML ~~LOC~~ SOLN
40.0000 mg | SUBCUTANEOUS | Status: DC
  Administered 2017-07-01 – 2017-07-07 (×7): 40 mg via SUBCUTANEOUS
  Filled 2017-06-30 (×7): qty 0.4

## 2017-06-30 MED ORDER — LEVOTHYROXINE SODIUM 125 MCG PO TABS
125.0000 ug | ORAL_TABLET | Freq: Every day | ORAL | Status: DC
  Administered 2017-07-01 – 2017-07-07 (×7): 125 ug via ORAL
  Filled 2017-06-30 (×8): qty 1

## 2017-06-30 MED ORDER — INSULIN ASPART 100 UNIT/ML ~~LOC~~ SOLN
0.0000 [IU] | Freq: Three times a day (TID) | SUBCUTANEOUS | Status: DC
  Administered 2017-07-01: 3 [IU] via SUBCUTANEOUS
  Administered 2017-07-02 – 2017-07-03 (×2): 8 [IU] via SUBCUTANEOUS
  Administered 2017-07-03 – 2017-07-05 (×4): 5 [IU] via SUBCUTANEOUS
  Administered 2017-07-05: 3 [IU] via SUBCUTANEOUS
  Administered 2017-07-06: 8 [IU] via SUBCUTANEOUS
  Administered 2017-07-06: 3 [IU] via SUBCUTANEOUS
  Administered 2017-07-07: 8 [IU] via SUBCUTANEOUS
  Administered 2017-07-07: 2 [IU] via SUBCUTANEOUS

## 2017-06-30 MED ORDER — SODIUM CHLORIDE 0.9 % IV BOLUS (SEPSIS)
250.0000 mL | Freq: Once | INTRAVENOUS | Status: AC
  Administered 2017-06-30: 250 mL via INTRAVENOUS

## 2017-06-30 MED ORDER — POTASSIUM CHLORIDE CRYS ER 20 MEQ PO TBCR: 20.0000 meq | EXTENDED_RELEASE_TABLET | Freq: Every day | ORAL | Status: DC

## 2017-06-30 MED ORDER — SODIUM CHLORIDE 0.9 % IV SOLN
INTRAVENOUS | Status: DC
  Administered 2017-06-30: 75 mL/h via INTRAVENOUS

## 2017-06-30 MED ORDER — TRAZODONE HCL 50 MG PO TABS
50.0000 mg | ORAL_TABLET | Freq: Every day | ORAL | Status: DC
  Administered 2017-07-01 – 2017-07-06 (×7): 50 mg via ORAL
  Filled 2017-06-30 (×7): qty 1

## 2017-06-30 MED ORDER — IOPAMIDOL (ISOVUE-300) INJECTION 61%
INTRAVENOUS | Status: AC
  Administered 2017-06-30: 60 mL via INTRAVENOUS
  Filled 2017-06-30: qty 75

## 2017-06-30 MED ORDER — DEXTROSE 5 % IV SOLN
2.0000 g | INTRAVENOUS | Status: AC
  Administered 2017-06-30: 2 g via INTRAVENOUS
  Filled 2017-06-30: qty 2

## 2017-06-30 MED ORDER — ACETAMINOPHEN 325 MG PO TABS: 650.0000 mg | ORAL_TABLET | Freq: Four times a day (QID) | ORAL | Status: DC | PRN

## 2017-06-30 NOTE — Progress Notes (Signed)
A consult was received from an ED physician for vancomycin per pharmacy dosing.  The patient's profile has been reviewed for ht/wt/allergies/indication/available labs.   A one time order has been placed for vanc 1500 mg .  Further antibiotics/pharmacy consults should be ordered by admitting physician if indicated.                       Thank you,Mirian Casco Mamie Nick. Yahayra Geis, Pharm.D. [phone] 06/30/2017 8:08 PM

## 2017-06-30 NOTE — ED Notes (Signed)
Pt states he does not feel SOB.

## 2017-06-30 NOTE — ED Notes (Signed)
Pt has been provided with diet ginger ale at this time.

## 2017-06-30 NOTE — Progress Notes (Signed)
Pharmacy Antibiotic Note  Carlos Morton is a 83 y.o. male admitted on 06/30/2017 with pneumonia.  Pharmacy has been consulted for cefepime and vancomycin dosing.  Plan: Cefepime 2 Gm x1 then 1 Gm IV q12h Vancomycin 1500 mg x1 then 1250 mg IV q48h for est AUC=493 Goal AUC=400-500 F/u scr/cultures/levels  Height: 5\' 8"  (172.7 cm) Weight: 146 lb (66.2 kg) IBW/kg (Calculated) : 68.4  Temp (24hrs), Avg:97.9 F (36.6 C), Min:97.4 F (36.3 C), Max:98.6 F (37 C)  Recent Labs  Lab 06/24/17 0413 06/25/17 0435 06/30/17 1631 06/30/17 2022 06/30/17 2249  WBC 14.7* 11.6* 13.8*  --   --   CREATININE 1.33*  --  1.31*  --   --   LATICACIDVEN  --   --   --  3.47* 0.81    Estimated Creatinine Clearance: 29.5 mL/min (A) (by C-G formula based on SCr of 1.31 mg/dL (H)).    Allergies  Allergen Reactions  . Codeine Other (See Comments)    unknown  . Demerol [Meperidine] Other (See Comments)     Unknown per pt  mar  . Diltiazem Other (See Comments)     Unknown per pt  mar  . Penicillins Other (See Comments)     Unknown per pt  mar  . Sulfa Drugs Cross Reactors Hives    [facility]    Antimicrobials this admission: 12/26 cefepime >>  12/26 vancomycin >>   Dose adjustments this admission:   Microbiology results:  BCx:   UCx:    Sputum:    MRSA PCR:   Thank you for allowing pharmacy to be a part of this patient's care.  Lorenza EvangelistGreen, Merrissa Giacobbe R 06/30/2017 11:14 PM

## 2017-06-30 NOTE — ED Notes (Signed)
Writer notified EPD of abnormal I stat lactic result

## 2017-06-30 NOTE — ED Triage Notes (Signed)
[facility] Patient has Pnu and recurrent UTI patient finished antibiotics 5 days ago and patient isn't getting better. Patient has no complaints at this time daughter wished he get rechecked. Patient has been coughing up brown mucus. 92%on RA. No fever and no pain.

## 2017-06-30 NOTE — H&P (Signed)
History and Physical    Carlos LevyClayton Morton ZOX:[id number] DOB: 10/24/1916 DOA: 06/30/2017  PCP: Karle PlumberArvind, Moogali M, MD  Patient coming from: [facility]  I have personally briefly reviewed patient[facility]  Chief Complaint: Recurrent PNA, UTI  HPI: Carlos LevyClayton Krus is a 83 y.o. male with medical history significant of A.Fib, rate controlled not on anticoagulation, DM2, COPD, CHF.  Patient was just admitted to our service last week for HCAP, discharged on 12/21.  Finished PO ABx today but stopped improving.  Brought back to ED for persistent cough, wet breath sounds.  No fever nor pain.   ED Course: BNP 1000, WBC 13k up from 11k at discharge, CT scan most c/w B PNA, they see cardiomegaly but are not calling acute pulm edema.  UA is suggestive of UTI with moderate leuks and too many to count WBC.  Patient put back on cefepime and vanc that he was on during hospital stay.   Review of Systems: As per HPI otherwise 10 point review of systems negative.   Past Medical History:  Diagnosis Date  . Atrial fibrillation (HCC)   . CHF (congestive heart failure) (HCC)   . Diabetes mellitus   . Emphysema   . High cholesterol   . Hypertension   . Thyroid disease     Past Surgical History:  Procedure Laterality Date  . APPENDECTOMY    . CAROTID STENT     on blood thinner for stent  . REPLACEMENT TOTAL KNEE     left knee     reports that he has quit smoking. His smoking use included cigarettes. He smoked 3.00 packs per day. he has never used smokeless tobacco. He reports that he does not drink alcohol or use drugs.  Allergies  Allergen Reactions  . Codeine Other (See Comments)    unknown  . Demerol [Meperidine] Other (See Comments)     Unknown per pt  mar  . Diltiazem Other (See Comments)     Unknown per pt  mar  . Penicillins Other (See Comments)     Unknown per pt  mar  . Sulfa Drugs Cross Reactors Hives    [facility]    History reviewed.  No pertinent family history.   Prior to Admission medications   Medication Sig Start Date End Date Taking? Authorizing Provider  allopurinol (ZYLOPRIM) 100 MG tablet Take 200 mg by mouth daily.   Yes [provider]  aspirin EC 81 MG tablet Take 81 mg by mouth daily.   Yes [provider]  celecoxib (CELEBREX) 100 MG capsule Take 100 mg by mouth 2 (two) times daily.   Yes [provider]  ferrous sulfate 325 (65 FE) MG EC tablet Take 325 mg by mouth daily with breakfast.   Yes [provider]  finasteride (PROSCAR) 5 MG tablet Take 1 tablet (5 mg total) by mouth daily. 01/05/14  Yes Jeralyn BennettZamora, Ezequiel, MD  furosemide (LASIX) 20 MG tablet Take 1 tablet (20 mg total) by mouth 2 (two) times daily. 01/05/14  Yes Jeralyn BennettZamora, Ezequiel, MD  gabapentin (NEURONTIN) 100 MG capsule Take 100 mg by mouth at bedtime.   Yes [provider]  glipiZIDE (GLUCOTROL XL) 10 MG 24 hr tablet Take 10 mg by mouth daily with breakfast.   Yes [provider]  levothyroxine (SYNTHROID, LEVOTHROID) 125 MCG tablet Take 125 mcg by mouth daily before breakfast.   Yes [provider]  metFORMIN (GLUCOPHAGE) 1000 MG tablet Take  1,000 mg by mouth 2 (two) times daily with a meal.   Yes [provider]  potassium chloride SA (K-DUR,KLOR-CON) 20 MEQ tablet Take 20 mEq by mouth daily.   Yes [provider]  predniSONE (DELTASONE) 5 MG tablet Take 5 mg by mouth daily with breakfast.   Yes [provider]  senna (SENOKOT) 8.6 MG TABS tablet Take 1 tablet by mouth at bedtime.    Yes [provider]  traZODone (DESYREL) 50 MG tablet Take 50 mg by mouth at bedtime.   Yes [provider]  acetaminophen (TYLENOL) 325 MG tablet Take 2 tablets (650 mg total) by mouth every 6 (six) hours as needed for mild pain, moderate pain, fever or headache. Patient taking differently: Take 650 mg by mouth every 6 (six) hours as needed for moderate pain.   01/05/14   Jeralyn BennettZamora, Ezequiel, MD  albuterol (PROVENTIL) (2.5 MG/3ML) 0.083% nebulizer solution Take 2.5 mg by nebulization every 6 (six) hours as needed for wheezing or shortness of breath.    [provider]  Camphor (JOINTFLEX) 3.1 % CREA Apply 1 application topically 2 (two) times daily as needed (pain).    [provider]  Polyethyl Glycol-Propyl Glycol (SYSTANE OP) Apply 1-2 drops to eye daily as needed (dry eyes).    [provider]  polyethylene glycol (MIRALAX / GLYCOLAX) packet Take 17 g by mouth 2 (two) times daily as needed for mild constipation.    [provider]    Physical Exam: Vitals:   06/30/17 1553 06/30/17 1635 06/30/17 1936 06/30/17 2150  BP: 136/89 136/88 124/75 118/69  Pulse: 77 72 81 68  Resp: 18 15 20 19   Temp: 97.8 F (36.6 C) 97.6 F (36.4 C) (!) 97.4 F (36.3 C)   TempSrc: Oral Oral Oral   SpO2: 97% 99% 100% 98%  Weight:      Height:        Constitutional: NAD, calm, comfortable Eyes: PERRL, lids and conjunctivae normal ENMT: Mucous membranes are moist. Posterior pharynx clear of any exudate or lesions.Normal dentition.  Neck: normal, supple, no masses, no thyromegaly Respiratory: Wet breath sounds, wet sounding cough Cardiovascular: Regular rate and rhythm, no murmurs / rubs / gallops. No extremity edema. 2+ pedal pulses. No carotid bruits.  Abdomen: no tenderness, no masses palpated. No hepatosplenomegaly. Bowel sounds positive.  Musculoskeletal: no clubbing / cyanosis. No joint deformity upper and lower extremities. Good ROM, no contractures. Normal muscle tone.  Skin: no rashes, lesions, ulcers. No induration Neurologic: CN 2-12 grossly intact. Sensation intact, DTR normal. Strength 5/5 in all 4.  Psychiatric: Normal judgment and insight. Alert and oriented x 3. Normal mood.    Labs on Admission: I have personally reviewed following labs and imaging studies  CBC: Recent Labs  Lab 06/24/17 0413 06/25/17 0435  06/30/17 1631  WBC 14.7* 11.6* 13.8*  NEUTROABS  --   --  11.6*  HGB 8.2* 8.3* 9.4*  HCT 25.3* 25.6* 29.0*  MCV 92.3 92.1 92.4  PLT 175 166 252   Basic Metabolic Panel: Recent Labs  Lab 06/24/17 0413 06/30/17 1631  NA 138 136  K 3.6 4.8  CL 108 101  CO2 26 27  GLUCOSE 90 179*  BUN 28* 24*  CREATININE 1.33* 1.31*  CALCIUM 8.7* 9.0   GFR: Estimated Creatinine Clearance: 29.5 mL/min (A) (by C-G formula based on SCr of 1.31 mg/dL (H)). Liver Function Tests: No results for input(s): AST, ALT, ALKPHOS, BILITOT, PROT, ALBUMIN in the last 168 hours.  No results for input(s): LIPASE, AMYLASE in the last 168 hours. No results for input(s): AMMONIA in the last 168 hours. Coagulation Profile: No results for input(s): INR, PROTIME in the last 168 hours. Cardiac Enzymes: No results for input(s): CKTOTAL, CKMB, CKMBINDEX, TROPONINI in the last 168 hours. BNP (last 3 results) No results for input(s): PROBNP in the last 8760 hours. HbA1C: No results for input(s): HGBA1C in the last 72 hours. CBG: No results for input(s): GLUCAP in the last 168 hours. Lipid Profile: No results for input(s): CHOL, HDL, LDLCALC, TRIG, CHOLHDL, LDLDIRECT in the last 72 hours. Thyroid Function Tests: No results for input(s): TSH, T4TOTAL, FREET4, T3FREE, THYROIDAB in the last 72 hours. Anemia Panel: No results for input(s): VITAMINB12, FOLATE, FERRITIN, TIBC, IRON, RETICCTPCT in the last 72 hours. Urine analysis:    Component Value Date/Time   COLORURINE YELLOW 06/30/2017 2009   APPEARANCEUR HAZY (A) 06/30/2017 2009   LABSPEC 1.013 06/30/2017 2009   PHURINE 5.0 06/30/2017 2009   GLUCOSEU NEGATIVE 06/30/2017 2009   HGBUR SMALL (A) 06/30/2017 2009   BILIRUBINUR NEGATIVE 06/30/2017 2009   KETONESUR NEGATIVE 06/30/2017 2009   PROTEINUR NEGATIVE 06/30/2017 2009   UROBILINOGEN 1.0 12/29/2013 0201   NITRITE NEGATIVE 06/30/2017 2009   LEUKOCYTESUR MODERATE (A) 06/30/2017 2009    Radiological Exams on  Admission: Dg Chest 2 View  Result Date: 06/30/2017 CLINICAL DATA:  Productive cough. Finish treatment for pneumonia without relief. EXAM: CHEST  2 VIEW COMPARISON:  06/21/2017 FINDINGS: Enlarged cardiac silhouette. Calcific atherosclerotic disease and tortuosity of the aorta. There is persistent patchy airspace consolidation versus pulmonary nodules in bilateral lower lobes. Relative hypoinflation of the lungs. No evidence of pneumothorax or large pleural effusion. Osseus structures are stable. IMPRESSION: Persistent patchy airspace consolidation versus pulmonary nodules in bilateral lower lobes. Given the persistence of this abnormality malignancy cannot be excluded. Electronically Signed   By: Ted Mcalpine M.D.   On: 06/30/2017 17:11   Ct Chest W Contrast  Result Date: 06/30/2017 CLINICAL DATA:  Pneumonia, recurrent urinary tract infection, productive cough, hypoxia, fever other EXAM: CT CHEST WITH CONTRAST TECHNIQUE: Multidetector CT imaging of the chest was performed during intravenous contrast administration. CONTRAST:  60mL ISOVUE-300 IOPAMIDOL (ISOVUE-300) INJECTION 61% COMPARISON:  06/30/2017 chest x-ray FINDINGS: Cardiovascular: Atherosclerosis of the thoracic aorta. Two vessel arch anatomy appearing patent and tortuous. No significant aneurysm or dissection. Visualized central pulmonary arteries appear patent. Cardiomegaly noted. Native coronary atherosclerosis present. Negative for pericardial effusion. Mediastinum/Nodes: Small prominent scattered mediastinal and hilar lymph nodes, suspect reactive. Trachea and central airways appear patent. No hiatal hernia. Lungs/Pleura: Background emphysema pattern most pronounced in the upper lobes. Mild associated peripheral interstitial fibrotic pattern in the upper lobes. Dependent bilateral lower lobe as well as right upper lobe and left upper lobe patchy and nodular airspace process compatible with bilateral pneumonia. Pneumonia is most  consolidated in the right lower lobe. No associated interstitial edema. No pneumothorax. Trace left pleural effusion medially. Upper Abdomen: Abdominal atherosclerosis noted. Cholelithiasis present. Suspect left renal cysts. No acute upper abdominal process. Musculoskeletal: Degenerative changes noted spine and associated scoliosis. No chest wall soft tissue abnormality or asymmetry. Degenerative changes of the shoulders. Increased thoracic kyphosis noted on the lateral view. No acute compression fracture. Sternum intact. IMPRESSION: Patchy nodular bilateral airspace process / consolidation compatible with bilateral pneumonia. Difficult to exclude component of aspiration. Underlying chronic emphysema and mild interstitial fibrosis peripherally. Trace right pleural effusion medially Cardiomegaly without pericardial effusion or associated CHF coronary atherosclerosis Aortic Atherosclerosis (ICD10-I70.0)  and Emphysema (ICD10-J43.9). Electronically Signed   By: Judie Petit.  Shick M.D.   On: 06/30/2017 19:17    EKG: Independently reviewed.  Assessment/Plan Principal Problem:   HCAP (healthcare-associated pneumonia) Active Problems:   DM (diabetes mellitus), type 2 (HCC)   Atrial fibrillation with controlled ventricular response (HCC)   Acute lower UTI   Chronic systolic CHF (congestive heart failure) (HCC)   COPD (chronic obstructive pulmonary disease) (HCC)   CKD (chronic kidney disease) stage 3, GFR 30-59 ml/min (HCC)    1. HCAP - likely a "partially treated" pneumonia, failed outpatient treatment 1. PNA pathway 2. Resume rocephin / vanc, presumably organism is sensitive to one of these, as he certainly doesn't have the AKI, AMS, nor severe sepsis described at time of last admission. 3. Cultures pending 4. Lactate initially 3.47, but cleared after just 250cc NS and ABx 5. IVF: 250 cc bolus, will hold on further fluids for the moment and just hold lasix to "gently hydrate" the patient. 2. UTI - 1. ABx as  above 2. UCx pending 3. Chronic systolic CHF - 1. BNP of 1000 2. However not felt to be in exacerbation at the moment as this isnt what they are seeing on the CT scan 3. Will hold home lasix for now. 4. Observe for signs of overload 4. Renal insufficiency - I suspect that his creat of 1.3 today may be his baseline and he may have underlying CKD stage 3.  Also note h/o gout (on allopurinol). 1. Holding lasix 2. Intake and output 3. Repeat BMP in AM 4. Holding celebrex 5. Holding metformin 5. DM2 - 1. Hold PO hypoglycemics 2. Mod scale SSI AC 6. A.Fib - 1. Rate controlled, not on any rate ctrl meds 7. COPD - 1. Continue chronic O2 2. In setting of PNA, will leave on the prednisone 5 that he is currently getting at ALF 1. Looks like he was on this prior to last admit too 2. Im not clear if this is a chronic med or not, need to find out to decide wether to taper him off after discharge or not.  DVT prophylaxis: Lovenox Code Status: DNR Family Communication: Family at bedside Disposition Plan: ALF after admit Consults called: None Admission status: Admit to inpatient - failed outpatient treatment of CAP   Lyda Perone M. DO [facility] Pager [phone]  If 7AM-7PM, please contact day team taking care of patient [email or web address] Password [facility]  06/30/2017, 10:53 PM

## 2017-06-30 NOTE — ED Provider Notes (Signed)
[facility]-EMERGENCY DEPT Provider Note   CSN: [id number] Arrival date & time: 06/30/17  1533     History   Chief Complaint No chief complaint on file.   HPI Carlos Morton is a 83 y.o. male.  Patient was admitted December 17 - December 21 for pneumonia.  With a presentation of sepsis.  Patient just completed his course of oral antibiotics today.  But said increased shortness of breath and worse cough.  EMS brought him in from [facility].  Family states he is there at assisted living.  Patient has long-standing indwelling Foley catheter.  Patient is on 2 L of oxygen.  Room air sats are good on that.  Patient denies fever or pain.      Past Medical History:  Diagnosis Date  . Atrial fibrillation (HCC)   . CHF (congestive heart failure) (HCC)   . Diabetes mellitus   . Emphysema   . High cholesterol   . Hypertension   . Thyroid disease     Patient Active Problem List   Diagnosis Date Noted  . AKI (acute kidney injury) (HCC)   . PNA (pneumonia) 06/22/2017  . Acute lower UTI 06/22/2017  . Leukocytosis 06/22/2017  . Cough 06/22/2017  . Chronic systolic CHF (congestive heart failure) (HCC) 06/22/2017  . COPD (chronic obstructive pulmonary disease) (HCC) 06/22/2017  . On home oxygen therapy 06/22/2017  . DNR (do not resuscitate) 06/22/2017  . Palliative care encounter 01/03/2014  . SIRS (systemic inflammatory response syndrome) (HCC) 12/29/2013  . CHF (congestive heart failure) (HCC) 06/21/2013  . Bradycardia 10/27/2011  . DM (diabetes mellitus), type 2 (HCC) 10/22/2011  . CHF exacerbation (HCC) 10/22/2011  . CAD (coronary artery disease), native coronary artery 10/22/2011  . COPD bronchitis 10/22/2011  . Hypothyroidism (acquired) 10/22/2011  . Atrial fibrillation with controlled ventricular response (HCC) 10/22/2011    Past Surgical History:  Procedure Laterality Date  . APPENDECTOMY    . CAROTID STENT     on blood thinner for stent  .  REPLACEMENT TOTAL KNEE     left knee       Home Medications    Prior to Admission medications   Medication Sig Start Date End Date Taking? Authorizing Provider  allopurinol (ZYLOPRIM) 100 MG tablet Take 200 mg by mouth daily.   Yes [provider]  aspirin EC 81 MG tablet Take 81 mg by mouth daily.   Yes [provider]  celecoxib (CELEBREX) 100 MG capsule Take 100 mg by mouth 2 (two) times daily.   Yes [provider]  ferrous sulfate 325 (65 FE) MG EC tablet Take 325 mg by mouth daily with breakfast.   Yes [provider]  finasteride (PROSCAR) 5 MG tablet Take 1 tablet (5 mg total) by mouth daily. 01/05/14  Yes Jeralyn BennettZamora, Ezequiel, MD  furosemide (LASIX) 20 MG tablet Take 1 tablet (20 mg total) by mouth 2 (two) times daily. 01/05/14  Yes Jeralyn BennettZamora, Ezequiel, MD  gabapentin (NEURONTIN) 100 MG capsule Take 100 mg by mouth at bedtime.   Yes [provider]  glipiZIDE (GLUCOTROL XL) 10 MG 24 hr tablet Take 10 mg by mouth daily with breakfast.   Yes [provider]  levothyroxine (SYNTHROID, LEVOTHROID) 125 MCG tablet Take 125 mcg by mouth daily before breakfast.   Yes [provider]  metFORMIN (GLUCOPHAGE) 1000 MG tablet Take 1,000 mg by mouth 2 (two) times daily with a meal.   Yes [provider]  potassium chloride SA (K-DUR,KLOR-CON)  20 MEQ tablet Take 20 mEq by mouth daily.   Yes [provider]  predniSONE (DELTASONE) 5 MG tablet Take 5 mg by mouth daily with breakfast.   Yes [provider]  senna (SENOKOT) 8.6 MG TABS tablet Take 1 tablet by mouth at bedtime.    Yes [provider]  traZODone (DESYREL) 50 MG tablet Take 50 mg by mouth at bedtime.   Yes [provider]  acetaminophen (TYLENOL) 325 MG tablet Take 2 tablets (650 mg total) by mouth every 6 (six) hours as needed for mild pain, moderate pain, fever or headache. Patient taking differently: Take 650 mg by mouth every 6 (six)  hours as needed for moderate pain.  01/05/14   Jeralyn Bennett, MD  albuterol (PROVENTIL) (2.5 MG/3ML) 0.083% nebulizer solution Take 2.5 mg by nebulization every 6 (six) hours as needed for wheezing or shortness of breath.    [provider]  Camphor (JOINTFLEX) 3.1 % CREA Apply 1 application topically 2 (two) times daily as needed (pain).    [provider]  cefpodoxime (VANTIN) 200 MG tablet Take 1 tablet (200 mg total) by mouth every 12 (twelve) hours. For 3days Patient not taking: Reported on 06/30/2017 06/25/17   Zannie Cove, MD  lisinopril (PRINIVIL,ZESTRIL) 2.5 MG tablet  06/17/17   [provider]  Polyethyl Glycol-Propyl Glycol (SYSTANE OP) Apply 1-2 drops to eye daily as needed (dry eyes).    [provider]  polyethylene glycol (MIRALAX / GLYCOLAX) packet Take 17 g by mouth 2 (two) times daily as needed for mild constipation.    [provider]    Family History History reviewed. No pertinent family history.  Social History Social History   Tobacco Use  . Smoking status: Former Smoker    Packs/day: 3.00    Types: Cigarettes  . Smokeless tobacco: Never Used  Substance Use Topics  . Alcohol use: No  . Drug use: No     Allergies   Codeine; Demerol [meperidine]; Diltiazem; Penicillins; and Sulfa drugs cross reactors   Review of Systems Review of Systems  Constitutional: Negative for fever.  HENT: Positive for congestion.   Eyes: Negative for redness.  Respiratory: Positive for cough and shortness of breath.   Cardiovascular: Negative for chest pain.  Gastrointestinal: Negative for abdominal pain, nausea and vomiting.  Genitourinary: Negative for dysuria.  Musculoskeletal: Negative for myalgias.  Skin: Negative for rash.  Neurological: Negative for headaches.  Hematological: Does not bruise/bleed easily.  Psychiatric/Behavioral: Negative for confusion.     Physical Exam Updated Vital Signs BP 124/75 (BP Location:  Right Arm)   Pulse 81   Temp (!) 97.4 F (36.3 C) (Oral)   Resp 20   Ht 1.727 m (5\' 8" )   Wt 66.2 kg (146 lb)   SpO2 100%   BMI 22.20 kg/m   Physical Exam  Constitutional: He appears well-developed and well-nourished. No distress.  HENT:  Head: Normocephalic and atraumatic.  Mouth/Throat: Oropharynx is clear and moist.  Eyes: Conjunctivae and EOM are normal. Pupils are equal, round, and reactive to light.  Neck: Neck supple.  Cardiovascular: Normal rate.  Pulmonary/Chest: Effort normal and breath sounds normal. No respiratory distress. He has no wheezes.  Abdominal: Soft. Bowel sounds are normal. There is no tenderness.  Genitourinary:  Genitourinary Comments: Foley catheter in place.  Neurological: He is alert. No cranial nerve deficit or sensory deficit. He exhibits normal muscle tone. Coordination normal.  Skin: Skin is warm.  Nursing note and vitals reviewed.  ED Treatments / Results  Labs (all labs ordered are listed, but only abnormal results are displayed) Labs Reviewed  CBC WITH DIFFERENTIAL/PLATELET - Abnormal; Notable for the following components:      Result Value   WBC 13.8 (*)    RBC 3.14 (*)    Hemoglobin 9.4 (*)    HCT 29.0 (*)    Neutro Abs 11.6 (*)    All other components within normal limits  BASIC METABOLIC PANEL - Abnormal; Notable for the following components:   Glucose, Bld 179 (*)    BUN 24 (*)    Creatinine, Ser 1.31 (*)    GFR calc non Af Amer 44 (*)    GFR calc Af Amer 50 (*)    All other components within normal limits  BRAIN NATRIURETIC PEPTIDE - Abnormal; Notable for the following components:   B Natriuretic Peptide 1,088.6 (*)    All other components within normal limits  I-STAT CG4 LACTIC ACID, ED - Abnormal; Notable for the following components:   Lactic Acid, Venous 3.47 (*)    All other components within normal limits  CULTURE, BLOOD (ROUTINE X 2)  CULTURE, BLOOD (ROUTINE X 2)  URINE CULTURE  URINALYSIS, ROUTINE W REFLEX  MICROSCOPIC    EKG  EKG Interpretation None      ED ECG REPORT   Date: 06/30/2017  Rate: 74  Rhythm: normal sinus rhythm  QRS Axis: left  Intervals: normal  ST/T Wave abnormalities: nonspecific ST/T changes  Conduction Disutrbances:none  Narrative Interpretation:   Old EKG Reviewed: none available  I have personally reviewed the EKG tracing and agree with the computerized printout as noted.  Some artifact.  Limiting interpretation.   Radiology Dg Chest 2 View  Result Date: 06/30/2017 CLINICAL DATA:  Productive cough. Finish treatment for pneumonia without relief. EXAM: CHEST  2 VIEW COMPARISON:  06/21/2017 FINDINGS: Enlarged cardiac silhouette. Calcific atherosclerotic disease and tortuosity of the aorta. There is persistent patchy airspace consolidation versus pulmonary nodules in bilateral lower lobes. Relative hypoinflation of the lungs. No evidence of pneumothorax or large pleural effusion. Osseus structures are stable. IMPRESSION: Persistent patchy airspace consolidation versus pulmonary nodules in bilateral lower lobes. Given the persistence of this abnormality malignancy cannot be excluded. Electronically Signed   By: Ted Mcalpineobrinka  Dimitrova M.D.   On: 06/30/2017 17:11   Ct Chest W Contrast  Result Date: 06/30/2017 CLINICAL DATA:  Pneumonia, recurrent urinary tract infection, productive cough, hypoxia, fever other EXAM: CT CHEST WITH CONTRAST TECHNIQUE: Multidetector CT imaging of the chest was performed during intravenous contrast administration. CONTRAST:  60mL ISOVUE-300 IOPAMIDOL (ISOVUE-300) INJECTION 61% COMPARISON:  06/30/2017 chest x-ray FINDINGS: Cardiovascular: Atherosclerosis of the thoracic aorta. Two vessel arch anatomy appearing patent and tortuous. No significant aneurysm or dissection. Visualized central pulmonary arteries appear patent. Cardiomegaly noted. Native coronary atherosclerosis present. Negative for pericardial effusion. Mediastinum/Nodes: Small  prominent scattered mediastinal and hilar lymph nodes, suspect reactive. Trachea and central airways appear patent. No hiatal hernia. Lungs/Pleura: Background emphysema pattern most pronounced in the upper lobes. Mild associated peripheral interstitial fibrotic pattern in the upper lobes. Dependent bilateral lower lobe as well as right upper lobe and left upper lobe patchy and nodular airspace process compatible with bilateral pneumonia. Pneumonia is most consolidated in the right lower lobe. No associated interstitial edema. No pneumothorax. Trace left pleural effusion medially. Upper Abdomen: Abdominal atherosclerosis noted. Cholelithiasis present. Suspect left renal cysts. No acute upper abdominal process. Musculoskeletal: Degenerative changes noted spine and associated scoliosis. No chest wall soft tissue  abnormality or asymmetry. Degenerative changes of the shoulders. Increased thoracic kyphosis noted on the lateral view. No acute compression fracture. Sternum intact. IMPRESSION: Patchy nodular bilateral airspace process / consolidation compatible with bilateral pneumonia. Difficult to exclude component of aspiration. Underlying chronic emphysema and mild interstitial fibrosis peripherally. Trace right pleural effusion medially Cardiomegaly without pericardial effusion or associated CHF coronary atherosclerosis Aortic Atherosclerosis (ICD10-I70.0) and Emphysema (ICD10-J43.9). Electronically Signed   By: Judie Petit.  Shick M.D.   On: 06/30/2017 19:17    Procedures Procedures (including critical care time)  Medications Ordered in ED Medications  0.9 %  sodium chloride infusion (75 mL/hr Intravenous New Bag/Given 06/30/17 1721)  aztreonam (AZACTAM) 2 g in dextrose 5 % 50 mL IVPB (not administered)  vancomycin (VANCOCIN) 1,500 mg in sodium chloride 0.9 % 500 mL IVPB (not administered)  sodium chloride 0.9 % bolus 250 mL (0 mLs Intravenous Stopped 06/30/17 1817)  iopamidol (ISOVUE-300) 61 % injection (60 mLs  Intravenous Contrast Given 06/30/17 1848)     Initial Impression / Assessment and Plan / ED Course  I have reviewed the triage vital signs and the nursing notes.  Pertinent labs & imaging results that were available during my care of the patient were reviewed by me and considered in my medical decision making (see chart for details).    Patient clinically with worsening shortness of breath increased congestion and cough.  Cough sounds very wet in nature.  CT chest was done to rule out pneumonia versus CHF.  Seems to be more consistent with a worsening bilateral pneumonia.  Patient restarted on H Antibiotics.  Lactic acid elevated but was elevated when he was discharged.  Not hypotensive.  Patient alert and responsive overall pretty stable.  Patient is a DNR.  Pre-existing.  Patient's white blood cell count is little higher than it was when he was discharged.  Overall this appears to be a worsening healthcare acquired pneumonia.  Discussed with hospitalist they will readmit.  Urine was sent urine culture sent blood culture sent.   Final Clinical Impressions(s) / ED Diagnoses   Final diagnoses:  HCAP (healthcare-associated pneumonia)    ED Discharge Orders    None       Vanetta Mulders, MD 06/30/17 2300

## 2017-06-30 NOTE — ED Notes (Signed)
Bed: [facility] Expected date:  Expected time:  Means of arrival:  Comments: 

## 2017-07-01 ENCOUNTER — Other Ambulatory Visit: Payer: Self-pay

## 2017-07-01 DIAGNOSIS — L8990 Pressure ulcer of unspecified site, unspecified stage: Secondary | ICD-10-CM

## 2017-07-01 LAB — BASIC METABOLIC PANEL
ANION GAP: 7 (ref 5–15)
BUN: 22 mg/dL — AB (ref 6–20)
CO2: 26 mmol/L (ref 22–32)
Calcium: 8.1 mg/dL — ABNORMAL LOW (ref 8.9–10.3)
Chloride: 103 mmol/L (ref 101–111)
Creatinine, Ser: 1.22 mg/dL (ref 0.61–1.24)
GFR calc Af Amer: 55 mL/min — ABNORMAL LOW (ref >60)
GFR, EST NON AFRICAN AMERICAN: 47 mL/min — AB (ref >60)
Glucose, Bld: 93 mg/dL (ref 65–99)
POTASSIUM: 3.5 mmol/L (ref 3.5–5.1)
SODIUM: 136 mmol/L (ref 135–145)

## 2017-07-01 LAB — GLUCOSE, CAPILLARY
GLUCOSE-CAPILLARY: 195 mg/dL — AB (ref 65–99)
GLUCOSE-CAPILLARY: 117 mg/dL — AB (ref 65–99)
GLUCOSE-CAPILLARY: 102 mg/dL — AB (ref 65–99)
Glucose-Capillary: 64 mg/dL — ABNORMAL LOW (ref 65–99)

## 2017-07-01 LAB — HIV ANTIBODY (ROUTINE TESTING W REFLEX): HIV SCREEN 4TH GENERATION: NONREACTIVE

## 2017-07-01 LAB — STREP PNEUMONIAE URINARY ANTIGEN: STREP PNEUMO URINARY ANTIGEN: NEGATIVE

## 2017-07-01 LAB — EXPECTORATED SPUTUM ASSESSMENT W REFEX TO RESP CULTURE

## 2017-07-01 LAB — EXPECTORATED SPUTUM ASSESSMENT W GRAM STAIN, RFLX TO RESP C

## 2017-07-01 NOTE — Progress Notes (Signed)
PROGRESS NOTE  Carlos Morton KGM:[id number] DOB: 06/26/1917 DOA: 06/30/2017 PCP: Karle Plumber, MD  HPI/Recap of past 24 hours: Carlos Morton is a 83 y.o. male with medical history significant of A.Fib, rate controlled not on anticoagulation, DM2, COPD, CHF.  Patient was just admitted to our service last week for HCAP, discharged on 12/21.  Finished PO ABx today but stopped improving.  Brought back to ED for persistent cough, wet breath sounds.  No fever nor pain.    Assessment/Plan: Principal Problem:   HCAP (healthcare-associated pneumonia) Active Problems:   DM (diabetes mellitus), type 2 (HCC)   Atrial fibrillation with controlled ventricular response (HCC)   Acute lower UTI   Chronic systolic CHF (congestive heart failure) (HCC)   COPD (chronic obstructive pulmonary disease) (HCC)   CKD (chronic kidney disease) stage 3, GFR 30-59 ml/min (HCC)   Pressure injury of skin  1. HCAP - likely partially treated pneumonia, failed outpatient treatment 1. PNA pathway 2. rocephin / vanc 3. Cultures in process 4. Lactate initially 3.47, but cleared after just 250cc NS and ABx        VF: 250 cc bolus 2. UTI - 1. ABx as above 2. UCx pending 3. Chronic systolic CHF - 1. BNP of 1000 2. However not felt to be in exacerbation at the moment as this isnt what they are seeing on the CT scan 3. Will hold home lasix for now. 4. Observe for signs of overload 4. Renal insufficiency - I suspect that his creat of 1.3 today may be his baseline and he may have underlying CKD stage 3.  Also note h/o gout (on allopurinol). 1. Holding lasix 2. Intake and output 3. Repeat BMP in AM 4. Holding celebrex 5. Holding metformin 5. DM2 - 1. Hold PO hypoglycemics 2. Mod scale SSI AC 6. A.Fib - 1. Rate controlled, not on any rate ctrl meds 7. COPD - 1. Continue chronic O2 In setting of PNA, will leave on the prednisone    DVT prophylaxis: Lovenox Code Status: DNR Family Communication: Family at  bedside Disposition Plan: ALF after admit Consults called: None    Objective: Vitals:   06/30/17 1936 06/30/17 2150 07/01/17 0045 07/01/17 0441  BP: 124/75 118/69 132/74 (!) 102/57  Pulse: 81 68 69 61  Resp: 20 19 18 16   Temp: (!) 97.4 F (36.3 C)  97.9 F (36.6 C) 98.4 F (36.9 C)  TempSrc: Oral  Oral Oral  SpO2: 100% 98% 100% 99%  Weight:      Height:        Intake/Output Summary (Last 24 hours) at 07/01/2017 0750 Last data filed at 07/01/2017 0030 Gross per 24 hour  Intake 1040 ml  Output 400 ml  Net 640 ml   Filed Weights   06/30/17 1550  Weight: 66.2 kg (146 lb)    Exam:  Constitutional: NAD, calm, comfortable Eyes: PERRL, lids and conjunctivae normal ENMT: Mucous membranes are moist. Posterior pharynx clear of any exudate or lesions.Normal dentition.  Neck: normal, supple, no masses, no thyromegaly Respiratory: diffused crackles Cardiovascular: Regular rate and rhythm, no murmurs / rubs / gallops. No extremity edema. 2+ pedal pulses. No carotid bruits.  Abdomen: no tenderness, no masses palpated. No hepatosplenomegaly. Bowel sounds positive.  Musculoskeletal: trace edema LE bilaterally Skin: no rashes, lesions, ulcers. No induration Neurologic: unable to assess does not follow commands Psychiatric: unable to assess due to AMS    Data Reviewed: CBC: Recent Labs  Lab 06/25/17 0435 06/30/17 1631  WBC  11.6* 13.8*  NEUTROABS  --  11.6*  HGB 8.3* 9.4*  HCT 25.6* 29.0*  MCV 92.1 92.4  PLT 166 252   Basic Metabolic Panel: Recent Labs  Lab 06/30/17 1631 07/01/17 0344  NA 136 136  K 4.8 3.5  CL 101 103  CO2 27 26  GLUCOSE 179* 93  BUN 24* 22*  CREATININE 1.31* 1.22  CALCIUM 9.0 8.1*   GFR: Estimated Creatinine Clearance: 31.7 mL/min (by C-G formula based on SCr of 1.22 mg/dL). Liver Function Tests: No results for input(s): AST, ALT, ALKPHOS, BILITOT, PROT, ALBUMIN in the last 168 hours. No results for input(s): LIPASE, AMYLASE in the last  168 hours. No results for input(s): AMMONIA in the last 168 hours. Coagulation Profile: No results for input(s): INR, PROTIME in the last 168 hours. Cardiac Enzymes: No results for input(s): CKTOTAL, CKMB, CKMBINDEX, TROPONINI in the last 168 hours. BNP (last 3 results) No results for input(s): PROBNP in the last 8760 hours. HbA1C: No results for input(s): HGBA1C in the last 72 hours. CBG: Recent Labs  Lab 06/30/17 2342  GLUCAP 82   Lipid Profile: No results for input(s): CHOL, HDL, LDLCALC, TRIG, CHOLHDL, LDLDIRECT in the last 72 hours. Thyroid Function Tests: No results for input(s): TSH, T4TOTAL, FREET4, T3FREE, THYROIDAB in the last 72 hours. Anemia Panel: No results for input(s): VITAMINB12, FOLATE, FERRITIN, TIBC, IRON, RETICCTPCT in the last 72 hours. Urine analysis:    Component Value Date/Time   COLORURINE YELLOW 06/30/2017 2009   APPEARANCEUR HAZY (A) 06/30/2017 2009   LABSPEC 1.013 06/30/2017 2009   PHURINE 5.0 06/30/2017 2009   GLUCOSEU NEGATIVE 06/30/2017 2009   HGBUR SMALL (A) 06/30/2017 2009   BILIRUBINUR NEGATIVE 06/30/2017 2009   KETONESUR NEGATIVE 06/30/2017 2009   PROTEINUR NEGATIVE 06/30/2017 2009   UROBILINOGEN 1.0 12/29/2013 0201   NITRITE NEGATIVE 06/30/2017 2009   LEUKOCYTESUR MODERATE (A) 06/30/2017 2009   Sepsis Labs: @LABRCNTIP (procalcitonin:4,lacticidven:4)  ) Recent Results (from the past 240 hour(s))  Blood culture (routine x 2)     Status: None   Collection Time: 06/21/17 10:19 PM  Result Value Ref Range Status   Specimen Description BLOOD RIGHT ANTECUBITAL  Final   Special Requests   Final    BOTTLES DRAWN AEROBIC AND ANAEROBIC Blood Culture adequate volume   Culture   Final    NO GROWTH 5 DAYS Performed at [facility], [address] [place], [place] [address]    Report Status 06/27/2017 FINAL  Final  Blood culture (routine x 2)     Status: None   Collection Time: 06/21/17 10:27 PM  Result Value Ref Range Status    Specimen Description BLOOD RIGHT FOREARM  Final   Special Requests   Final    BOTTLES DRAWN AEROBIC AND ANAEROBIC Blood Culture adequate volume   Culture   Final    NO GROWTH 5 DAYS Performed at [facility], [address] [place], [place] [address]    Report Status 06/27/2017 FINAL  Final  Culture, Urine     Status: Abnormal   Collection Time: 06/22/17 12:43 PM  Result Value Ref Range Status   Specimen Description URINE, RANDOM  Final   Special Requests Immunocompromised  Final   Culture MULTIPLE SPECIES PRESENT, SUGGEST RECOLLECTION (A)  Final   Report Status 06/23/2017 FINAL  Final  MRSA PCR Screening     Status: None   Collection Time: 06/22/17  4:19 PM  Result Value Ref Range Status   MRSA by  PCR NEGATIVE NEGATIVE Final    Comment:        The GeneXpert MRSA Assay (FDA approved for NASAL specimens only), is one component of a comprehensive MRSA colonization surveillance program. It is not intended to diagnose MRSA infection nor to guide or monitor treatment for MRSA infections.   Culture, sputum-assessment     Status: None   Collection Time: 06/24/17  4:43 PM  Result Value Ref Range Status   Specimen Description EXPECTORATED SPUTUM  Final   Special Requests NONE  Final   Sputum evaluation THIS SPECIMEN IS ACCEPTABLE FOR SPUTUM CULTURE  Final   Report Status 06/24/2017 FINAL  Final  Culture, respiratory (NON-Expectorated)     Status: None   Collection Time: 06/24/17  4:43 PM  Result Value Ref Range Status   Specimen Description EXPECTORATED SPUTUM  Final   Special Requests NONE Reflexed from [id number]  Final   Gram Stain   Final    MODERATE WBC PRESENT,BOTH PMN AND MONONUCLEAR FEW GRAM VARIABLE ROD RARE GRAM POSITIVE COCCI RARE YEAST    Culture   Final    Consistent with normal respiratory flora. Performed at [facility], [address], [place], [place] [address]    Report Status 06/27/2017 FINAL  Final      Studies: Dg Chest 2 View  Result  Date: 06/30/2017 CLINICAL DATA:  Productive cough. Finish treatment for pneumonia without relief. EXAM: CHEST  2 VIEW COMPARISON:  06/21/2017 FINDINGS: Enlarged cardiac silhouette. Calcific atherosclerotic disease and tortuosity of the aorta. There is persistent patchy airspace consolidation versus pulmonary nodules in bilateral lower lobes. Relative hypoinflation of the lungs. No evidence of pneumothorax or large pleural effusion. Osseus structures are stable. IMPRESSION: Persistent patchy airspace consolidation versus pulmonary nodules in bilateral lower lobes. Given the persistence of this abnormality malignancy cannot be excluded. Electronically Signed   By: Ted Mcalpine M.D.   On: 06/30/2017 17:11   Ct Chest W Contrast  Result Date: 06/30/2017 CLINICAL DATA:  Pneumonia, recurrent urinary tract infection, productive cough, hypoxia, fever other EXAM: CT CHEST WITH CONTRAST TECHNIQUE: Multidetector CT imaging of the chest was performed during intravenous contrast administration. CONTRAST:  60mL ISOVUE-300 IOPAMIDOL (ISOVUE-300) INJECTION 61% COMPARISON:  06/30/2017 chest x-ray FINDINGS: Cardiovascular: Atherosclerosis of the thoracic aorta. Two vessel arch anatomy appearing patent and tortuous. No significant aneurysm or dissection. Visualized central pulmonary arteries appear patent. Cardiomegaly noted. Native coronary atherosclerosis present. Negative for pericardial effusion. Mediastinum/Nodes: Small prominent scattered mediastinal and hilar lymph nodes, suspect reactive. Trachea and central airways appear patent. No hiatal hernia. Lungs/Pleura: Background emphysema pattern most pronounced in the upper lobes. Mild associated peripheral interstitial fibrotic pattern in the upper lobes. Dependent bilateral lower lobe as well as right upper lobe and left upper lobe patchy and nodular airspace process compatible with bilateral pneumonia. Pneumonia is most consolidated in the right lower lobe. No  associated interstitial edema. No pneumothorax. Trace left pleural effusion medially. Upper Abdomen: Abdominal atherosclerosis noted. Cholelithiasis present. Suspect left renal cysts. No acute upper abdominal process. Musculoskeletal: Degenerative changes noted spine and associated scoliosis. No chest wall soft tissue abnormality or asymmetry. Degenerative changes of the shoulders. Increased thoracic kyphosis noted on the lateral view. No acute compression fracture. Sternum intact. IMPRESSION: Patchy nodular bilateral airspace process / consolidation compatible with bilateral pneumonia. Difficult to exclude component of aspiration. Underlying chronic emphysema and mild interstitial fibrosis peripherally. Trace right pleural effusion medially Cardiomegaly without pericardial effusion or associated CHF coronary atherosclerosis Aortic Atherosclerosis (ICD10-I70.0) and Emphysema (ICD10-J43.9).  Electronically Signed   By: Judie PetitM.  Shick M.D.   On: 06/30/2017 19:17    Scheduled Meds: . allopurinol  200 mg Oral Daily  . aspirin EC  81 mg Oral Daily  . enoxaparin (LOVENOX) injection  40 mg Subcutaneous Q24H  . ferrous sulfate  325 mg Oral Q breakfast  . finasteride  5 mg Oral Daily  . gabapentin  100 mg Oral QHS  . insulin aspart  0-15 Units Subcutaneous TID WC  . levothyroxine  125 mcg Oral QAC breakfast  . predniSONE  5 mg Oral Q breakfast  . senna  1 tablet Oral QHS  . traZODone  50 mg Oral QHS    Continuous Infusions: . ceFEPime (MAXIPIME) IV    . [START ON 07/02/2017] vancomycin       LOS: 1 day     Darlin Droparole N Hall, MD [facility] Pager [phone]  If 7PM-7AM, please contact night-coverage [email or web address] Password [email or web address] 07/01/2017, 7:50 AM

## 2017-07-01 NOTE — Progress Notes (Signed)
Date: July 01, 2017 Rhonda Davis, BSN, RN3, CCM [phone] Chart and notes review for patient progress and needs. Will follow for case management and discharge needs. Next review date: [id number] 

## 2017-07-02 DIAGNOSIS — L8990 Pressure ulcer of unspecified site, unspecified stage: Secondary | ICD-10-CM

## 2017-07-02 LAB — GLUCOSE, CAPILLARY
GLUCOSE-CAPILLARY: 87 mg/dL (ref 65–99)
GLUCOSE-CAPILLARY: 277 mg/dL — AB (ref 65–99)
GLUCOSE-CAPILLARY: 187 mg/dL — AB (ref 65–99)
Glucose-Capillary: 51 mg/dL — ABNORMAL LOW (ref 65–99)
Glucose-Capillary: 92 mg/dL (ref 65–99)

## 2017-07-02 LAB — MRSA PCR SCREENING: MRSA by PCR: NEGATIVE

## 2017-07-02 LAB — URINE CULTURE

## 2017-07-02 NOTE — Progress Notes (Signed)
PROGRESS NOTE    Carlos LevyClayton Morton  ZOX:[id number] DOB: February 18, 1917 DOA: 06/30/2017 PCP: Karle PlumberArvind, Moogali M, MD      Brief Narrative:  83 yo M with Afib not on AC, DM, COPD, and CHF EF 35% and recent CAP who presents with persistent cough despite finishing course of Cefpodoxime.      Assessment & Plan:  Principal Problem:   HCAP (healthcare-associated pneumonia) Active Problems:   DM (diabetes mellitus), type 2 (HCC)   Atrial fibrillation with controlled ventricular response (HCC)   Acute lower UTI   Chronic systolic CHF (congestive heart failure) (HCC)   COPD (chronic obstructive pulmonary disease) (HCC)   CKD (chronic kidney disease) stage 3, GFR 30-59 ml/min (HCC)   Pressure injury of skin   Persistent pneumonia CT on admission showed bilateral lower, worst in RLL but also in bilateral upper lobes pneumonia.  SLP consult during last hospitalization suggested his swallow was well, didn't recommend MBS or diet modification, however, distribution (and nursing note of cough with swallowing) suggest aspiration contributing, seems very likely given age.   -Continue vanc and Cefepime -MRSA nasal swab again (negative earlier this month) and de-esc vancomycin if able -Chest PT ordered   Asymptomatic leukocyturia Unclear significance, covered by vanc/Cef  Chronic systolic CHF EF 35%.  Currently euvolemic. -Hold Lasix for 1 more day -Low threshold to restart tonight  COPD No wheezing. -Albuterol prn  Diabetes -Hold metformin -SSI   Chronic Atrial fibrillation CHADS2Vasc at least 5, not on AC.  Currently rate controlled off meds.  Hypothyroidism: -Continue levothyroxine  Other medications -Continue allopurinol, finasteride, gabapentin, trazodone -Continue prednisone        DVT prophylaxis: Lovenox Code Status: DO NOT RESUSCITATE Family Communication: Daughter at the bedside Disposition Plan: IV antibiotics, follow culture data.  Chest PT.  Likely will need 3-4  more days IV antibiotics, and chest PT.  After that, to SNF.  Overall prognosis guarded and this was discussed with daughter.   Consultants:   None  Procedures:   None  Antimicrobials:   Vancomycin  Cefepime    Subjective: Feels okay.  Breathing comfortable.  No chest pain, fever.  No confusion.  Very rattly wet cough.    Objective: Vitals:   07/01/17 1412 07/01/17 2131 07/02/17 0635 07/02/17 1453  BP: 120/75 122/67 (!) 104/50 113/71  Pulse: 74 85 69 68  Resp: 18 16 16 18   Temp: 97.9 F (36.6 C) 98.7 F (37.1 C) 98.2 F (36.8 C) 98.2 F (36.8 C)  TempSrc: Oral Oral Oral Oral  SpO2: 99% 100% 98% 100%  Weight:      Height:        Intake/Output Summary (Last 24 hours) at 07/02/2017 1459 Last data filed at 07/02/2017 1455 Gross per 24 hour  Intake 720 ml  Output 1100 ml  Net -380 ml   Filed Weights   06/30/17 1550  Weight: 66.2 kg (146 lb)    Examination: General appearance: Very elderly adult male, alert and in no acute distress on nasal cannula.   HEENT: Anicteric, conjunctiva pink, lids and lashes thin. No nasal deformity, discharge, epistaxis.  Lipsdry.   Skin: Warm and dry.  No jaundice.  No suspicious rashes or lesions. Cardiac: Irregular rhythm, regular rate, nl S1-S2, no murmurs appreciated.  Capillary refill is brisk.  JVP not elevated.  No LE edema.  Radial pulses 2+ and symmetric. Respiratory: Increased rate subtly.  Rales thorughout.  Diminished throughout.  No wheezes. Abdomen: Abdomen soft.  No TTP. No ascites, distension,  hepatosplenomegaly.   MSK: No deformities or effusions.  Diffuse loss of muscle mass and fat. Neuro: Awake and alert.  EOMI, moves all extremities. Speech fluent.    Psych: Sensorium intact and responding to questions, attention normal. Affect normal.  Judgment and insight appear normal.    Data Reviewed: I have personally reviewed following labs and imaging studies:  CBC: Recent Labs  Lab 06/30/17 1631  WBC 13.8*    NEUTROABS 11.6*  HGB 9.4*  HCT 29.0*  MCV 92.4  PLT 252   Basic Metabolic Panel: Recent Labs  Lab 06/30/17 1631 07/01/17 0344  NA 136 136  K 4.8 3.5  CL 101 103  CO2 27 26  GLUCOSE 179* 93  BUN 24* 22*  CREATININE 1.31* 1.22  CALCIUM 9.0 8.1*   GFR: Estimated Creatinine Clearance: 31.7 mL/min (by C-G formula based on SCr of 1.22 mg/dL). Liver Function Tests: No results for input(s): AST, ALT, ALKPHOS, BILITOT, PROT, ALBUMIN in the last 168 hours. No results for input(s): LIPASE, AMYLASE in the last 168 hours. No results for input(s): AMMONIA in the last 168 hours. Coagulation Profile: No results for input(s): INR, PROTIME in the last 168 hours. Cardiac Enzymes: No results for input(s): CKTOTAL, CKMB, CKMBINDEX, TROPONINI in the last 168 hours. BNP (last 3 results) No results for input(s): PROBNP in the last 8760 hours. HbA1C: No results for input(s): HGBA1C in the last 72 hours. CBG: Recent Labs  Lab 07/01/17 1223 07/01/17 1727 07/01/17 2129 07/02/17 0814 07/02/17 1148  GLUCAP 195* 117* 102* 87 277*   Lipid Profile: No results for input(s): CHOL, HDL, LDLCALC, TRIG, CHOLHDL, LDLDIRECT in the last 72 hours. Thyroid Function Tests: No results for input(s): TSH, T4TOTAL, FREET4, T3FREE, THYROIDAB in the last 72 hours. Anemia Panel: No results for input(s): VITAMINB12, FOLATE, FERRITIN, TIBC, IRON, RETICCTPCT in the last 72 hours. Urine analysis:    Component Value Date/Time   COLORURINE YELLOW 06/30/2017 2009   APPEARANCEUR HAZY (A) 06/30/2017 2009   LABSPEC 1.013 06/30/2017 2009   PHURINE 5.0 06/30/2017 2009   GLUCOSEU NEGATIVE 06/30/2017 2009   HGBUR SMALL (A) 06/30/2017 2009   BILIRUBINUR NEGATIVE 06/30/2017 2009   KETONESUR NEGATIVE 06/30/2017 2009   PROTEINUR NEGATIVE 06/30/2017 2009   UROBILINOGEN 1.0 12/29/2013 0201   NITRITE NEGATIVE 06/30/2017 2009   LEUKOCYTESUR MODERATE (A) 06/30/2017 2009   Sepsis  Labs: @LABRCNTIP (procalcitonin:4,lacticacidven:4)  ) Recent Results (from the past 240 hour(s))  MRSA PCR Screening     Status: None   Collection Time: 06/22/17  4:19 PM  Result Value Ref Range Status   MRSA by PCR NEGATIVE NEGATIVE Final    Comment:        The GeneXpert MRSA Assay (FDA approved for NASAL specimens only), is one component of a comprehensive MRSA colonization surveillance program. It is not intended to diagnose MRSA infection nor to guide or monitor treatment for MRSA infections.   Culture, sputum-assessment     Status: None   Collection Time: 06/24/17  4:43 PM  Result Value Ref Range Status   Specimen Description EXPECTORATED SPUTUM  Final   Special Requests NONE  Final   Sputum evaluation THIS SPECIMEN IS ACCEPTABLE FOR SPUTUM CULTURE  Final   Report Status 06/24/2017 FINAL  Final  Culture, respiratory (NON-Expectorated)     Status: None   Collection Time: 06/24/17  4:43 PM  Result Value Ref Range Status   Specimen Description EXPECTORATED SPUTUM  Final   Special Requests NONE Reflexed from [id number]  Final  Gram Stain   Final    MODERATE WBC PRESENT,BOTH PMN AND MONONUCLEAR FEW GRAM VARIABLE ROD RARE GRAM POSITIVE COCCI RARE YEAST    Culture   Final    Consistent with normal respiratory flora. Performed at [facility], [address] [place], [place] [address]    Report Status 06/27/2017 FINAL  Final  Culture, blood (Routine X 2) w Reflex to ID Panel     Status: None (Preliminary result)   Collection Time: 06/30/17  8:01 PM  Result Value Ref Range Status   Specimen Description BLOOD RIGHT ANTECUBITAL  Final   Special Requests   Final    BOTTLES DRAWN AEROBIC AND ANAEROBIC Blood Culture adequate volume   Culture   Final    NO GROWTH 2 DAYS Performed at [facility], [address] [place], [place] [address]    Report Status PENDING  Incomplete  Urine Culture     Status: Abnormal   Collection Time: 06/30/17  8:09 PM  Result Value  Ref Range Status   Specimen Description URINE, CLEAN CATCH  Final   Special Requests NONE  Final   Culture MULTIPLE SPECIES PRESENT, SUGGEST RECOLLECTION (A)  Final   Report Status 07/02/2017 FINAL  Final  Culture, blood (Routine X 2) w Reflex to ID Panel     Status: None (Preliminary result)   Collection Time: 06/30/17  8:13 PM  Result Value Ref Range Status   Specimen Description BLOOD LEFT ANTECUBITAL  Final   Special Requests   Final    BOTTLES DRAWN AEROBIC AND ANAEROBIC Blood Culture adequate volume   Culture   Final    NO GROWTH 2 DAYS Performed at [facility], [address], [place], [place] [address]    Report Status PENDING  Incomplete  Culture, sputum-assessment     Status: None   Collection Time: 07/01/17  3:00 PM  Result Value Ref Range Status   Specimen Description EXPECTORATED SPUTUM  Final   Special Requests NONE  Final   Sputum evaluation THIS SPECIMEN IS ACCEPTABLE FOR SPUTUM CULTURE  Final   Report Status 07/01/2017 FINAL  Final  Culture, respiratory (NON-Expectorated)     Status: None (Preliminary result)   Collection Time: 07/01/17  3:00 PM  Result Value Ref Range Status   Specimen Description EXPECTORATED SPUTUM  Final   Special Requests NONE Reflexed from [id number]  Final   Gram Stain   Final    MODERATE WBC PRESENT, PREDOMINANTLY PMN RARE BUDDING YEAST SEEN RARE GRAM POSITIVE COCCI IN PAIRS NO SQUAMOUS EPITHELIAL CELLS SEEN    Culture   Final    CULTURE REINCUBATED FOR BETTER GROWTH Performed at [facility], [address] [place], [place] [address]    Report Status PENDING  Incomplete         Radiology Studies: Dg Chest 2 View  Result Date: 06/30/2017 CLINICAL DATA:  Productive cough. Finish treatment for pneumonia without relief. EXAM: CHEST  2 VIEW COMPARISON:  06/21/2017 FINDINGS: Enlarged cardiac silhouette. Calcific atherosclerotic disease and tortuosity of the aorta. There is persistent patchy airspace consolidation versus  pulmonary nodules in bilateral lower lobes. Relative hypoinflation of the lungs. No evidence of pneumothorax or large pleural effusion. Osseus structures are stable. IMPRESSION: Persistent patchy airspace consolidation versus pulmonary nodules in bilateral lower lobes. Given the persistence of this abnormality malignancy cannot be excluded. Electronically Signed   By: Ted Mcalpine M.D.   On: 06/30/2017 17:11   Ct Chest W  Contrast  Result Date: 06/30/2017 CLINICAL DATA:  Pneumonia, recurrent urinary tract infection, productive cough, hypoxia, fever other EXAM: CT CHEST WITH CONTRAST TECHNIQUE: Multidetector CT imaging of the chest was performed during intravenous contrast administration. CONTRAST:  60mL ISOVUE-300 IOPAMIDOL (ISOVUE-300) INJECTION 61% COMPARISON:  06/30/2017 chest x-ray FINDINGS: Cardiovascular: Atherosclerosis of the thoracic aorta. Two vessel arch anatomy appearing patent and tortuous. No significant aneurysm or dissection. Visualized central pulmonary arteries appear patent. Cardiomegaly noted. Native coronary atherosclerosis present. Negative for pericardial effusion. Mediastinum/Nodes: Small prominent scattered mediastinal and hilar lymph nodes, suspect reactive. Trachea and central airways appear patent. No hiatal hernia. Lungs/Pleura: Background emphysema pattern most pronounced in the upper lobes. Mild associated peripheral interstitial fibrotic pattern in the upper lobes. Dependent bilateral lower lobe as well as right upper lobe and left upper lobe patchy and nodular airspace process compatible with bilateral pneumonia. Pneumonia is most consolidated in the right lower lobe. No associated interstitial edema. No pneumothorax. Trace left pleural effusion medially. Upper Abdomen: Abdominal atherosclerosis noted. Cholelithiasis present. Suspect left renal cysts. No acute upper abdominal process. Musculoskeletal: Degenerative changes noted spine and associated scoliosis. No chest wall  soft tissue abnormality or asymmetry. Degenerative changes of the shoulders. Increased thoracic kyphosis noted on the lateral view. No acute compression fracture. Sternum intact. IMPRESSION: Patchy nodular bilateral airspace process / consolidation compatible with bilateral pneumonia. Difficult to exclude component of aspiration. Underlying chronic emphysema and mild interstitial fibrosis peripherally. Trace right pleural effusion medially Cardiomegaly without pericardial effusion or associated CHF coronary atherosclerosis Aortic Atherosclerosis (ICD10-I70.0) and Emphysema (ICD10-J43.9). Electronically Signed   By: Judie Petit.  Shick M.D.   On: 06/30/2017 19:17        Scheduled Meds: . allopurinol  200 mg Oral Daily  . aspirin EC  81 mg Oral Daily  . enoxaparin (LOVENOX) injection  40 mg Subcutaneous Q24H  . ferrous sulfate  325 mg Oral Q breakfast  . finasteride  5 mg Oral Daily  . gabapentin  100 mg Oral QHS  . insulin aspart  0-15 Units Subcutaneous TID WC  . levothyroxine  125 mcg Oral QAC breakfast  . predniSONE  5 mg Oral Q breakfast  . senna  1 tablet Oral QHS  . traZODone  50 mg Oral QHS   Continuous Infusions: . ceFEPime (MAXIPIME) IV Stopped (07/02/17 1037)  . vancomycin       LOS: 2 days    Time spent: 45 minutes    Alberteen Sam, MD [facility] 07/02/2017, 2:59 PM     Pager [phone] --- please page though AMION:  [email or web address] Password [email or web address] If 7PM-7AM, please contact night-coverage

## 2017-07-03 LAB — BASIC METABOLIC PANEL
Anion gap: 7 (ref 5–15)
BUN: 18 mg/dL (ref 6–20)
CHLORIDE: 104 mmol/L (ref 101–111)
CO2: 26 mmol/L (ref 22–32)
CREATININE: 1.12 mg/dL (ref 0.61–1.24)
Calcium: 8.4 mg/dL — ABNORMAL LOW (ref 8.9–10.3)
GFR calc Af Amer: >60 mL/min (ref >60)
GFR calc non Af Amer: 53 mL/min — ABNORMAL LOW (ref >60)
Glucose, Bld: 92 mg/dL (ref 65–99)
POTASSIUM: 3.6 mmol/L (ref 3.5–5.1)
SODIUM: 137 mmol/L (ref 135–145)

## 2017-07-03 LAB — CBC
HEMATOCRIT: 26.4 % — AB (ref 39.0–52.0)
Hemoglobin: 8.5 g/dL — ABNORMAL LOW (ref 13.0–17.0)
MCH: 30.1 pg (ref 26.0–34.0)
MCHC: 32.2 g/dL (ref 30.0–36.0)
MCV: 93.6 fL (ref 78.0–100.0)
Platelets: 203 10*3/uL (ref 150–400)
RBC: 2.82 MIL/uL — AB (ref 4.22–5.81)
RDW: 14.4 % (ref 11.5–15.5)
WBC: 12.5 10*3/uL — AB (ref 4.0–10.5)

## 2017-07-03 LAB — GLUCOSE, CAPILLARY
GLUCOSE-CAPILLARY: 263 mg/dL — AB (ref 65–99)
GLUCOSE-CAPILLARY: 136 mg/dL — AB (ref 65–99)
Glucose-Capillary: 109 mg/dL — ABNORMAL HIGH (ref 65–99)
Glucose-Capillary: 227 mg/dL — ABNORMAL HIGH (ref 65–99)

## 2017-07-03 MED ORDER — FUROSEMIDE 20 MG PO TABS
20.0000 mg | ORAL_TABLET | Freq: Every day | ORAL | Status: DC
  Administered 2017-07-04: 20 mg via ORAL
  Filled 2017-07-03: qty 1

## 2017-07-03 NOTE — Progress Notes (Signed)
RT instructed the Pt on his flutter. The Pt is clear but has a congested cough. The Pt seemed a little SOB when he coughed but his SATS and HR were stable. The Pt wasn't able to cough any mucus up. RT will continue to monitor

## 2017-07-03 NOTE — Progress Notes (Signed)
PROGRESS NOTE    Carlos Morton  QIO:[id number] DOB: 1917/04/26 DOA: 06/30/2017 PCP: Karle Plumber, MD      Brief Narrative:  83 yo M with Afib not on AC, DM, COPD, and CHF EF 35% and recent CAP who presents with persistent cough despite finishing course of Cefpodoxime.      Assessment & Plan:  Principal Problem:   HCAP (healthcare-associated pneumonia) Active Problems:   DM (diabetes mellitus), type 2 (HCC)   Atrial fibrillation with controlled ventricular response (HCC)   Acute lower UTI   Chronic systolic CHF (congestive heart failure) (HCC)   COPD (chronic obstructive pulmonary disease) (HCC)   CKD (chronic kidney disease) stage 3, GFR 30-59 ml/min (HCC)   Pressure injury of skin   Persistent pneumonia CT on admission showed bilateral lower, worst in RLL but also in bilateral upper lobes pneumonia.  SLP consult during last hospitalization suggested his swallow was well, didn't recommend MBS or diet modification, however, distribution (and nursing note of cough with swallowing) suggest aspiration contributing, seems very likely given age.  MRSA negative.  Blood cultures negative.  Failure either from inadequate coverage with cefpodoxime (doubted) versus poor clearance, poor lung function given advanced age.   -Continue cefepime -Stop vanc -Follow sputum culture -Continue chest PT    Asymptomatic leukocyturia Unclear significance, covered by Cef  Chronic systolic CHF EF 35%.  Currently euvolemic. -Restart oral Lasix tomorrow, at lower dose than home (once as opposed to twice daily) for now -Hold ACEi for now  COPD No wheezing. -Albuterol prn  Diabetes -Hold metformin -SSI   Chronic Atrial fibrillation CHADS2Vasc at least 5, not on AC.  Currently rate controlled off meds.  Hypothyroidism: -Continue levothyroxine  Other medications -Continue allopurinol, finasteride, gabapentin, trazodone -Continue prednisone        DVT prophylaxis:  Lovenox Code Status: DO NOT RESUSCITATE Family Communication: None present Disposition Plan: IV antibiotics, follow sputum culture.  Chest PT.  Likely will need 2-3 more days IV antibiotics, and chest PT.  Would prefer to have culture results prior to discharge, given failure before.   After that, to SNF.  Overall prognosis guarded and this was discussed with daughter.   Consultants:   None  Procedures:   None  Antimicrobials:   Vancomycin 12/27 >> 12/29  Cefepime 12/27 >>   Subjective: Chills.  Rattling cough.  No confusion, new fever, new dyspnea, new chest pain.    Objective: Vitals:   07/02/17 0635 07/02/17 1453 07/02/17 2103 07/03/17 0459  BP: (!) 104/50 113/71 119/62 124/68  Pulse: 69 68 (!) 58 66  Resp: 16 18 16 16   Temp: 98.2 F (36.8 C) 98.2 F (36.8 C) 97.9 F (36.6 C) (!) 97.4 F (36.3 C)  TempSrc: Oral Oral Oral Oral  SpO2: 98% 100% 100% 98%  Weight:      Height:        Intake/Output Summary (Last 24 hours) at 07/03/2017 1336 Last data filed at 07/03/2017 3244 Gross per 24 hour  Intake 1165 ml  Output 850 ml  Net 315 ml   Filed Weights   06/30/17 1550  Weight: 66.2 kg (146 lb)    Examination: General appearance: Very elderly adult male, alert and in no acute distress on nasal cannula.   HEENT: Anicteric, conjunctiva pink, lids and lashes thin. No nasal deformity, discharge, epistaxis.  Lipsdry.   Skin: Warm and dry.  No jaundice.  No suspicious rashes or lesions. Cardiac: Irregular rhythm, regular rate, nl S1-S2, no murmurs appreciated.  Capillary refill is brisk.  JVP not elevated.  No LE edema.  Radial pulses 2+ and symmetric. Respiratory: Increased rate subtly.  Rales thorughout.  Diminished throughout.  No wheezes. Abdomen: Abdomen soft.  No TTP. No ascites, distension, hepatosplenomegaly.   MSK: No deformities or effusions.  Diffuse loss of muscle mass and fat. Neuro: Awake and alert.  EOMI, moves all extremities. Speech fluent.    Psych:  Sensorium intact and responding to questions, attention normal. Affect normal.  Judgment and insight appear normal.    Data Reviewed: I have personally reviewed following labs and imaging studies:  CBC: Recent Labs  Lab 06/30/17 1631 07/03/17 0428  WBC 13.8* 12.5*  NEUTROABS 11.6*  --   HGB 9.4* 8.5*  HCT 29.0* 26.4*  MCV 92.4 93.6  PLT 252 203   Basic Metabolic Panel: Recent Labs  Lab 06/30/17 1631 07/01/17 0344 07/03/17 0428  NA 136 136 137  K 4.8 3.5 3.6  CL 101 103 104  CO2 27 26 26   GLUCOSE 179* 93 92  BUN 24* 22* 18  CREATININE 1.31* 1.22 1.12  CALCIUM 9.0 8.1* 8.4*   GFR: Estimated Creatinine Clearance: 34.5 mL/min (by C-G formula based on SCr of 1.12 mg/dL). Liver Function Tests: No results for input(s): AST, ALT, ALKPHOS, BILITOT, PROT, ALBUMIN in the last 168 hours. No results for input(s): LIPASE, AMYLASE in the last 168 hours. No results for input(s): AMMONIA in the last 168 hours. Coagulation Profile: No results for input(s): INR, PROTIME in the last 168 hours. Cardiac Enzymes: No results for input(s): CKTOTAL, CKMB, CKMBINDEX, TROPONINI in the last 168 hours. BNP (last 3 results) No results for input(s): PROBNP in the last 8760 hours. HbA1C: No results for input(s): HGBA1C in the last 72 hours. CBG: Recent Labs  Lab 07/02/17 1706 07/02/17 1738 07/02/17 2046 07/03/17 0730 07/03/17 1214  GLUCAP 51* 92 187* 109* 227*   Lipid Profile: No results for input(s): CHOL, HDL, LDLCALC, TRIG, CHOLHDL, LDLDIRECT in the last 72 hours. Thyroid Function Tests: No results for input(s): TSH, T4TOTAL, FREET4, T3FREE, THYROIDAB in the last 72 hours. Anemia Panel: No results for input(s): VITAMINB12, FOLATE, FERRITIN, TIBC, IRON, RETICCTPCT in the last 72 hours. Urine analysis:    Component Value Date/Time   COLORURINE YELLOW 06/30/2017 2009   APPEARANCEUR HAZY (A) 06/30/2017 2009   LABSPEC 1.013 06/30/2017 2009   PHURINE 5.0 06/30/2017 2009   GLUCOSEU  NEGATIVE 06/30/2017 2009   HGBUR SMALL (A) 06/30/2017 2009   BILIRUBINUR NEGATIVE 06/30/2017 2009   KETONESUR NEGATIVE 06/30/2017 2009   PROTEINUR NEGATIVE 06/30/2017 2009   UROBILINOGEN 1.0 12/29/2013 0201   NITRITE NEGATIVE 06/30/2017 2009   LEUKOCYTESUR MODERATE (A) 06/30/2017 2009   Sepsis Labs: @LABRCNTIP (procalcitonin:4,lacticacidven:4)  ) Recent Results (from the past 240 hour(s))  Culture, sputum-assessment     Status: None   Collection Time: 06/24/17  4:43 PM  Result Value Ref Range Status   Specimen Description EXPECTORATED SPUTUM  Final   Special Requests NONE  Final   Sputum evaluation THIS SPECIMEN IS ACCEPTABLE FOR SPUTUM CULTURE  Final   Report Status 06/24/2017 FINAL  Final  Culture, respiratory (NON-Expectorated)     Status: None   Collection Time: 06/24/17  4:43 PM  Result Value Ref Range Status   Specimen Description EXPECTORATED SPUTUM  Final   Special Requests NONE Reflexed from [id number]  Final   Gram Stain   Final    MODERATE WBC PRESENT,BOTH PMN AND MONONUCLEAR FEW GRAM VARIABLE ROD RARE GRAM POSITIVE COCCI  RARE YEAST    Culture   Final    Consistent with normal respiratory flora. Performed at [facility], [address] [place], [place] [address]    Report Status 06/27/2017 FINAL  Final  Culture, blood (Routine X 2) w Reflex to ID Panel     Status: None (Preliminary result)   Collection Time: 06/30/17  8:01 PM  Result Value Ref Range Status   Specimen Description BLOOD RIGHT ANTECUBITAL  Final   Special Requests   Final    BOTTLES DRAWN AEROBIC AND ANAEROBIC Blood Culture adequate volume   Culture   Final    NO GROWTH 3 DAYS Performed at [facility], [address] [place], [place] [address]    Report Status PENDING  Incomplete  Urine Culture     Status: Abnormal   Collection Time: 06/30/17  8:09 PM  Result Value Ref Range Status   Specimen Description URINE, CLEAN CATCH  Final   Special Requests NONE  Final   Culture MULTIPLE  SPECIES PRESENT, SUGGEST RECOLLECTION (A)  Final   Report Status 07/02/2017 FINAL  Final  Culture, blood (Routine X 2) w Reflex to ID Panel     Status: None (Preliminary result)   Collection Time: 06/30/17  8:13 PM  Result Value Ref Range Status   Specimen Description BLOOD LEFT ANTECUBITAL  Final   Special Requests   Final    BOTTLES DRAWN AEROBIC AND ANAEROBIC Blood Culture adequate volume   Culture   Final    NO GROWTH 3 DAYS Performed at [facility], [address] [place], [place] [address]    Report Status PENDING  Incomplete  Culture, sputum-assessment     Status: None   Collection Time: 07/01/17  3:00 PM  Result Value Ref Range Status   Specimen Description EXPECTORATED SPUTUM  Final   Special Requests NONE  Final   Sputum evaluation THIS SPECIMEN IS ACCEPTABLE FOR SPUTUM CULTURE  Final   Report Status 07/01/2017 FINAL  Final  Culture, respiratory (NON-Expectorated)     Status: None (Preliminary result)   Collection Time: 07/01/17  3:00 PM  Result Value Ref Range Status   Specimen Description EXPECTORATED SPUTUM  Final   Special Requests NONE Reflexed from [id number]  Final   Gram Stain   Final    MODERATE WBC PRESENT, PREDOMINANTLY PMN RARE BUDDING YEAST SEEN RARE GRAM POSITIVE COCCI IN PAIRS NO SQUAMOUS EPITHELIAL CELLS SEEN    Culture   Final    CULTURE REINCUBATED FOR BETTER GROWTH Performed at [facility], [address] [place], [place] [address]    Report Status PENDING  Incomplete  MRSA PCR Screening     Status: None   Collection Time: 07/02/17  4:45 PM  Result Value Ref Range Status   MRSA by PCR NEGATIVE NEGATIVE Final    Comment:        The GeneXpert MRSA Assay (FDA approved for NASAL specimens only), is one component of a comprehensive MRSA colonization surveillance program. It is not intended to diagnose MRSA infection nor to guide or monitor treatment for MRSA infections.          Radiology Studies: No results  found.      Scheduled Meds: . allopurinol  200 mg Oral Daily  . aspirin EC  81 mg Oral Daily  . enoxaparin (LOVENOX) injection  40 mg Subcutaneous Q24H  . ferrous sulfate  325 mg Oral Q breakfast  . finasteride  5  mg Oral Daily  . gabapentin  100 mg Oral QHS  . insulin aspart  0-15 Units Subcutaneous TID WC  . levothyroxine  125 mcg Oral QAC breakfast  . predniSONE  5 mg Oral Q breakfast  . senna  1 tablet Oral QHS  . traZODone  50 mg Oral QHS   Continuous Infusions: . ceFEPime (MAXIPIME) IV 1 g (07/03/17 1013)     LOS: 3 days    Time spent: 15 minutes    Alberteen Samhristopher P Earley Grobe, MD [facility] 07/03/2017, 1:36 PM     Pager [phone] --- please page though AMION:  [email or web address] Password [email or web address] If 7PM-7AM, please contact night-coverage

## 2017-07-03 NOTE — Progress Notes (Addendum)
Pharmacy Antibiotic Note  Carlos Morton is a 83 y.o. male admitted on 06/30/2017 with pneumonia.  Pharmacy has been consulted for cefepime dosing. MRSA screen negative and vancomycin discontinued today.  Plan: Continue Cefepime 1g IV q12h  Height: 5\' 8"  (172.7 cm) Weight: 146 lb (66.2 kg) IBW/kg (Calculated) : 68.4  Temp (24hrs), Avg:97.8 F (36.6 C), Min:97.4 F (36.3 C), Max:98.2 F (36.8 C)  Recent Labs  Lab 06/30/17 1631 06/30/17 2022 06/30/17 2249 07/01/17 0344 07/03/17 0428  WBC 13.8*  --   --   --  12.5*  CREATININE 1.31*  --   --  1.22 1.12  LATICACIDVEN  --  3.47* 0.81  --   --     Estimated Creatinine Clearance: 34.5 mL/min (by C-G formula based on SCr of 1.12 mg/dL).    Allergies  Allergen Reactions  . Codeine Other (See Comments)    unknown  . Demerol [Meperidine] Other (See Comments)     Unknown per pt  mar  . Diltiazem Other (See Comments)     Unknown per pt  mar  . Penicillins Other (See Comments)     Unknown per pt  mar  . Sulfa Drugs Cross Reactors Hives    [facility]    Antimicrobials this admission: 12/26 vancomycin >> 12/29 12/26 cefepime >>   Dose adjustments this admission:  Microbiology results: 12/26 BCx: ngtd 12/26 UCx: multiple species 12/27 Respiratory: rare yeast, rare GPC pair, Cxt reincubated 12/28 MRSA PCR: neg  Thank you for allowing pharmacy to be a part of this patient's care.  Clance BollRunyon, Diantha Paxson 07/03/2017 7:00 AM

## 2017-07-04 LAB — BASIC METABOLIC PANEL
Anion gap: 5 (ref 5–15)
BUN: 17 mg/dL (ref 6–20)
CALCIUM: 8.3 mg/dL — AB (ref 8.9–10.3)
CO2: 27 mmol/L (ref 22–32)
CREATININE: 1.15 mg/dL (ref 0.61–1.24)
Chloride: 106 mmol/L (ref 101–111)
GFR calc Af Amer: 59 mL/min — ABNORMAL LOW (ref >60)
GFR, EST NON AFRICAN AMERICAN: 51 mL/min — AB (ref >60)
Glucose, Bld: 110 mg/dL — ABNORMAL HIGH (ref 65–99)
Potassium: 3.5 mmol/L (ref 3.5–5.1)
SODIUM: 138 mmol/L (ref 135–145)

## 2017-07-04 LAB — CBC
HCT: 25 % — ABNORMAL LOW (ref 39.0–52.0)
Hemoglobin: 7.9 g/dL — ABNORMAL LOW (ref 13.0–17.0)
MCH: 29.4 pg (ref 26.0–34.0)
MCHC: 31.6 g/dL (ref 30.0–36.0)
MCV: 92.9 fL (ref 78.0–100.0)
PLATELETS: 185 10*3/uL (ref 150–400)
RBC: 2.69 MIL/uL — ABNORMAL LOW (ref 4.22–5.81)
RDW: 14.3 % (ref 11.5–15.5)
WBC: 11.6 10*3/uL — AB (ref 4.0–10.5)

## 2017-07-04 LAB — CULTURE, RESPIRATORY W GRAM STAIN: Culture: NORMAL

## 2017-07-04 LAB — GLUCOSE, CAPILLARY
GLUCOSE-CAPILLARY: 101 mg/dL — AB (ref 65–99)
GLUCOSE-CAPILLARY: 241 mg/dL — AB (ref 65–99)
Glucose-Capillary: 239 mg/dL — ABNORMAL HIGH (ref 65–99)
Glucose-Capillary: 174 mg/dL — ABNORMAL HIGH (ref 65–99)

## 2017-07-04 LAB — CULTURE, RESPIRATORY

## 2017-07-04 MED ORDER — FUROSEMIDE 10 MG/ML IJ SOLN
20.0000 mg | Freq: Every day | INTRAMUSCULAR | Status: DC
  Administered 2017-07-04 – 2017-07-05 (×2): 20 mg via INTRAVENOUS
  Filled 2017-07-04 (×2): qty 2

## 2017-07-04 NOTE — Progress Notes (Signed)
Pt's HR 71, SAT 91% on 2L. BBS clear and has a congestive cough. RT will continue too monitor

## 2017-07-04 NOTE — Plan of Care (Signed)
  Safety: Ability to remain free from injury will improve 07/04/2017 2220 - Progressing by Antionette CharPeng, Findley Vi P, RN

## 2017-07-04 NOTE — Progress Notes (Signed)
PROGRESS NOTE    Carlos Morton  WUJ:[id number] DOB: 1917/06/27 DOA: 06/30/2017 PCP: Karle Plumber, MD      Brief Narrative:  83 yo M with Afib not on AC, DM, COPD, and CHF EF 35% and recent CAP who presents with persistent cough despite finishing course of Cefpodoxime.      Assessment & Plan:  Principal Problem:   HCAP (healthcare-associated pneumonia) Active Problems:   DM (diabetes mellitus), type 2 (HCC)   Atrial fibrillation with controlled ventricular response (HCC)   Acute lower UTI   Chronic systolic CHF (congestive heart failure) (HCC)   COPD (chronic obstructive pulmonary disease) (HCC)   CKD (chronic kidney disease) stage 3, GFR 30-59 ml/min (HCC)   Pressure injury of skin   Persistent pneumonia HR and WBC normal now.  Sputum culture no growth.  However, appears to be failing to thrive.  Persistently hypoxic, weak.  Given age, frailty, I recommend Hospice be considered. -Continue cefepime for now -Continue chest PT  -Continue O2 -Consult Palliative Care   Acute on chronic systolic CHF EF 35%.  No peripheral edema, but persistently hypoxic. -IV Lasix once daily -Strict I/Os, daily BMP -Hold ACEi for now  Acute hypoxic respiratory failure From pneumonia, possible CHF. -As above  Encephalopathy/delirium Intermittent, per nursing.  To me seems somewhat disoriented as well, but redirectable.  Poor prognostic sign.  Asymptomatic leukocyturia Unclear significance, covered by Cefepime  COPD No wheezing. -Albuterol prn  Diabetes -Hold metformin -SSI   Chronic Atrial fibrillation CHADS2Vasc at least 5, not on AC.  Currently rate controlled off meds.  Hypothyroidism: -Continue levothyroxine  Other medications -Continue allopurinol, finasteride, gabapentin, trazodone -Continue prednisone        DVT prophylaxis: Lovenox Code Status: DO NOT RESUSCITATE Family Communication: Son by phone Disposition Plan: IV antibiotics.  Furosemide IV.   Consult to Palliative Care.  If he becomes more alert, and is able to walk with PT in 2-3 days, he may be able to progress to SNF discharge, however, if he continues to be weak, listless, and intermittently delirious, it may be more reasonable to consult Hospice for follow up after discharge.     Consultants:   Palliative Care  Procedures:   None  Antimicrobials:   Vancomycin 12/27 >> 12/29  Cefepime 12/27 >>   Subjective: Very weak.  No dyspnea.  No new dyspnea.  No swelling. Rattling cough.  No new fever, new chest pain.    Objective: Vitals:   07/03/17 0459 07/03/17 1412 07/03/17 2040 07/04/17 0452  BP: 124/68 120/65 123/71 113/61  Pulse: 66 74 75 60  Resp: 16 16 16 16   Temp: (!) 97.4 F (36.3 C) 98 F (36.7 C) 98.3 F (36.8 C) 97.8 F (36.6 C)  TempSrc: Oral Oral Oral Oral  SpO2: 98% 98% 98% 96%  Weight:      Height:        Intake/Output Summary (Last 24 hours) at 07/04/2017 1108 Last data filed at 07/04/2017 0453 Gross per 24 hour  Intake 240 ml  Output 250 ml  Net -10 ml   Filed Weights   06/30/17 1550  Weight: 66.2 kg (146 lb)    Examination: General appearance: Very elderly adult male,  Sleeping, slow to rouse.   HEENT: Anicteric, conjunctiva pink, lids and lashes thin. No nasal deformity, discharge, epistaxis.  Lips dry.   Skin: Warm and dry.  No jaundice.  No suspicious rashes or lesions. Cardiac: Irregular rhythm, regular rate, nl S1-S2, no murmurs appreciated.  Respiratory: Diminished throughout.  No wheezes. Abdomen: Abdomen soft.  No TTP. No ascites, distension, hepatosplenomegaly.   MSK: No deformities or effusions.  Diffuse loss of muscle mass and fat. Neuro: Sleeping, rousable, globally very weak.  Not oriented to time, slightly inattentive to me.  Easily re-oriented to location.       Data Reviewed: I have personally reviewed following labs and imaging studies:  CBC: Recent Labs  Lab 06/30/17 1631 07/03/17 0428 07/04/17 0312    WBC 13.8* 12.5* 11.6*  NEUTROABS 11.6*  --   --   HGB 9.4* 8.5* 7.9*  HCT 29.0* 26.4* 25.0*  MCV 92.4 93.6 92.9  PLT 252 203 185   Basic Metabolic Panel: Recent Labs  Lab 06/30/17 1631 07/01/17 0344 07/03/17 0428 07/04/17 0312  NA 136 136 137 138  K 4.8 3.5 3.6 3.5  CL 101 103 104 106  CO2 27 26 26 27   GLUCOSE 179* 93 92 110*  BUN 24* 22* 18 17  CREATININE 1.31* 1.22 1.12 1.15  CALCIUM 9.0 8.1* 8.4* 8.3*   GFR: Estimated Creatinine Clearance: 33.6 mL/min (by C-G formula based on SCr of 1.15 mg/dL). Liver Function Tests: No results for input(s): AST, ALT, ALKPHOS, BILITOT, PROT, ALBUMIN in the last 168 hours. No results for input(s): LIPASE, AMYLASE in the last 168 hours. No results for input(s): AMMONIA in the last 168 hours. Coagulation Profile: No results for input(s): INR, PROTIME in the last 168 hours. Cardiac Enzymes: No results for input(s): CKTOTAL, CKMB, CKMBINDEX, TROPONINI in the last 168 hours. BNP (last 3 results) No results for input(s): PROBNP in the last 8760 hours. HbA1C: No results for input(s): HGBA1C in the last 72 hours. CBG: Recent Labs  Lab 07/03/17 0730 07/03/17 1214 07/03/17 1705 07/03/17 2025 07/04/17 0732  GLUCAP 109* 227* 263* 136* 101*   Lipid Profile: No results for input(s): CHOL, HDL, LDLCALC, TRIG, CHOLHDL, LDLDIRECT in the last 72 hours. Thyroid Function Tests: No results for input(s): TSH, T4TOTAL, FREET4, T3FREE, THYROIDAB in the last 72 hours. Anemia Panel: No results for input(s): VITAMINB12, FOLATE, FERRITIN, TIBC, IRON, RETICCTPCT in the last 72 hours. Urine analysis:    Component Value Date/Time   COLORURINE YELLOW 06/30/2017 2009   APPEARANCEUR HAZY (A) 06/30/2017 2009   LABSPEC 1.013 06/30/2017 2009   PHURINE 5.0 06/30/2017 2009   GLUCOSEU NEGATIVE 06/30/2017 2009   HGBUR SMALL (A) 06/30/2017 2009   BILIRUBINUR NEGATIVE 06/30/2017 2009   KETONESUR NEGATIVE 06/30/2017 2009   PROTEINUR NEGATIVE 06/30/2017 2009    UROBILINOGEN 1.0 12/29/2013 0201   NITRITE NEGATIVE 06/30/2017 2009   LEUKOCYTESUR MODERATE (A) 06/30/2017 2009   Sepsis Labs: @LABRCNTIP (procalcitonin:4,lacticacidven:4)  ) Recent Results (from the past 240 hour(s))  Culture, sputum-assessment     Status: None   Collection Time: 06/24/17  4:43 PM  Result Value Ref Range Status   Specimen Description EXPECTORATED SPUTUM  Final   Special Requests NONE  Final   Sputum evaluation THIS SPECIMEN IS ACCEPTABLE FOR SPUTUM CULTURE  Final   Report Status 06/24/2017 FINAL  Final  Culture, respiratory (NON-Expectorated)     Status: None   Collection Time: 06/24/17  4:43 PM  Result Value Ref Range Status   Specimen Description EXPECTORATED SPUTUM  Final   Special Requests NONE Reflexed from [id number]  Final   Gram Stain   Final    MODERATE WBC PRESENT,BOTH PMN AND MONONUCLEAR FEW GRAM VARIABLE ROD RARE GRAM POSITIVE COCCI RARE YEAST    Culture   Final  Consistent with normal respiratory flora. Performed at [facility], [address] [place], [place] [address]    Report Status 06/27/2017 FINAL  Final  Culture, blood (Routine X 2) w Reflex to ID Panel     Status: None (Preliminary result)   Collection Time: 06/30/17  8:01 PM  Result Value Ref Range Status   Specimen Description BLOOD RIGHT ANTECUBITAL  Final   Special Requests   Final    BOTTLES DRAWN AEROBIC AND ANAEROBIC Blood Culture adequate volume   Culture   Final    NO GROWTH 3 DAYS Performed at [facility], [address] [place], [place] [address]    Report Status PENDING  Incomplete  Urine Culture     Status: Abnormal   Collection Time: 06/30/17  8:09 PM  Result Value Ref Range Status   Specimen Description URINE, CLEAN CATCH  Final   Special Requests NONE  Final   Culture MULTIPLE SPECIES PRESENT, SUGGEST RECOLLECTION (A)  Final   Report Status 07/02/2017 FINAL  Final  Culture, blood (Routine X 2) w Reflex to ID Panel     Status: None (Preliminary  result)   Collection Time: 06/30/17  8:13 PM  Result Value Ref Range Status   Specimen Description BLOOD LEFT ANTECUBITAL  Final   Special Requests   Final    BOTTLES DRAWN AEROBIC AND ANAEROBIC Blood Culture adequate volume   Culture   Final    NO GROWTH 3 DAYS Performed at [facility], [address] [place], [place] [address]    Report Status PENDING  Incomplete  Culture, sputum-assessment     Status: None   Collection Time: 07/01/17  3:00 PM  Result Value Ref Range Status   Specimen Description EXPECTORATED SPUTUM  Final   Special Requests NONE  Final   Sputum evaluation THIS SPECIMEN IS ACCEPTABLE FOR SPUTUM CULTURE  Final   Report Status 07/01/2017 FINAL  Final  Culture, respiratory (NON-Expectorated)     Status: None   Collection Time: 07/01/17  3:00 PM  Result Value Ref Range Status   Specimen Description EXPECTORATED SPUTUM  Final   Special Requests NONE Reflexed from [id number]  Final   Gram Stain   Final    MODERATE WBC PRESENT, PREDOMINANTLY PMN RARE BUDDING YEAST SEEN RARE GRAM POSITIVE COCCI IN PAIRS NO SQUAMOUS EPITHELIAL CELLS SEEN    Culture   Final    Consistent with normal respiratory flora. Performed at [facility], [address] [place], [place] [address]    Report Status 07/04/2017 FINAL  Final  MRSA PCR Screening     Status: None   Collection Time: 07/02/17  4:45 PM  Result Value Ref Range Status   MRSA by PCR NEGATIVE NEGATIVE Final    Comment:        The GeneXpert MRSA Assay (FDA approved for NASAL specimens only), is one component of a comprehensive MRSA colonization surveillance program. It is not intended to diagnose MRSA infection nor to guide or monitor treatment for MRSA infections.          Radiology Studies: No results found.      Scheduled Meds: . allopurinol  200 mg Oral Daily  . aspirin EC  81 mg Oral Daily  . enoxaparin (LOVENOX) injection  40 mg Subcutaneous Q24H  . ferrous sulfate  325 mg Oral Q  breakfast  . finasteride  5 mg Oral Daily  . furosemide  20 mg Oral Daily  .  gabapentin  100 mg Oral QHS  . insulin aspart  0-15 Units Subcutaneous TID WC  . levothyroxine  125 mcg Oral QAC breakfast  . predniSONE  5 mg Oral Q breakfast  . senna  1 tablet Oral QHS  . traZODone  50 mg Oral QHS   Continuous Infusions: . ceFEPime (MAXIPIME) IV 1 g (07/04/17 0951)     LOS: 4 days    Time spent: 20 minutes    Alberteen Sam, MD [facility] 07/04/2017, 11:08 AM     Pager [phone] --- please page though AMION:  [email or web address] If 7PM-7AM, please contact night-coverage

## 2017-07-05 DIAGNOSIS — J189 Pneumonia, unspecified organism: Principal | ICD-10-CM

## 2017-07-05 DIAGNOSIS — I4891 Unspecified atrial fibrillation: Secondary | ICD-10-CM

## 2017-07-05 DIAGNOSIS — Z7189 Other specified counseling: Secondary | ICD-10-CM

## 2017-07-05 DIAGNOSIS — I5022 Chronic systolic (congestive) heart failure: Secondary | ICD-10-CM

## 2017-07-05 DIAGNOSIS — N183 Chronic kidney disease, stage 3 (moderate): Secondary | ICD-10-CM

## 2017-07-05 DIAGNOSIS — Z515 Encounter for palliative care: Secondary | ICD-10-CM

## 2017-07-05 LAB — GLUCOSE, CAPILLARY
GLUCOSE-CAPILLARY: 186 mg/dL — AB (ref 65–99)
GLUCOSE-CAPILLARY: 158 mg/dL — AB (ref 65–99)
Glucose-Capillary: 120 mg/dL — ABNORMAL HIGH (ref 65–99)
Glucose-Capillary: 220 mg/dL — ABNORMAL HIGH (ref 65–99)

## 2017-07-05 LAB — CBC
HEMATOCRIT: 26.6 % — AB (ref 39.0–52.0)
HEMOGLOBIN: 8.5 g/dL — AB (ref 13.0–17.0)
MCH: 29.7 pg (ref 26.0–34.0)
MCHC: 32 g/dL (ref 30.0–36.0)
MCV: 93 fL (ref 78.0–100.0)
Platelets: 199 10*3/uL (ref 150–400)
RBC: 2.86 MIL/uL — ABNORMAL LOW (ref 4.22–5.81)
RDW: 14.3 % (ref 11.5–15.5)
WBC: 12.4 10*3/uL — AB (ref 4.0–10.5)

## 2017-07-05 LAB — BASIC METABOLIC PANEL
ANION GAP: 8 (ref 5–15)
BUN: 15 mg/dL (ref 6–20)
CHLORIDE: 102 mmol/L (ref 101–111)
CO2: 27 mmol/L (ref 22–32)
Calcium: 8.2 mg/dL — ABNORMAL LOW (ref 8.9–10.3)
Creatinine, Ser: 1.24 mg/dL (ref 0.61–1.24)
GFR calc Af Amer: 54 mL/min — ABNORMAL LOW (ref >60)
GFR calc non Af Amer: 47 mL/min — ABNORMAL LOW (ref >60)
Glucose, Bld: 87 mg/dL (ref 65–99)
POTASSIUM: 3.3 mmol/L — AB (ref 3.5–5.1)
SODIUM: 137 mmol/L (ref 135–145)

## 2017-07-05 LAB — CULTURE, BLOOD (ROUTINE X 2)
CULTURE: NO GROWTH
Culture: NO GROWTH
SPECIAL REQUESTS: ADEQUATE
Special Requests: ADEQUATE

## 2017-07-05 MED ORDER — BENZONATATE 100 MG PO CAPS: 100.0000 mg | ORAL_CAPSULE | Freq: Three times a day (TID) | ORAL | Status: DC | PRN

## 2017-07-05 MED ORDER — POTASSIUM CHLORIDE CRYS ER 20 MEQ PO TBCR
40.0000 meq | EXTENDED_RELEASE_TABLET | Freq: Once | ORAL | Status: AC
  Administered 2017-07-05: 40 meq via ORAL
  Filled 2017-07-05: qty 2

## 2017-07-05 NOTE — Evaluation (Signed)
Physical Therapy Evaluation Patient Details Name: Carlos Morton MRN: [id number] DOB: Nov 21, 1916 Today's Date: 07/05/2017   History of Present Illness  83 year old male with a past medical history of atrial fibrillation not on anticoagulation, diabetes, COPD, chronic systolic CHF with ejection fraction of 35% who was recently hospitalized for pneumonia who presented again with a persistent cough and was found to have consolidation in the lung  Clinical Impression  Pt admitted with above diagnosis. Pt currently with functional limitations due to the deficits listed below (see PT Problem List).  Pt will benefit from skilled PT to increase their independence and safety with mobility to allow discharge to the venue listed below.  Pt requiring at least min assist at this time due to unsteadiness with standing and mobility.  Pt assisted with ambulating as tolerated and then pulled chair behind pt to sit when fatigued.  Family meeting with palliative care during time of evaluation.  Follow up PT pending pt and family wishes.     Follow Up Recommendations Home health PT(at ALF)    Equipment Recommendations  None recommended by PT    Recommendations for Other Services       Precautions / Restrictions Precautions Precautions: Fall Precaution Comments: pt reports 2L O2 [place] baseline      Mobility  Bed Mobility Overal bed mobility: Needs Assistance Bed Mobility: Supine to Sit     Supine to sit: Min guard;HOB elevated     General bed mobility comments: provided a hand for pt to self assist  Transfers Overall transfer level: Needs assistance Equipment used: Rolling walker (2 wheeled) Transfers: Sit to/from Stand Sit to Stand: Min assist         General transfer comment: requires assist to stand and steady, posterior lean present initially, performed twice as pt unable to stand and tie his robe (recommended he sit down for safety)  Ambulation/Gait Ambulation/Gait assistance: Min  assist Ambulation Distance (Feet): 40 Feet Assistive device: Rolling walker (2 wheeled) Gait Pattern/deviations: Step-through pattern;Decreased stride length     General Gait Details: assist to steady intermittently, remained on 2L O2 [place] (pulse oximetry not reading, dyspnea 3/4, seated rest break, rolled back to room in chair  Stairs            Wheelchair Mobility    Modified Rankin (Stroke Patients Only)       Balance Overall balance assessment: Needs assistance         Standing balance support: Bilateral upper extremity supported Standing balance-Leahy Scale: Poor Standing balance comment: requires RW                             Pertinent Vitals/Pain Pain Assessment: No/denies pain    Home Living Family/patient expects to be discharged to:: Assisted living                      Prior Function Level of Independence: Needs assistance   Gait / Transfers Assistance Needed: mod ind with rollator. walks to dining room which is less than 25 feet away per pt  ADL's / Homemaking Assistance Needed: assist for bathing, dressing, meds  Comments: Staff empties leg bag from catheter also     Hand Dominance        Extremity/Trunk Assessment        Lower Extremity Assessment Lower Extremity Assessment: Generalized weakness    Cervical / Trunk Assessment Cervical / Trunk Assessment: Kyphotic  Communication   Communication:  HOH  Cognition Arousal/Alertness: Awake/alert Behavior During Therapy: WFL for tasks assessed/performed Overall Cognitive Status: History of cognitive impairments - at baseline                                 General Comments: slightly confused at times, requesting to get dressed, asking for his rollator (not present)      General Comments      Exercises     Assessment/Plan    PT Assessment Patient needs continued PT services  PT Problem List Decreased mobility;Decreased strength;Decreased activity  tolerance;Decreased knowledge of use of DME;Decreased balance       PT Treatment Interventions DME instruction;Functional mobility training;Balance training;Therapeutic activities;Therapeutic exercise;Patient/family education;Gait training    PT Goals (Current goals can be found in the Care Plan section)  Acute Rehab PT Goals PT Goal Formulation: With patient Time For Goal Achievement: 07/19/17 Potential to Achieve Goals: Fair    Frequency Min 3X/week   Barriers to discharge        Co-evaluation               AM-PAC PT "6 Clicks" Daily Activity  Outcome Measure Difficulty turning over in bed (including adjusting bedclothes, sheets and blankets)?: A Little Difficulty moving from lying on back to sitting on the side of the bed? : A Little Difficulty sitting down on and standing up from a chair with arms (e.g., wheelchair, bedside commode, etc,.)?: Unable Help needed moving to and from a bed to chair (including a wheelchair)?: A Little Help needed walking in hospital room?: A Little Help needed climbing 3-5 steps with a railing? : A Lot 6 Click Score: 15    End of Session Equipment Utilized During Treatment: Oxygen;Gait belt Activity Tolerance: Patient tolerated treatment well Patient left: in chair;with call bell/phone within reach;with chair alarm set;with family/visitor present   PT Visit Diagnosis: Muscle weakness (generalized) (M62.81);Other abnormalities of gait and mobility (R26.89)    Time: 1452-1520 PT Time Calculation (min) (ACUTE ONLY): 28 min   Charges:   PT Evaluation $PT Eval Moderate Complexity: 1 Mod PT Treatments $Gait Training: 8-22 mins   PT G Codes:        Zenovia JarredKati Disney Ruggiero, PT, DPT 07/05/2017 Pager: [phone]  Maida SaleLEMYRE,KATHrine E 07/05/2017, 4:23 PM

## 2017-07-05 NOTE — Progress Notes (Signed)
PT Cancellation Note  Patient Details Name: Denny LevyClayton Kellett MRN: [id number] DOB: 10/12/1916   Cancelled Treatment:    Reason Eval/Treat Not Completed: Fatigue/lethargy limiting ability to participate(requested to sleep at this time, will check back as schedule permits.)   Francys Bolin,KATHrine E 07/05/2017, 10:39 AM Zenovia JarredKati Nick Stults, PT, DPT 07/05/2017 Pager: [phone]

## 2017-07-05 NOTE — Progress Notes (Signed)
Date: July 05, 2017 Chancy Claros, BSN, RN3, CCM [phone] Chart and notes review for patient progress and needs. Will follow for case management and discharge needs. Next review date: [id number] 

## 2017-07-05 NOTE — Progress Notes (Signed)
No charge note:   Palliative consult received.   Meeting scheduled for today 12/31 at 3pm.  Ocie BobKasie Rashida Ladouceur, AGNP-C Palliative Medicine  Please call Palliative Medicine team phone with any questions [phone]. For individual providers please see AMION.

## 2017-07-05 NOTE — Progress Notes (Addendum)
[facility] PROGRESS NOTE  Carlos LevyClayton Morton VWU:[id number] DOB: [birth year] DOA: 06/30/2017  PCP: Karle PlumberArvind, Moogali M, MD  Brief History/Interval Summary: 83 year old Caucasian male with a past medical history of atrial fibrillation not on anticoagulation, diabetes, COPD, chronic systolic CHF with ejection fraction of 35% who was recently hospitalized for pneumonia who presented again with a persistent cough and was found to have consolidation in the lung.  He was hospitalized for further management.  Patient was thought to have failure to thrive.  Considering his other comorbidities and persistent pneumonia palliative medicine was consulted.  Reason for Visit: Persistent pneumonia  Consultants: Palliative medicine  Procedures: None  Antibiotics: Initially on vancomycin.  Stopped on 12/29 Currently on cefepime started on 12/27  Subjective/Interval History: Patient states that he feels weak and fatigued.  Continues to have a cough with brownish expectoration.  Denies any chest pain.  Continues to have some difficulty breathing at times.  ROS: Denies any nausea or vomiting  Objective:  Vital Signs  Vitals:   07/04/17 1344 07/04/17 2012 07/05/17 0505 07/05/17 1231  BP: (!) 128/58 108/60 130/86 103/76  Pulse: 74 64 69 71  Resp: 16 16 18 18   Temp: 97.7 F (36.5 C) 98.2 F (36.8 C) 98 F (36.7 C) 98.2 F (36.8 C)  TempSrc: Oral Oral Oral Oral  SpO2: 94% 92% 93% 95%  Weight:      Height:        Intake/Output Summary (Last 24 hours) at 07/05/2017 1316 Last data filed at 07/05/2017 1100 Gross per 24 hour  Intake 680 ml  Output 2450 ml  Net -1770 ml   Filed Weights   06/30/17 1550  Weight: 66.2 kg (146 lb)    General appearance: alert, cooperative, appears stated age and no distress Head: Normocephalic, without obvious abnormality, atraumatic Resp: Diminished air entry bilaterally.  Crackles noted at the bases.  No wheezing or rhonchi. Cardio: regular rate and  rhythm, S1, S2 normal, no murmur, click, rub or gallop GI: soft, non-tender; bowel sounds normal; no masses,  no organomegaly Extremities: extremities normal, atraumatic, no cyanosis or edema Neurologic: No obvious focal neurological deficits appreciated.  Lab Results:  Data Reviewed: I have personally reviewed following labs and imaging studies  CBC: Recent Labs  Lab 06/30/17 1631 07/03/17 0428 07/04/17 0312 07/05/17 0416  WBC 13.8* 12.5* 11.6* 12.4*  NEUTROABS 11.6*  --   --   --   HGB 9.4* 8.5* 7.9* 8.5*  HCT 29.0* 26.4* 25.0* 26.6*  MCV 92.4 93.6 92.9 93.0  PLT 252 203 185 199    Basic Metabolic Panel: Recent Labs  Lab 06/30/17 1631 07/01/17 0344 07/03/17 0428 07/04/17 0312 07/05/17 0416  NA 136 136 137 138 137  K 4.8 3.5 3.6 3.5 3.3*  CL 101 103 104 106 102  CO2 27 26 26 27 27   GLUCOSE 179* 93 92 110* 87  BUN 24* 22* 18 17 15   CREATININE 1.31* 1.22 1.12 1.15 1.24  CALCIUM 9.0 8.1* 8.4* 8.3* 8.2*    GFR: Estimated Creatinine Clearance: 31.1 mL/min (by C-G formula based on SCr of 1.24 mg/dL).  CBG: Recent Labs  Lab 07/04/17 1215 07/04/17 1629 07/04/17 2010 07/05/17 0745 07/05/17 1200  GLUCAP 241* 239* 174* 120* 220*     Recent Results (from the past 240 hour(s))  Culture, blood (Routine X 2) w Reflex to ID Panel     Status: None   Collection Time: 06/30/17  8:01 PM  Result Value Ref Range Status   Specimen  Description BLOOD RIGHT ANTECUBITAL  Final   Special Requests   Final    BOTTLES DRAWN AEROBIC AND ANAEROBIC Blood Culture adequate volume   Culture   Final    NO GROWTH 5 DAYS Performed at [facility], [address], [place], [place] [address]    Report Status 07/05/2017 FINAL  Final  Urine Culture     Status: Abnormal   Collection Time: 06/30/17  8:09 PM  Result Value Ref Range Status   Specimen Description URINE, CLEAN CATCH  Final   Special Requests NONE  Final   Culture MULTIPLE SPECIES PRESENT, SUGGEST RECOLLECTION (A)   Final   Report Status 07/02/2017 FINAL  Final  Culture, blood (Routine X 2) w Reflex to ID Panel     Status: None   Collection Time: 06/30/17  8:13 PM  Result Value Ref Range Status   Specimen Description BLOOD LEFT ANTECUBITAL  Final   Special Requests   Final    BOTTLES DRAWN AEROBIC AND ANAEROBIC Blood Culture adequate volume   Culture   Final    NO GROWTH 5 DAYS Performed at [facility], [address] [place], [place] [address]    Report Status 07/05/2017 FINAL  Final  Culture, sputum-assessment     Status: None   Collection Time: 07/01/17  3:00 PM  Result Value Ref Range Status   Specimen Description EXPECTORATED SPUTUM  Final   Special Requests NONE  Final   Sputum evaluation THIS SPECIMEN IS ACCEPTABLE FOR SPUTUM CULTURE  Final   Report Status 07/01/2017 FINAL  Final  Culture, respiratory (NON-Expectorated)     Status: None   Collection Time: 07/01/17  3:00 PM  Result Value Ref Range Status   Specimen Description EXPECTORATED SPUTUM  Final   Special Requests NONE Reflexed from [id number]  Final   Gram Stain   Final    MODERATE WBC PRESENT, PREDOMINANTLY PMN RARE BUDDING YEAST SEEN RARE GRAM POSITIVE COCCI IN PAIRS NO SQUAMOUS EPITHELIAL CELLS SEEN    Culture   Final    Consistent with normal respiratory flora. Performed at [facility], [address] [place], [place] [address]    Report Status 07/04/2017 FINAL  Final  MRSA PCR Screening     Status: None   Collection Time: 07/02/17  4:45 PM  Result Value Ref Range Status   MRSA by PCR NEGATIVE NEGATIVE Final    Comment:        The GeneXpert MRSA Assay (FDA approved for NASAL specimens only), is one component of a comprehensive MRSA colonization surveillance program. It is not intended to diagnose MRSA infection nor to guide or monitor treatment for MRSA infections.       Radiology Studies: No results found.   Medications:  Scheduled: . allopurinol  200 mg Oral Daily  . aspirin EC  81 mg  Oral Daily  . enoxaparin (LOVENOX) injection  40 mg Subcutaneous Q24H  . ferrous sulfate  325 mg Oral Q breakfast  . finasteride  5 mg Oral Daily  . furosemide  20 mg Intravenous Daily  . gabapentin  100 mg Oral QHS  . insulin aspart  0-15 Units Subcutaneous TID WC  . levothyroxine  125 mcg Oral QAC breakfast  . predniSONE  5 mg Oral Q breakfast  . senna  1 tablet Oral QHS  . traZODone  50 mg Oral QHS   Continuous: . ceFEPime (MAXIPIME) IV Stopped (07/05/17 1122)   NFA:[id number], albuterol, MUSCLE RUB, polyethylene glycol,  polyvinyl alcohol  Assessment/Plan:  Principal Problem:   HCAP (healthcare-associated pneumonia) Active Problems:   DM (diabetes mellitus), type 2 (HCC)   Atrial fibrillation with controlled ventricular response (HCC)   Acute lower UTI   Chronic systolic CHF (congestive heart failure) (HCC)   COPD (chronic obstructive pulmonary disease) (HCC)   CKD (chronic kidney disease) stage 3, GFR 30-59 ml/min (HCC)   Pressure injury of skin    Persistent pneumonia/HCAP Patient was discharged on oral cefpodoxime during his recent hospitalization.  Had to be rehospitalized.  Was initially started on vancomycin and cefepime.  Vancomycin was discontinued.  Currently remains on cefepime.  Patient remains fatigued and has not been improving much.  Continues to have a cough.  Continue current medications.  Palliative medicine consulted.  Continue oxygen.  Wean as tolerated.  Acute on chronic systolic CHF Ejection fraction noted to be about 35%.  Patient on low-dose IV Lasix.  Holding ACE inhibitor.  Continue to monitor strict ins and outs.  Daily weights.  He is diuresing.  Hypokalemia This will be repleted.  Acute hypoxic respiratory failure Most likely due to pneumonia and possible CHF.  Management as outlined above.  Acute encephalopathy/delirium This has been intermittent per nursing staff.  Continue to monitor for now.  Seems to be appropriate this  morning.  Asymptomatic leukocyturia Unclear significance.  History of COPD Appears to be stable.  Continue home medications.  He is noted to be on prednisone chronically.  Unclear if he takes it for COPD.  History of diabetes mellitus type 2 Holding metformin.  SSI.  Chronic atrial fibrillation Currently rate controlled.  Not on anticoagulation  Hypothyroidism Continue levothyroxine.  Normocytic anemia Likely anemia of chronic disease.  Continue to monitor for now.  No evidence for overt bleeding.  DVT Prophylaxis: Lovenox    Code Status: DNR Family Communication: No family at bedside Disposition Plan: Management as outlined above.  Await palliative medicine consultation.    LOS: 5 days   Osvaldo ShipperGokul Kassaundra Hair  [facility] Pager [phone] 07/05/2017, 1:16 PM  If 7PM-7AM, please contact night-coverage at [email or web address], password [email or web address]

## 2017-07-05 NOTE — Clinical Social Work Note (Addendum)
CSW following for assistance with disposition as pt is admitted from facility- [facility]. [facility] and [facility] to [facility]F at [place]. Spoke with pt's daughter today- no major changes in psychosocial situation since that admission. Daughter reports at baseline pt needs assistance with dressing and bathing, ambulates independently with a walker and can eat independently. Does get confused at times.  [facility] She states pt was scheduled to begin getting PT in house but did not start due to readmission to hospital. Reports she will follow course of hospitalization and can assist with arranging HH if needed at DC. Will update [facility] if another level of care is needed at DC. Daughter reports [facility] there are changes in level of care needed. Will follow. See below for complete assessment 06/24/17.  Ilean SkillMeghan Jamoni Hewes, MSW, LCSW Clinical Social Work 07/05/2017 [phone]    Clinical Social Work Assessment  Patient Details  Name: Carlos LevyClayton Morton MRN: [id number] Date of Birth: 20-Oct-1916  Date of referral:                  Reason for consult:                   Permission sought to share information with:    Permission granted to share information::     Name::        Agency::     Relationship::     Contact Information:     Housing/Transportation Living arrangements for the past 2 months:    Source of Information:    Patient Interpreter Needed:    Criminal Activity/Legal Involvement Pertinent to Current Situation/Hospitalization:    Significant Relationships:    Lives with:    Do you feel safe going back to the place where you live?    Need for family participation in patient care:     Care giving concerns:  [facility]. Per patient's ALF, patient required assistance with bathing and dressing prior to hospitalization. Staff reported  that patient used a walker for assistance with ambulation. PT recommended HHPT.    Social Worker assessment / plan:  CSW spoke with patient's daughter regarding discharge planning. Patient's daughter reported that [facility] and that the plan is for patient to return. Patient's daughter reported that patient will need PTAR for transport.  CSW contact [facility] and spoke with staff member Rosey Batheresa. Staff reported that patient can return at discharge.  CSW will complete patient's discharge and continue to follow and assist with discharge planning.  Employment status:    Insurance information:    PT Recommendations:    Information / Referral to community resources:     Patient/Family's Response to care:  Patient's daughter agreeable to patient [facility]. Patient's daughter appreciative of CSW assistance with discharge planning.  Patient/Family's Understanding of and Emotional Response to Diagnosis, Current Treatment, and Prognosis:  Patient oriented x person and unable to participate in assessment. Patient's daughter involved in patient's care and verbalized plan for patient to discharge back to current ALF.    Emotional Assessment Appearance:    Attitude/Demeanor/Rapport:    Affect (typically observed):    Orientation:    Alcohol / Substance use:    Psych involvement (Current and /or in the community):     Discharge Needs  Concerns to  be addressed:    Readmission within the last 30 days:    Current discharge risk:    Barriers to Discharge:     Antionette PolesKimberly L Long, LCSW 06/24/2017, 1:59 PM

## 2017-07-05 NOTE — Progress Notes (Signed)
CPT/flutter held as pt is asleep at this time.

## 2017-07-05 NOTE — Consult Note (Signed)
Consultation Note Date: 07/05/2017   Patient Name: Carlos Morton  DOB: 08-Sep-1916  MRN: [id number]  Age / Sex: 83 y.o., male  PCP: Guadlupe Spanish, MD Referring Physician: Bonnielee Haff, MD  Reason for Consultation: Establishing goals of care  HPI/Patient Profile: 83 y.o. male  with past medical history of chronic foley, DM, COPD, CHF (EF 35%) admitted on 06/30/2017 with unresolved HCAP (CT showing bilateral consolidation in lungs). Recently discharged on 12/21 for treatment of same. During admission has shown some signs of failure to thrive. Palliative medicine consulted for [place].  Clinical Assessment and Goals of Care: Evaluated patient, reviewed chart and met with pt daughter and son in law in person, granddaughter and son via conference call. Patient ambulating in hallway with PT upon my arrival. Talking animatedly with family. States he feels "sexy". Family notes he is eating well- enjoying homemade guacamole and chips they have brought him. He is disappointed that he cannot have chicken quesadilla listed on room service menu for dinner due to heart healthy diet orders. Reports SOB is better, but still has cough that is bothersome.  Patient from [facility] where he was ambulating with walker prior to admission. Requires assistance with medications, dressing and bathing. Discussed patient status and GOC. Family feels patient is improving now that he is ambulating and eating. They are hopeful for him to be d/c'd back to [facility] with some home PT. They do not think aggressive rehab services would provide benefit or improve his quality of life. If he were to have recurrence of infections they are uncertain if would treat. He was in this similar situation approx 4 years ago and family considered Hospice. They note he was much worse at that time and recovered. We discussed he may be in a different  place now- a few years older, worsened heart and lung function. We did discuss possibility of recurrent pneumonia, or continued decline during this hospitalization with worsening of comordities possilbe. While they are hopeful for improvement, they would not want medical interventions at the cost of his comfort. However, they state that patient enjoys hospitalizations and the attention he receives. If his pneumonia were to worsen or to prove to be untreatable they would consider comfort care options. We discussed patient's recent hospitalizations and risk for worsening deconditioining with each hospitalization. We discussed overall illness trajectory and EOL expectations. Pt is DNR.   Primary Decision Maker NEXT OF KIN - pt daughter and son    SUMMARY OF RECOMMENDATIONS -Patient is eating and ambulating- SLP eval completed on 12/17 did not show signs of aspiration which would have risk for recurrence -DNR -Continue current level of care -GOC- hopeful for return to [facility] with PT, recommend outpatient Palliative to follow for continued GOC  -PMT will continue to follow for progress/decline -F/U meeting planned with patient's daughter and son (who is arriving in town tomorrow evening) for 1/2 at [place] for continued [place]- will review MOST form -Tessalon pearls for cough   Code Status/Advance Care Planning:  DNR  Palliative Prophylaxis:   Delirium Protocol  Psycho-social/Spiritual:   Desire for further Chaplaincy support:no  Prognosis:    Unable to determine  Discharge Planning: Home with Palliative Services  Primary Diagnoses: Present on Admission: . HCAP (healthcare-associated pneumonia) . Acute lower UTI . Atrial fibrillation with controlled ventricular response ([place]) . Chronic systolic CHF (congestive heart failure) ([place]) . CKD (chronic kidney disease) stage 3, GFR 30-59 ml/min (HCC) . COPD (chronic obstructive pulmonary disease) ([place])   I have reviewed the medical  record, interviewed the patient and family, and examined the patient. The following aspects are pertinent.  Past Medical History:  Diagnosis Date  . Atrial fibrillation ([place])   . CHF (congestive heart failure) ([place])   . Diabetes mellitus   . Emphysema   . High cholesterol   . Hypertension   . Thyroid disease    Social History   Socioeconomic History  . Marital status: Single    Spouse name: None  . Number of children: None  . Years of education: None  . Highest education level: None  Social Needs  . Financial resource strain: None  . Food insecurity - worry: None  . Food insecurity - inability: None  . Transportation needs - medical: None  . Transportation needs - non-medical: None  Occupational History  . None  Tobacco Use  . Smoking status: Former Smoker    Packs/day: 3.00    Types: Cigarettes  . Smokeless tobacco: Never Used  Substance and Sexual Activity  . Alcohol use: No  . Drug use: No  . Sexual activity: No  Other Topics Concern  . None  Social History Narrative  . None   History reviewed. No pertinent family history. Scheduled Meds: . allopurinol  200 mg Oral Daily  . aspirin EC  81 mg Oral Daily  . enoxaparin (LOVENOX) injection  40 mg Subcutaneous Q24H  . ferrous sulfate  325 mg Oral Q breakfast  . finasteride  5 mg Oral Daily  . furosemide  20 mg Intravenous Daily  . gabapentin  100 mg Oral QHS  . insulin aspart  0-15 Units Subcutaneous TID WC  . levothyroxine  125 mcg Oral QAC breakfast  . predniSONE  5 mg Oral Q breakfast  . senna  1 tablet Oral QHS  . traZODone  50 mg Oral QHS   Continuous Infusions: . ceFEPime (MAXIPIME) IV Stopped (07/05/17 1122)   PRN Meds:.acetaminophen, albuterol, MUSCLE RUB, polyethylene glycol, polyvinyl alcohol Medications Prior to Admission:  Prior to Admission medications   Medication Sig Start Date End Date Taking? Authorizing Provider  allopurinol (ZYLOPRIM) 100 MG tablet Take 200 mg by mouth daily.   Yes  [provider]  aspirin EC 81 MG tablet Take 81 mg by mouth daily.   Yes [provider]  celecoxib (CELEBREX) 100 MG capsule Take 100 mg by mouth 2 (two) times daily.   Yes [provider]  ferrous sulfate 325 (65 FE) MG EC tablet Take 325 mg by mouth daily with breakfast.   Yes [provider]  finasteride (PROSCAR) 5 MG tablet Take 1 tablet (5 mg total) by mouth daily. 01/05/14  Yes Kelvin Cellar, MD  furosemide (LASIX) 20 MG tablet Take 1 tablet (20 mg total) by mouth 2 (two) times daily. 01/05/14  Yes Kelvin Cellar, MD  gabapentin (NEURONTIN) 100 MG capsule Take 100 mg by mouth at bedtime.   Yes [provider]  glipiZIDE (GLUCOTROL XL) 10 MG 24 hr tablet Take 10 mg by mouth daily  with breakfast.   Yes [provider]  levothyroxine (SYNTHROID, LEVOTHROID) 125 MCG tablet Take 125 mcg by mouth daily before breakfast.   Yes [provider]  metFORMIN (GLUCOPHAGE) 1000 MG tablet Take 1,000 mg by mouth 2 (two) times daily with a meal.   Yes [provider]  potassium chloride SA (K-DUR,KLOR-CON) 20 MEQ tablet Take 20 mEq by mouth daily.   Yes [provider]  predniSONE (DELTASONE) 5 MG tablet Take 5 mg by mouth daily with breakfast.   Yes [provider]  senna (SENOKOT) 8.6 MG TABS tablet Take 1 tablet by mouth at bedtime.    Yes [provider]  traZODone (DESYREL) 50 MG tablet Take 50 mg by mouth at bedtime.   Yes [provider]  acetaminophen (TYLENOL) 325 MG tablet Take 2 tablets (650 mg total) by mouth every 6 (six) hours as needed for mild pain, moderate pain, fever or headache. Patient taking differently: Take 650 mg by mouth every 6 (six) hours as needed for moderate pain.  01/05/14   Kelvin Cellar, MD  albuterol (PROVENTIL) (2.5 MG/3ML) 0.083% nebulizer solution Take 2.5 mg by nebulization every 6 (six) hours as needed for wheezing or shortness of breath.    [provider]  Camphor (JOINTFLEX) 3.1 % CREA Apply 1 application topically 2 (two) times daily as needed (pain).    [provider]  Polyethyl Glycol-Propyl Glycol (SYSTANE OP) Apply 1-2 drops to eye daily as needed (dry eyes).    [provider]  polyethylene glycol (MIRALAX / GLYCOLAX) packet Take 17 g by mouth 2 (two) times daily as needed for mild constipation.    [provider]   Allergies  Allergen Reactions  . Codeine Other (See Comments)    unknown  . Demerol [Meperidine] Other (See Comments)     Unknown per pt  mar  . Diltiazem Other (See Comments)     Unknown per pt  mar  . Penicillins Other (See Comments)     Unknown per pt  mar  . Sulfa Drugs Cross Reactors Hives    Per [facility]   Review of Systems  Constitutional: Positive for fatigue.  Respiratory: Positive for cough.     Physical Exam  Constitutional: He is oriented to person, place, and time.  HENT:  Head: Normocephalic and atraumatic.  Neurological: He is alert and oriented to person, place, and time.  Skin: Skin is warm and dry. There is pallor.  Psychiatric: He has a normal mood and affect. His behavior is normal.  Nursing note and vitals reviewed.   Vital Signs: BP 103/76 (BP Location: Right Arm)   Pulse 71   Temp 98.2 F (36.8 C) (Oral)   Resp 18   Ht _0  (1.727 m)   Wt 66.2 kg (146 lb)   SpO2 95%   BMI 22.20 kg/m  Pain Assessment: No/denies pain   Pain Score: 0-No pain   SpO2: SpO2: 95 % O2 Device:SpO2: 95 % O2 Flow Rate: .O2 Flow Rate (L/min): 2 L/min  IO: Intake/output summary:   Intake/Output Summary (Last 24 hours) at 07/05/2017 1558 Last data filed at 07/05/2017 1450 Gross per 24 hour  Intake 970 ml  Output 2750 ml  Net -1780 ml    LBM: Last BM Date: 07/03/17 Baseline Weight: Weight: 66.2 kg (146 lb) Most recent weight: Weight: 66.2 kg (146 lb)     Palliative Assessment/Data: PPS: 50%     Thank you for this consult. Palliative  medicine will continue to follow and assist as needed.   Time In: 1500 Time Out: 1630 Time Total: 90 minutes Greater than 50%  of this time was spent counseling and coordinating care related to the above assessment and plan.  Signed by: Mariana Kaufman, AGNP-C Palliative Medicine    Please contact Palliative Medicine Team phone at [phone] for questions and concerns.  For individual provider: See Shea Evans

## 2017-07-06 LAB — CBC
HCT: 26 % — ABNORMAL LOW (ref 39.0–52.0)
Hemoglobin: 8.3 g/dL — ABNORMAL LOW (ref 13.0–17.0)
MCH: 29.5 pg (ref 26.0–34.0)
MCHC: 31.9 g/dL (ref 30.0–36.0)
MCV: 92.5 fL (ref 78.0–100.0)
Platelets: 186 10*3/uL (ref 150–400)
RBC: 2.81 MIL/uL — AB (ref 4.22–5.81)
RDW: 14.3 % (ref 11.5–15.5)
WBC: 11.7 10*3/uL — ABNORMAL HIGH (ref 4.0–10.5)

## 2017-07-06 LAB — GLUCOSE, CAPILLARY
GLUCOSE-CAPILLARY: 116 mg/dL — AB (ref 65–99)
GLUCOSE-CAPILLARY: 72 mg/dL (ref 65–99)
Glucose-Capillary: 274 mg/dL — ABNORMAL HIGH (ref 65–99)

## 2017-07-06 LAB — BASIC METABOLIC PANEL
Anion gap: 6 (ref 5–15)
BUN: 18 mg/dL (ref 6–20)
CO2: 29 mmol/L (ref 22–32)
CREATININE: 1.2 mg/dL (ref 0.61–1.24)
Calcium: 8.3 mg/dL — ABNORMAL LOW (ref 8.9–10.3)
Chloride: 101 mmol/L (ref 101–111)
GFR calc Af Amer: 56 mL/min — ABNORMAL LOW (ref >60)
GFR, EST NON AFRICAN AMERICAN: 48 mL/min — AB (ref >60)
Glucose, Bld: 140 mg/dL — ABNORMAL HIGH (ref 65–99)
Potassium: 4 mmol/L (ref 3.5–5.1)
SODIUM: 136 mmol/L (ref 135–145)

## 2017-07-06 MED ORDER — LEVOFLOXACIN 500 MG PO TABS
500.0000 mg | ORAL_TABLET | Freq: Every day | ORAL | Status: DC
  Administered 2017-07-06: 500 mg via ORAL
  Filled 2017-07-06: qty 1

## 2017-07-06 MED ORDER — FUROSEMIDE 20 MG PO TABS
20.0000 mg | ORAL_TABLET | Freq: Every day | ORAL | Status: DC
  Administered 2017-07-06 – 2017-07-07 (×2): 20 mg via ORAL
  Filled 2017-07-06 (×2): qty 1

## 2017-07-06 NOTE — Progress Notes (Signed)
Daily Progress Note   Patient Name: Carlos Morton       Date: 07/06/2017 DOB: 1917/06/08  Age: 84 y.o. MRN#: [id number] Attending Physician: Osvaldo Shipper, MD Primary Care Physician: Karle Plumber, MD Admit Date: 06/30/2017  Reason for Consultation/Follow-up: Establishing goals of care  Subjective: Patient in bed asleep. Awakens easily. Tells me he feels well. Is looking forward to the lunch he ordered, hopes to go home tomorrow. Oriented somewhat- tells me he is in a hospital in [place], unsure to time, able to tell me he has pneumonia. Discussed life review. His wife died 8 years ago. They enjoyed jitterbugging. 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 He is hopeful to return to [employer]. Says if he needed to return to the hospital he would not mind that. Music therapy provided- he reminisced during playing of Walker Shadow.  When asked about his thoughts regarding EOL wishes, he says he has done some preparation- has given away all his money, but disengages from other discussion regarding EOL.   Review of Systems  Constitutional: Positive for malaise/fatigue.  Respiratory: Positive for cough.   Psychiatric/Behavioral: Negative for depression and suicidal ideas.    Length of Stay: 6  Current Medications: Scheduled Meds:  . allopurinol  200 mg Oral Daily  . aspirin EC  81 mg Oral Daily  . enoxaparin (LOVENOX) injection  40 mg Subcutaneous Q24H  . ferrous sulfate  325 mg Oral Q breakfast  . finasteride  5 mg Oral Daily  . furosemide  20 mg Intravenous Daily  . gabapentin  100 mg Oral QHS  . insulin aspart  0-15 Units Subcutaneous TID WC  .  levofloxacin  500 mg Oral QHS  . levothyroxine  125 mcg Oral QAC breakfast  . predniSONE  5 mg Oral Q breakfast  . senna  1 tablet Oral QHS  . traZODone  50 mg Oral QHS    Continuous Infusions:   PRN Meds: acetaminophen, albuterol, benzonatate, MUSCLE RUB, polyethylene glycol, polyvinyl alcohol  Physical Exam  Constitutional: He appears well-developed and well-nourished.  Cardiovascular: Normal rate and regular rhythm.  Pulmonary/Chest: Effort normal.  Neurological: He is alert.  Skin: There is pallor.  Nursing note and vitals reviewed.           Vital Signs: BP 119/64 (BP Location: Right Arm)   Pulse 61   Temp 98.6 F (37 C) (Oral)   Resp 16   Ht 5\' 8"  (1.727 m)   Wt 66.2 kg (146 lb)   SpO2 92%   BMI 22.20 kg/m  SpO2: SpO2: 92 % O2 Device: O2 Device: Nasal Cannula O2 Flow Rate: O2 Flow Rate (L/min): 2 L/min  Intake/output summary:   Intake/Output Summary (Last 24 hours) at 07/06/2017 1136 Last data filed at 07/06/2017 0416 Gross per 24 hour  Intake 360 ml  Output 750 ml  Net -390 ml   LBM: Last BM Date: 07/03/17 Baseline Weight: Weight: 66.2 kg (146 lb) Most recent weight: Weight: 66.2 kg (146 lb)  Palliative Assessment/Data: PPS: 50 %     Patient Active Problem List   Diagnosis Date Noted  . Advance care planning   . Goals of care, counseling/discussion   . Palliative care by specialist   . Pressure injury of skin 07/01/2017  . HCAP (healthcare-associated pneumonia) 06/30/2017  . CKD (chronic kidney disease) stage 3, GFR 30-59 ml/min (HCC) 06/30/2017  . AKI (acute kidney injury) (HCC)   . PNA (pneumonia) 06/22/2017  . Acute lower UTI 06/22/2017  . Leukocytosis 06/22/2017  . Cough 06/22/2017  . Chronic systolic CHF (congestive heart failure) (HCC) 06/22/2017  . COPD (chronic obstructive pulmonary disease) (HCC) 06/22/2017  . On home oxygen therapy 06/22/2017  . DNR (do not resuscitate) 06/22/2017  . Palliative care encounter 01/03/2014  . SIRS  (systemic inflammatory response syndrome) (HCC) 12/29/2013  . CHF (congestive heart failure) (HCC) 06/21/2013  . Bradycardia 10/27/2011  . DM (diabetes mellitus), type 2 (HCC) 10/22/2011  . CHF exacerbation (HCC) 10/22/2011  . CAD (coronary artery disease), native coronary artery 10/22/2011  . COPD bronchitis 10/22/2011  . Hypothyroidism (acquired) 10/22/2011  . Atrial fibrillation with controlled ventricular response (HCC) 10/22/2011    Palliative Care Assessment & Plan   Patient Profile: 84 y.o. male  with past medical history of chronic foley, DM, COPD, CHF (EF 35%) admitted on 06/30/2017 with unresolved HCAP (CT showing bilateral consolidation in lungs). Recently discharged on 12/21 for treatment of same. During admission has shown some signs of failure to thrive. Palliative medicine consulted for GOC.  Assessment/Recommendations/Plan   Continue current level of care- recommend continue progressing activity- encouraging out of bed to chair  Food is very important to patient QOL experience- recommend liberalizing diet order to broaden food choices  F/U meeting planned with family tomorrow  Goals of Care and Additional Recommendations:  Limitations on Scope of Treatment: Full Scope Treatment  Code Status:  DNR  Prognosis:   Unable to determine  Discharge Planning:  Home with Home Health  Thank you for allowing the Palliative Medicine Team to assist in the care of this patient.   Time In: 1115 Time Out: 1150 Total Time 35 mins Prolonged Time Billed no      Greater than 50%  of this time was spent counseling and coordinating care related to the above assessment and plan.  Ocie BobKasie Mahan, AGNP-C Palliative Medicine   Please contact Palliative Medicine Team phone at [phone] for questions and concerns.

## 2017-07-06 NOTE — Progress Notes (Signed)
Pharmacy Antibiotic Note  Denny LevyClayton Kotlyar is a 84 y.o. male admitted on 06/30/2017 with pneumonia.  Pharmacy has been consulted for levaquin dosing. 07/06/2017  ABX D#7. Cefepime>>Levaquin. WBC stable at 11.7 (on prednisone).  AF.   Plan: levaquin 500 mg po qhs Pharmacy to sign off  Height: 5\' 8"  (172.7 cm) Weight: 146 lb (66.2 kg) IBW/kg (Calculated) : 68.4  Temp (24hrs), Avg:98.4 F (36.9 C), Min:98.2 F (36.8 C), Max:98.6 F (37 C)  Recent Labs  Lab 06/30/17 1631 06/30/17 2022 06/30/17 2249 07/01/17 0344 07/03/17 0428 07/04/17 0312 07/05/17 0416 07/06/17 0402  WBC 13.8*  --   --   --  12.5* 11.6* 12.4* 11.7*  CREATININE 1.31*  --   --  1.22 1.12 1.15 1.24 1.20  LATICACIDVEN  --  3.47* 0.81  --   --   --   --   --     Estimated Creatinine Clearance: 32.2 mL/min (by C-G formula based on SCr of 1.2 mg/dL).    Allergies  Allergen Reactions  . Codeine Other (See Comments)    unknown  . Demerol [Meperidine] Other (See Comments)     Unknown per pt  mar  . Diltiazem Other (See Comments)     Unknown per pt  mar  . Penicillins Other (See Comments)     Unknown per pt  mar  . Sulfa Drugs Cross Reactors Hives    [facility]  Antimicrobials this admission: 12/26 vancomycin >> 12/29 12/26 cefepime >> 1/1 1/1 lvq>>  Dose adjustments this admission:  Microbiology results: 12/26 BCx: ngF 12/26 UCx: multiple species 12/27 Respiratory: NF-final 12/28 MRSA PCR: neg 12/26 HIV neg 12/26 strep pneumo neg   Thank you for allowing pharmacy to be a part of this patient's care. Herby AbrahamMichelle T. Apurva Reily, Pharm.D. [phone] 07/06/2017 9:17 AM

## 2017-07-06 NOTE — Progress Notes (Signed)
[facility] PROGRESS NOTE  Carlos Morton ZOX:[id number] DOB: 1916-12-15 DOA: 06/30/2017  PCP: Karle Plumber, MD  Brief History/Interval Summary: 84 year old Caucasian male with a past medical history of atrial fibrillation not on anticoagulation, diabetes, COPD, chronic systolic CHF with ejection fraction of 35% who was recently hospitalized for pneumonia who presented again with a persistent cough and was found to have consolidation in the lung.  He was hospitalized for further management.  Patient was thought to have failure to thrive.  Considering his other comorbidities and persistent pneumonia palliative medicine was consulted.  Reason for Visit: Persistent pneumonia  Consultants: Palliative medicine  Procedures: None  Antibiotics: Initially on vancomycin.  Stopped on 12/29 Currently on cefepime started on 12/27 Changed over to Levaquin starting 1/1  Subjective/Interval History: Patient states that he is feeling better.  Denies any complaints this morning.  States that cough has improved.  Denies any pain.  Denies any difficulty breathing this morning.    ROS: Denies any nausea or vomiting  Objective:  Vital Signs  Vitals:   07/05/17 0505 07/05/17 1231 07/06/17 0416 07/06/17 0842  BP: 130/86 103/76 119/64   Pulse: 69 71 61   Resp: 18 18 16    Temp: 98 F (36.7 C) 98.2 F (36.8 C) 98.6 F (37 C)   TempSrc: Oral Oral Oral   SpO2: 93% 95% 96% 92%  Weight:      Height:        Intake/Output Summary (Last 24 hours) at 07/06/2017 0900 Last data filed at 07/06/2017 0416 Gross per 24 hour  Intake 410 ml  Output 1600 ml  Net -1190 ml   Filed Weights   06/30/17 1550  Weight: 66.2 kg (146 lb)    General appearance: He is awake alert.  In no distress Resp: Air entry is improving.  No wheezing.  Few crackles at the bases. Cardio: S1-S2 is normal regular.  No S3-S4.  No rubs murmurs of bruit GI: Abdomen is soft.  Nontender nondistended.  No masses organomegaly.    Extremities: No edema Neurologic: No focal neurological deficit  Lab Results:  Data Reviewed: I have personally reviewed following labs and imaging studies  CBC: Recent Labs  Lab 06/30/17 1631 07/03/17 0428 07/04/17 0312 07/05/17 0416 07/06/17 0402  WBC 13.8* 12.5* 11.6* 12.4* 11.7*  NEUTROABS 11.6*  --   --   --   --   HGB 9.4* 8.5* 7.9* 8.5* 8.3*  HCT 29.0* 26.4* 25.0* 26.6* 26.0*  MCV 92.4 93.6 92.9 93.0 92.5  PLT 252 203 185 199 186    Basic Metabolic Panel: Recent Labs  Lab 07/01/17 0344 07/03/17 0428 07/04/17 0312 07/05/17 0416 07/06/17 0402  NA 136 137 138 137 136  K 3.5 3.6 3.5 3.3* 4.0  CL 103 104 106 102 101  CO2 26 26 27 27 29   GLUCOSE 93 92 110* 87 140*  BUN 22* 18 17 15 18   CREATININE 1.22 1.12 1.15 1.24 1.20  CALCIUM 8.1* 8.4* 8.3* 8.2* 8.3*    GFR: Estimated Creatinine Clearance: 32.2 mL/min (by C-G formula based on SCr of 1.2 mg/dL).  CBG: Recent Labs  Lab 07/05/17 0745 07/05/17 1200 07/05/17 1646 07/05/17 2110 07/06/17 0804  GLUCAP 120* 220* 186* 158* 116*     Recent Results (from the past 240 hour(s))  Culture, blood (Routine X 2) w Reflex to ID Panel     Status: None   Collection Time: 06/30/17  8:01 PM  Result Value Ref Range Status   Specimen  Description BLOOD RIGHT ANTECUBITAL  Final   Special Requests   Final    BOTTLES DRAWN AEROBIC AND ANAEROBIC Blood Culture adequate volume   Culture   Final    NO GROWTH 5 DAYS Performed at [facility], [address] [place], [place] [address]    Report Status 07/05/2017 FINAL  Final  Urine Culture     Status: Abnormal   Collection Time: 06/30/17  8:09 PM  Result Value Ref Range Status   Specimen Description URINE, CLEAN CATCH  Final   Special Requests NONE  Final   Culture MULTIPLE SPECIES PRESENT, SUGGEST RECOLLECTION (A)  Final   Report Status 07/02/2017 FINAL  Final  Culture, blood (Routine X 2) w Reflex to ID Panel     Status: None   Collection Time: 06/30/17  8:13  PM  Result Value Ref Range Status   Specimen Description BLOOD LEFT ANTECUBITAL  Final   Special Requests   Final    BOTTLES DRAWN AEROBIC AND ANAEROBIC Blood Culture adequate volume   Culture   Final    NO GROWTH 5 DAYS Performed at [facility], [address], [place], [place] [address]    Report Status 07/05/2017 FINAL  Final  Culture, sputum-assessment     Status: None   Collection Time: 07/01/17  3:00 PM  Result Value Ref Range Status   Specimen Description EXPECTORATED SPUTUM  Final   Special Requests NONE  Final   Sputum evaluation THIS SPECIMEN IS ACCEPTABLE FOR SPUTUM CULTURE  Final   Report Status 07/01/2017 FINAL  Final  Culture, respiratory (NON-Expectorated)     Status: None   Collection Time: 07/01/17  3:00 PM  Result Value Ref Range Status   Specimen Description EXPECTORATED SPUTUM  Final   Special Requests NONE Reflexed from [id number]  Final   Gram Stain   Final    MODERATE WBC PRESENT, PREDOMINANTLY PMN RARE BUDDING YEAST SEEN RARE GRAM POSITIVE COCCI IN PAIRS NO SQUAMOUS EPITHELIAL CELLS SEEN    Culture   Final    Consistent with normal respiratory flora. Performed at [facility], [address], [place], [place] [address]    Report Status 07/04/2017 FINAL  Final  MRSA PCR Screening     Status: None   Collection Time: 07/02/17  4:45 PM  Result Value Ref Range Status   MRSA by PCR NEGATIVE NEGATIVE Final    Comment:        The GeneXpert MRSA Assay (FDA approved for NASAL specimens only), is one component of a comprehensive MRSA colonization surveillance program. It is not intended to diagnose MRSA infection nor to guide or monitor treatment for MRSA infections.       Radiology Studies: No results found.   Medications:  Scheduled: . allopurinol  200 mg Oral Daily  . aspirin EC  81 mg Oral Daily  . enoxaparin (LOVENOX) injection  40 mg Subcutaneous Q24H  . ferrous sulfate  325 mg Oral Q breakfast  . finasteride  5 mg Oral Daily    . furosemide  20 mg Intravenous Daily  . gabapentin  100 mg Oral QHS  . insulin aspart  0-15 Units Subcutaneous TID WC  . levothyroxine  125 mcg Oral QAC breakfast  . predniSONE  5 mg Oral Q breakfast  . senna  1 tablet Oral QHS  . traZODone  50 mg Oral QHS   Continuous: . ceFEPime (MAXIPIME) IV 1 g (07/06/17 0848)   NFA:[id number], albuterol, benzonatate, MUSCLE  RUB, polyethylene glycol, polyvinyl alcohol  Assessment/Plan:  Principal Problem:   HCAP (healthcare-associated pneumonia) Active Problems:   DM (diabetes mellitus), type 2 (HCC)   Atrial fibrillation with controlled ventricular response (HCC)   Acute lower UTI   Chronic systolic CHF (congestive heart failure) (HCC)   COPD (chronic obstructive pulmonary disease) (HCC)   CKD (chronic kidney disease) stage 3, GFR 30-59 ml/min (HCC)   Pressure injury of skin   Advance care planning   Goals of care, counseling/discussion   Palliative care by specialist    Persistent pneumonia/HCAP Patient was discharged on oral cefpodoxime during his recent hospitalization.  Had to be rehospitalized.  Was initially started on vancomycin and cefepime.  Vancomycin was discontinued.  Has been on cefepime for the last many days.  Symptoms appear to be improving.  We will change him over to oral Levaquin today.  Cough appears to be improving.  Palliative medicine consulted.  Continue oxygen.  Wean as tolerated.  Acute on chronic systolic CHF Ejection fraction noted to be about 35%.  Patient on low-dose IV Lasix.  He is diuresing. Change to oral.  Holding ACE inhibitor.  Continue to monitor strict ins and outs.  Daily weights.   Hypokalemia Repleted  Acute hypoxic respiratory failure Most likely due to pneumonia and possible CHF.  Management as outlined above.  Seems to be improving.  Acute encephalopathy/delirium This has been intermittent per nursing staff.  Continue to monitor for now.   Asymptomatic leukocyturia Unclear  significance.  History of COPD Appears to be stable.  Continue home medications.  He is noted to be on prednisone chronically.  Unclear if he takes it for COPD.  History of diabetes mellitus type 2 Holding metformin.  SSI.  Chronic atrial fibrillation Currently rate controlled.  Not on anticoagulation  Hypothyroidism Continue levothyroxine.  Normocytic anemia Likely anemia of chronic disease.  Continue to monitor for now.  No evidence for overt bleeding.  DVT Prophylaxis: Lovenox    Code Status: DNR Family Communication: No family at bedside. Discussed with his Son. Disposition Plan: Palliative medicine to meet with family tomorrow.  Seen by physical therapy.  Home health at his assisted living facility.  Possible discharge in 1-2 days.    LOS: 6 days   Osvaldo ShipperGokul Renner Sebald  [facility] Pager [phone] 07/06/2017, 9:00 AM  If 7PM-7AM, please contact night-coverage at [email or web address], password [email or web address]

## 2017-07-07 LAB — GLUCOSE, CAPILLARY
Glucose-Capillary: 162 mg/dL — ABNORMAL HIGH (ref 65–99)
Glucose-Capillary: 129 mg/dL — ABNORMAL HIGH (ref 65–99)
Glucose-Capillary: 272 mg/dL — ABNORMAL HIGH (ref 65–99)

## 2017-07-07 MED ORDER — LEVOFLOXACIN 500 MG PO TABS: 500.0000 mg | ORAL_TABLET | Freq: Every day | ORAL | 0 refills | Status: AC

## 2017-07-07 MED ORDER — GUAIFENESIN ER 600 MG PO TB12: 600.0000 mg | ORAL_TABLET | Freq: Two times a day (BID) | ORAL | Status: DC

## 2017-07-07 MED ORDER — GUAIFENESIN ER 600 MG PO TB12: 600.0000 mg | ORAL_TABLET | Freq: Two times a day (BID) | ORAL | 0 refills | Status: AC

## 2017-07-07 NOTE — [place] FL2 (Deleted)
[place] MEDICAID FL2 LEVEL OF CARE SCREENING TOOL     IDENTIFICATION  Patient Name: Carlos Morton Birthdate: Jan 21, 1917 Sex: male Admission Date (Current Location): 06/30/2017  [facility] and [place] Number:  Producer, television/film/video and Address:  [facility],  [address] [place] [address]      Provider Number: [id number]  Attending Physician Name and Address:  Osvaldo Shipper, MD  Relative Name and Phone Number:       Current Level of Care: Hospital Recommended Level of Care: Assisted Living Facility Prior Approval Number:    Date Approved/Denied:   PASRR Number:    Discharge Plan: Other (Comment)(ALF)    Current Diagnoses: Patient Active Problem List   Diagnosis Date Noted  . Advance care planning   . Goals of care, counseling/discussion   . Palliative care by specialist   . Pressure injury of skin 07/01/2017  . HCAP (healthcare-associated pneumonia) 06/30/2017  . CKD (chronic kidney disease) stage 3, GFR 30-59 ml/min (HCC) 06/30/2017  . AKI (acute kidney injury) (HCC)   . PNA (pneumonia) 06/22/2017  . Acute lower UTI 06/22/2017  . Leukocytosis 06/22/2017  . Cough 06/22/2017  . Chronic systolic CHF (congestive heart failure) (HCC) 06/22/2017  . COPD (chronic obstructive pulmonary disease) (HCC) 06/22/2017  . On home oxygen therapy 06/22/2017  . DNR (do not resuscitate) 06/22/2017  . Palliative care encounter 01/03/2014  . SIRS (systemic inflammatory response syndrome) (HCC) 12/29/2013  . CHF (congestive heart failure) (HCC) 06/21/2013  . Bradycardia 10/27/2011  . DM (diabetes mellitus), type 2 (HCC) 10/22/2011  . CHF exacerbation (HCC) 10/22/2011  . CAD (coronary artery disease), native coronary artery 10/22/2011  . COPD bronchitis 10/22/2011  . Hypothyroidism (acquired) 10/22/2011  . Atrial fibrillation with controlled ventricular response (HCC) 10/22/2011    Orientation RESPIRATION BLADDER Height & Weight     Self, Time  O2(2L)  Indwelling catheter Weight: 146 lb (66.2 kg) Height:  5\' 8"  (172.7 cm)  BEHAVIORAL SYMPTOMS/MOOD NEUROLOGICAL BOWEL NUTRITION STATUS      Continent Diet(heart healthy)  AMBULATORY STATUS COMMUNICATION OF NEEDS Skin   Limited Assist Verbally PU Stage and Appropriate Care                       Personal Care Assistance Level of Assistance  Bathing, Feeding, Dressing Bathing Assistance: Maximum assistance Feeding assistance: Independent Dressing Assistance: Maximum assistance     Functional Limitations Info  Hearing, Sight, Speech Sight Info: Adequate Hearing Info: Adequate Speech Info: Adequate    SPECIAL CARE FACTORS FREQUENCY  PT (By licensed PT), OT (By licensed OT)     PT Frequency: HHPT 3xweek OT Frequency: HHOT 3xweek            Contractures Contractures Info: Not present    Additional Factors Info  Code Status, Allergies Code Status Info: DNR Allergies Info: Codeine, Demerol Meperidine, Diltiazem, Penicillins, Sulfa Drugs Cross Reactors           Current Medications (07/07/2017):  This is the current hospital active medication list Current Facility-Administered Medications  Medication Dose Route Frequency Provider Last Rate Last Dose  . acetaminophen (TYLENOL) tablet 650 mg  650 mg Oral Q6H PRN Hillary Bow, DO      . albuterol (PROVENTIL) (2.5 MG/3ML) 0.083% nebulizer solution 2.5 mg  2.5 mg Nebulization Q6H PRN Hillary Bow, DO      . allopurinol (ZYLOPRIM) tablet 200 mg  200 mg Oral Daily Julian Reil, Jared M, DO   200 mg  at 07/07/17 1100  . aspirin EC tablet 81 mg  81 mg Oral Daily Hillary BowGardner, Jared M, DO   81 mg at 07/07/17 1100  . benzonatate (TESSALON) capsule 100 mg  100 mg Oral TID PRN Barbara CowerMahan, Kasie J, NP      . enoxaparin (LOVENOX) injection 40 mg  40 mg Subcutaneous Q24H Julian ReilGardner, Jared M, DO   40 mg at 07/07/17 1100  . ferrous sulfate tablet 325 mg  325 mg Oral Q breakfast Hillary BowGardner, Jared M, DO   325 mg at 07/07/17 0847  . finasteride (PROSCAR)  tablet 5 mg  5 mg Oral Daily Julian ReilGardner, Jared M, DO   5 mg at 07/07/17 1100  . furosemide (LASIX) tablet 20 mg  20 mg Oral Daily Osvaldo ShipperKrishnan, Gokul, MD   20 mg at 07/07/17 1100  . gabapentin (NEURONTIN) capsule 100 mg  100 mg Oral QHS Lyda PeroneGardner, Jared M, DO   100 mg at 07/06/17 2136  . guaiFENesin (MUCINEX) 12 hr tablet 600 mg  600 mg Oral BID Osvaldo ShipperKrishnan, Gokul, MD      . insulin aspart (novoLOG) injection 0-15 Units  0-15 Units Subcutaneous TID WC Hillary BowGardner, Jared M, DO   8 Units at 07/07/17 1200  . levofloxacin (LEVAQUIN) tablet 500 mg  500 mg Oral QHS Herby AbrahamBell, Michelle T, RPH   500 mg at 07/06/17 2136  . levothyroxine (SYNTHROID, LEVOTHROID) tablet 125 mcg  125 mcg Oral QAC breakfast Hillary BowGardner, Jared M, DO   125 mcg at 07/07/17 0800  . MUSCLE RUB CREA 1 application  1 application Topical BID PRN Hillary BowGardner, Jared M, DO      . polyethylene glycol (MIRALAX / GLYCOLAX) packet 17 g  17 g Oral BID PRN Hillary BowGardner, Jared M, DO      . polyvinyl alcohol (LIQUIFILM TEARS) 1.4 % ophthalmic solution   Both Eyes Daily PRN Hillary BowGardner, Jared M, DO      . predniSONE (DELTASONE) tablet 5 mg  5 mg Oral Q breakfast Lyda PeroneGardner, Jared M, DO   5 mg at 07/07/17 0850  . senna (SENOKOT) tablet 8.6 mg  1 tablet Oral QHS Lyda PeroneGardner, Jared M, DO   8.6 mg at 07/06/17 2136  . traZODone (DESYREL) tablet 50 mg  50 mg Oral QHS Lyda PeroneGardner, Jared M, DO   50 mg at 07/06/17 2136   Facility-Administered Medications Ordered in Other Encounters  Medication Dose Route Frequency Provider Last Rate Last Dose  . LORazepam (ATIVAN) injection 1 mg  1 mg Intravenous Once Jeralyn BennettZamora, Ezequiel, MD         Discharge Medications: Please see discharge summary for a list of discharge medications.  Relevant Imaging Results:  Relevant Lab Results:   Additional Information SSN   [id number]. Needs palliative care services to follow at facility  Clearance CootsNicole A Nevena Rozenberg, LCSW

## 2017-07-07 NOTE — Progress Notes (Signed)
Daily Progress Note   Patient Name: Carlos Morton       Date: 07/07/2017 DOB: [birth year]  Age: 84 y.o. MRN#: [id number] Attending Physician: Bonnielee Haff, MD Primary Care Physician: Guadlupe Spanish, MD Admit Date: 06/30/2017  Reason for Consultation/Follow-up: Establishing goals of care  Subjective: Patient sitting up, [facility]. In good spirits. Says he is hungry. Met with daughter and son. Discussed outpatient palliative followup with possible transition to Hospice if patient declined. They noted discussion with patient's attending MD that if patient's pneumonia recurred that further treatment with antibiotics would not be beneficial. We discussed connecting with palliative outpatient would allow for easy transition to Hospice and they could follow for continued [place].   Review of Systems  Respiratory: Positive for cough. Negative for shortness of breath.     Length of Stay: 7  Current Medications: Scheduled Meds:  . allopurinol  200 mg Oral Daily  . aspirin EC  81 mg Oral Daily  . enoxaparin (LOVENOX) injection  40 mg Subcutaneous Q24H  . ferrous sulfate  325 mg Oral Q breakfast  . finasteride  5 mg Oral Daily  . furosemide  20 mg Oral Daily  . gabapentin  100 mg Oral QHS  . insulin aspart  0-15 Units Subcutaneous TID WC  . levofloxacin  500 mg Oral QHS  . levothyroxine  125 mcg Oral QAC breakfast  . predniSONE  5 mg Oral Q breakfast  . senna  1 tablet Oral QHS  . traZODone  50 mg Oral QHS    Continuous Infusions:   PRN Meds: acetaminophen, albuterol, benzonatate, MUSCLE RUB, polyethylene glycol, polyvinyl alcohol  Physical Exam  Constitutional:  frail  Pulmonary/Chest: Effort normal.  Neurological: He is alert.  Skin: Skin is  warm and dry. There is pallor.  Nursing note and vitals reviewed.           Vital Signs: BP (!) 89/64 (BP Location: Right Arm)   Pulse 71   Temp 98.4 F (36.9 C) (Oral)   Resp 16   Ht _0  (1.727 m)   Wt 66.2 kg (146 lb)   SpO2 98%   BMI 22.20 kg/m  SpO2: SpO2: 98 % O2 Device: O2 Device: Nasal Cannula O2 Flow Rate: O2 Flow Rate (L/min): 2 L/min  Intake/output summary:  Intake/Output Summary (Last 24 hours) at 07/07/2017 1150 Last data filed at 07/07/2017 1048 Gross per 24 hour  Intake 720 ml  Output 1200 ml  Net -480 ml   LBM: Last BM Date: 07/05/17 Baseline Weight: Weight: 66.2 kg (146 lb) Most recent weight: Weight: 66.2 kg (146 lb)       Palliative Assessment/Data: PPS: 50%    Flowsheet Rows     Most Recent Value  Intake Tab  Referral Department  Hospitalist  Unit at Time of Referral  Med/Surg Unit  Palliative Care Primary Diagnosis  Cardiac  Date Notified  07/04/18  Palliative Care Type  Return patient Palliative Care  Reason for referral  Clarify Goals of Care  Date of Admission  06/30/18  Date first seen by Palliative Care  07/05/18  # of days Palliative referral response time  1 Day(s)  # of days IP prior to Palliative referral  4  Clinical Assessment  Psychosocial & Spiritual Assessment  Palliative Care Outcomes      Patient Active Problem List   Diagnosis Date Noted  . Advance care planning   . Goals of care, counseling/discussion   . Palliative care by specialist   . Pressure injury of skin 07/01/2017  . HCAP (healthcare-associated pneumonia) 06/30/2017  . CKD (chronic kidney disease) stage 3, GFR 30-59 ml/min (HCC) 06/30/2017  . AKI (acute kidney injury) ([place])   . PNA (pneumonia) 06/22/2017  . Acute lower UTI 06/22/2017  . Leukocytosis 06/22/2017  . Cough 06/22/2017  . Chronic systolic CHF (congestive heart failure) ([place]) 06/22/2017  . COPD (chronic obstructive pulmonary disease) ([place]) 06/22/2017  . On home oxygen therapy 06/22/2017  .  DNR (do not resuscitate) 06/22/2017  . Palliative care encounter 01/03/2014  . SIRS (systemic inflammatory response syndrome) ([place]) 12/29/2013  . CHF (congestive heart failure) ([place]) 06/21/2013  . Bradycardia 10/27/2011  . DM (diabetes mellitus), type 2 ([place]) 10/22/2011  . CHF exacerbation ([place]) 10/22/2011  . CAD (coronary artery disease), native coronary artery 10/22/2011  . COPD bronchitis 10/22/2011  . Hypothyroidism (acquired) 10/22/2011  . Atrial fibrillation with controlled ventricular response ([place]) 10/22/2011    Palliative Care Assessment & Plan   Patient Profile: 84 y.o.malewith past medical history of chronic foley, DM, COPD, CHF (EF 35%)admitted on12/26/2018with unresolved HCAP (CT showing bilateral consolidation in lungs). Recently discharged on 12/21 for treatment of same. During admission has shown some signs of failure to thrive. Palliative medicine consulted for [place].  Assessment/Recommendations/Plan   Planned d/c back to [facility] with PT and outpatient Palliative   Goals of Care and Additional Recommendations:  Limitations on Scope of Treatment: Avoid Hospitalization  Code Status:  DNR  Prognosis:   Unable to determine  Discharge Planning:  Home with Palliative Services  Care plan was discussed with patient, his daughter and son.  Thank you for allowing the Palliative Medicine Team to assist in the care of this patient.   Time In: 1030 Time Out: 1120 Total Time 50 mins Prolonged Time Billed no      Greater than 50%  of this time was spent counseling and coordinating care related to the above assessment and plan.  Mariana Kaufman, AGNP-C Palliative Medicine   Please contact Palliative Medicine Team phone at [phone] for questions and concerns.

## 2017-07-07 NOTE — Discharge Summary (Signed)
[facility]  Physician Discharge Summary   Patient ID: Carlos Morton MRN: [id number] DOB/AGE: 82/82/1918 82 y.o.  Admit date: 06/30/2017 Discharge date: 07/07/2017  PCP: Karle Plumber, MD  DISCHARGE DIAGNOSES:  Principal Problem:   HCAP (healthcare-associated pneumonia) Active Problems:   DM (diabetes mellitus), type 2 (HCC)   Atrial fibrillation with controlled ventricular response (HCC)   Acute lower UTI   Chronic systolic CHF (congestive heart failure) (HCC)   COPD (chronic obstructive pulmonary disease) (HCC)   CKD (chronic kidney disease) stage 3, GFR 30-59 ml/min (HCC)   Pressure injury of skin   Advance care planning   Goals of care, counseling/discussion   Palliative care by specialist   RECOMMENDATIONS FOR OUTPATIENT FOLLOW UP: 1. Palliative medicine to follow at the assisted living facility 2. Recommend that he be seen by his primary care provider within 1 week.   DISCHARGE CONDITION: fair  Diet recommendation: Modified carbohydrate.  May liberalize diet.  Filed Weights   06/30/17 1550  Weight: 66.2 kg (146 lb)    INITIAL HISTORY: 84 year old Caucasian male with a past medical history of atrial fibrillation not on anticoagulation, diabetes, COPD, chronic systolic CHF with ejection fraction of 35% who was recently hospitalized for pneumonia who presented again with a persistent cough and was found to have consolidation in the lung.  He was hospitalized for further management.  Patient was thought to have failure to thrive.  Considering his other comorbidities and persistent pneumonia palliative medicine was consulted.  Consultations:  Palliative medicine   HOSPITAL COURSE:    Persistent pneumonia/HCAP Patient was discharged on oral cefpodoxime during his recent hospitalization.  Had to be rehospitalized.  Was initially started on vancomycin and cefepime.  Vancomycin was discontinued. Cefepime was changed over to levofloxacin.  Symptoms have  been improving.  Although he continues to cough. Patient will be started on Mucinex.  He has home oxygen.  Discussed in detail with the patient's children this morning.  Patient appears to be medically stable for discharge back to his assisted living facility.  Home health will be ordered.  Levaquin for a few more days.  Goals of care Palliative medicine has been following.  They will have a family discussion this morning.  Suspect that palliative medicine will recommend further follow-up at his assisted living facility.  Patient is DNR.  Acute on chronic systolic CHF Ejection fraction noted to be about 35%.    He was placed on IV diuretics.  Changed to oral.  Continue to monitor volume status.  No ACE inhibitor due to borderline low blood pressures.  Hypokalemia Repleted  Acute hypoxic respiratory failure Most likely due to pneumonia and possible CHF.  Management as outlined above.  Seems to be improving.  Acute encephalopathy/delirium This has been intermittent per nursing staff.    History of COPD Appears to be stable.  Continue home medications.  He is noted to be on prednisone chronically.    History of diabetes mellitus type 2 Continue home medications.  Will not strive for too strict a glycemic control.  Chronic atrial fibrillation Currently rate controlled.  Not on anticoagulation  Hypothyroidism Continue levothyroxine.  Normocytic anemia Likely anemia of chronic disease. No evidence for overt bleeding.  Hemoglobin has been stable.  Stable.  Seems medically ready for discharge back to his assisted living facility.  Home health will be ordered.  Palliative medicine to continue to follow.    PERTINENT LABS:  The results of significant diagnostics from this hospitalization (including imaging, microbiology, ancillary  and laboratory) are listed below for reference.    Microbiology: Recent Results (from the past 240 hour(s))  Culture, blood (Routine X 2) w Reflex  to ID Panel     Status: None   Collection Time: 06/30/17  8:01 PM  Result Value Ref Range Status   Specimen Description BLOOD RIGHT ANTECUBITAL  Final   Special Requests   Final    BOTTLES DRAWN AEROBIC AND ANAEROBIC Blood Culture adequate volume   Culture   Final    NO GROWTH 5 DAYS Performed at [facility], [address] [place], [place] [address]    Report Status 07/05/2017 FINAL  Final  Urine Culture     Status: Abnormal   Collection Time: 06/30/17  8:09 PM  Result Value Ref Range Status   Specimen Description URINE, CLEAN CATCH  Final   Special Requests NONE  Final   Culture MULTIPLE SPECIES PRESENT, SUGGEST RECOLLECTION (A)  Final   Report Status 07/02/2017 FINAL  Final  Culture, blood (Routine X 2) w Reflex to ID Panel     Status: None   Collection Time: 06/30/17  8:13 PM  Result Value Ref Range Status   Specimen Description BLOOD LEFT ANTECUBITAL  Final   Special Requests   Final    BOTTLES DRAWN AEROBIC AND ANAEROBIC Blood Culture adequate volume   Culture   Final    NO GROWTH 5 DAYS Performed at [facility], [address] [place], [place] [address]    Report Status 07/05/2017 FINAL  Final  Culture, sputum-assessment     Status: None   Collection Time: 07/01/17  3:00 PM  Result Value Ref Range Status   Specimen Description EXPECTORATED SPUTUM  Final   Special Requests NONE  Final   Sputum evaluation THIS SPECIMEN IS ACCEPTABLE FOR SPUTUM CULTURE  Final   Report Status 07/01/2017 FINAL  Final  Culture, respiratory (NON-Expectorated)     Status: None   Collection Time: 07/01/17  3:00 PM  Result Value Ref Range Status   Specimen Description EXPECTORATED SPUTUM  Final   Special Requests NONE Reflexed from [id number]  Final   Gram Stain   Final    MODERATE WBC PRESENT, PREDOMINANTLY PMN RARE BUDDING YEAST SEEN RARE GRAM POSITIVE COCCI IN PAIRS NO SQUAMOUS EPITHELIAL CELLS SEEN    Culture   Final    Consistent with normal respiratory flora. Performed  at [facility], [address] [place], [place] [address]    Report Status 07/04/2017 FINAL  Final  MRSA PCR Screening     Status: None   Collection Time: 07/02/17  4:45 PM  Result Value Ref Range Status   MRSA by PCR NEGATIVE NEGATIVE Final    Comment:        The GeneXpert MRSA Assay (FDA approved for NASAL specimens only), is one component of a comprehensive MRSA colonization surveillance program. It is not intended to diagnose MRSA infection nor to guide or monitor treatment for MRSA infections.      Labs: Basic Metabolic Panel: Recent Labs  Lab 07/01/17 0344 07/03/17 0428 07/04/17 0312 07/05/17 0416 07/06/17 0402  NA 136 137 138 137 136  K 3.5 3.6 3.5 3.3* 4.0  CL 103 104 106 102 101  CO2 26 26 27 27 29   GLUCOSE 93 92 110* 87 140*  BUN 22* 18 17 15 18   CREATININE 1.22 1.12 1.15 1.24 1.20  CALCIUM 8.1* 8.4* 8.3* 8.2* 8.3*   CBC: Recent Labs  Lab  06/30/17 1631 07/03/17 0428 07/04/17 0312 07/05/17 0416 07/06/17 0402  WBC 13.8* 12.5* 11.6* 12.4* 11.7*  NEUTROABS 11.6*  --   --   --   --   HGB 9.4* 8.5* 7.9* 8.5* 8.3*  HCT 29.0* 26.4* 25.0* 26.6* 26.0*  MCV 92.4 93.6 92.9 93.0 92.5  PLT 252 203 185 199 186   BNP: BNP (last 3 results) Recent Labs    06/30/17 1631  BNP 1,088.6*    CBG: Recent Labs  Lab 07/06/17 1140 07/06/17 1714 07/06/17 2116 07/07/17 0746 07/07/17 1143  GLUCAP 274* 72 162* 129* 272*     IMAGING STUDIES Dg Chest 2 View  Result Date: 06/30/2017 CLINICAL DATA:  Productive cough. Finish treatment for pneumonia without relief. EXAM: CHEST  2 VIEW COMPARISON:  06/21/2017 FINDINGS: Enlarged cardiac silhouette. Calcific atherosclerotic disease and tortuosity of the aorta. There is persistent patchy airspace consolidation versus pulmonary nodules in bilateral lower lobes. Relative hypoinflation of the lungs. No evidence of pneumothorax or large pleural effusion. Osseus structures are stable. IMPRESSION: Persistent patchy  airspace consolidation versus pulmonary nodules in bilateral lower lobes. Given the persistence of this abnormality malignancy cannot be excluded. Electronically Signed   By: Ted Mcalpine M.D.   On: 06/30/2017 17:11   Dg Chest 2 View  Result Date: 06/21/2017 CLINICAL DATA:  Productive cough EXAM: CHEST  2 VIEW COMPARISON:  01/01/2014 FINDINGS: Elevation of the left diaphragm. No large pleural effusion. Right greater than left lung base pulmonary infiltrates. Enlarged cardiomediastinal silhouette. No pneumothorax. IMPRESSION: 1. Right greater than left bilateral lung base infiltrates. Radiographic follow-up to resolution recommended 2. Mild cardiomegaly. Electronically Signed   By: Jasmine Pang M.D.   On: 06/21/2017 21:48   Ct Head Wo Contrast  Result Date: 06/21/2017 CLINICAL DATA:  Altered mental status EXAM: CT HEAD WITHOUT CONTRAST TECHNIQUE: Contiguous axial images were obtained from the base of the skull through the vertex without intravenous contrast. COMPARISON:  None. FINDINGS: Brain: Motion degradation. No gross acute territorial infarction, hemorrhage, or intracranial mass is visualized. Mild to moderate small vessel ischemic changes of the white matter. Moderate atrophy. Old lacunar infarct in the right white matter. Vascular: No hyperdense vessels.  Carotid artery calcification. Skull: Mastoid sclerosis and minimal fluid on the left.  No fracture Sinuses/Orbits: Moderate mucosal thickening in the maxillary, sphenoid and ethmoid sinuses. No acute orbital abnormality. Other: None IMPRESSION: 1. Motion limits the study 2. No definite CT evidence for acute intracranial abnormality. Atrophy and small vessel ischemic changes of the white matter Electronically Signed   By: Jasmine Pang M.D.   On: 06/21/2017 23:32   Ct Chest W Contrast  Result Date: 06/30/2017 CLINICAL DATA:  Pneumonia, recurrent urinary tract infection, productive cough, hypoxia, fever other EXAM: CT CHEST WITH CONTRAST  TECHNIQUE: Multidetector CT imaging of the chest was performed during intravenous contrast administration. CONTRAST:  60mL ISOVUE-300 IOPAMIDOL (ISOVUE-300) INJECTION 61% COMPARISON:  06/30/2017 chest x-ray FINDINGS: Cardiovascular: Atherosclerosis of the thoracic aorta. Two vessel arch anatomy appearing patent and tortuous. No significant aneurysm or dissection. Visualized central pulmonary arteries appear patent. Cardiomegaly noted. Native coronary atherosclerosis present. Negative for pericardial effusion. Mediastinum/Nodes: Small prominent scattered mediastinal and hilar lymph nodes, suspect reactive. Trachea and central airways appear patent. No hiatal hernia. Lungs/Pleura: Background emphysema pattern most pronounced in the upper lobes. Mild associated peripheral interstitial fibrotic pattern in the upper lobes. Dependent bilateral lower lobe as well as right upper lobe and left upper lobe patchy and nodular airspace process compatible with bilateral pneumonia.  Pneumonia is most consolidated in the right lower lobe. No associated interstitial edema. No pneumothorax. Trace left pleural effusion medially. Upper Abdomen: Abdominal atherosclerosis noted. Cholelithiasis present. Suspect left renal cysts. No acute upper abdominal process. Musculoskeletal: Degenerative changes noted spine and associated scoliosis. No chest wall soft tissue abnormality or asymmetry. Degenerative changes of the shoulders. Increased thoracic kyphosis noted on the lateral view. No acute compression fracture. Sternum intact. IMPRESSION: Patchy nodular bilateral airspace process / consolidation compatible with bilateral pneumonia. Difficult to exclude component of aspiration. Underlying chronic emphysema and mild interstitial fibrosis peripherally. Trace right pleural effusion medially Cardiomegaly without pericardial effusion or associated CHF coronary atherosclerosis Aortic Atherosclerosis (ICD10-I70.0) and Emphysema (ICD10-J43.9).  Electronically Signed   By: Judie Petit.  Shick M.D.   On: 06/30/2017 19:17    DISCHARGE EXAMINATION: Vitals:   07/06/17 0842 07/06/17 1320 07/06/17 2026 07/07/17 0402  BP:  (!) 116/49 129/82 (!) 89/64  Pulse:  94 70 71  Resp:  16 14 16   Temp:  98.2 F (36.8 C) 98.2 F (36.8 C) 98.4 F (36.9 C)  TempSrc:  Oral Oral Oral  SpO2: 92% 97% 98% 98%  Weight:      Height:       General appearance: alert, cooperative and no distress Resp: Continue to have crackles at the bases.  No wheezing.  Normal Effort at rest. Cardio: regular rate and rhythm, S1, S2 normal, no murmur, click, rub or gallop GI: soft, non-tender; bowel sounds normal; no masses,  no organomegaly  DISPOSITION: Back to assisted living facility  Discharge Instructions    Call MD for:  extreme fatigue   Complete by:  As directed    Call MD for:  persistant dizziness or light-headedness   Complete by:  As directed    Call MD for:  persistant nausea and vomiting   Complete by:  As directed    Call MD for:  severe uncontrolled pain   Complete by:  As directed    Call MD for:  temperature >100.4   Complete by:  As directed    Discharge instructions   Complete by:  As directed    Please review instructions on the discharge summary.  You were cared for by a hospitalist during your hospital stay. If you have any questions about your discharge medications or the care you received while you were in the hospital after you are discharged, you can call the unit and asked to speak with the hospitalist on call if the hospitalist that took care of you is not available. Once you are discharged, your primary care physician will handle any further medical issues. Please note that NO REFILLS for any discharge medications will be authorized once you are discharged, as it is imperative that you return to your primary care physician (or establish a relationship with a primary care physician if you do not have one) for your aftercare needs so that they  can reassess your need for medications and monitor your lab values. If you do not have a primary care physician, you can call [phone] for a physician referral.   Increase activity slowly   Complete by:  As directed         Allergies as of 07/07/2017      Reactions   Codeine Other (See Comments)   unknown   Demerol [meperidine] Other (See Comments)    Unknown per pt  mar   Diltiazem Other (See Comments)    Unknown per pt  mar   Penicillins Other (  See Comments)    Unknown per pt  mar   Sulfa Drugs Cross Reactors Hives   Per [facility]      Medication List    STOP taking these medications   glipiZIDE 10 MG 24 hr tablet Commonly known as:  GLUCOTROL XL     TAKE these medications   acetaminophen 325 MG tablet Commonly known as:  TYLENOL Take 2 tablets (650 mg total) by mouth every 6 (six) hours as needed for mild pain, moderate pain, fever or headache. What changed:  reasons to take this   albuterol (2.5 MG/3ML) 0.083% nebulizer solution Commonly known as:  PROVENTIL Take 2.5 mg by nebulization every 6 (six) hours as needed for wheezing or shortness of breath.   allopurinol 100 MG tablet Commonly known as:  ZYLOPRIM Take 200 mg by mouth daily.   aspirin EC 81 MG tablet Take 81 mg by mouth daily.   celecoxib 100 MG capsule Commonly known as:  CELEBREX Take 100 mg by mouth 2 (two) times daily.   ferrous sulfate 325 (65 FE) MG EC tablet Take 325 mg by mouth daily with breakfast.   finasteride 5 MG tablet Commonly known as:  PROSCAR Take 1 tablet (5 mg total) by mouth daily.   furosemide 20 MG tablet Commonly known as:  LASIX Take 1 tablet (20 mg total) by mouth 2 (two) times daily.   gabapentin 100 MG capsule Commonly known as:  NEURONTIN Take 100 mg by mouth at bedtime.   guaiFENesin 600 MG 12 hr tablet Commonly known as:  MUCINEX Take 1 tablet (600 mg total) by mouth 2 (two) times daily.   JOINTFLEX 3.1 % Crea Generic drug:  Camphor Apply 1  application topically 2 (two) times daily as needed (pain).   levofloxacin 500 MG tablet Commonly known as:  LEVAQUIN Take 1 tablet (500 mg total) by mouth at bedtime for 7 days.   levothyroxine 125 MCG tablet Commonly known as:  SYNTHROID, LEVOTHROID Take 125 mcg by mouth daily before breakfast.   metFORMIN 1000 MG tablet Commonly known as:  GLUCOPHAGE Take 1,000 mg by mouth 2 (two) times daily with a meal.   polyethylene glycol packet Commonly known as:  MIRALAX / GLYCOLAX Take 17 g by mouth 2 (two) times daily as needed for mild constipation.   potassium chloride SA 20 MEQ tablet Commonly known as:  K-DUR,KLOR-CON Take 20 mEq by mouth daily.   predniSONE 5 MG tablet Commonly known as:  DELTASONE Take 5 mg by mouth daily with breakfast.   senna 8.6 MG Tabs tablet Commonly known as:  SENOKOT Take 1 tablet by mouth at bedtime.   SYSTANE OP Apply 1-2 drops to eye daily as needed (dry eyes).   traZODone 50 MG tablet Commonly known as:  DESYREL Take 50 mg by mouth at bedtime.        Contact information for after-discharge care    Destination    [facility] Follow up.   Service:  Assisted Living Contact information: [phone] [address] [place] [address] [phone]              TOTAL DISCHARGE TIME: 35 mins  Osvaldo Shipper  [facility] Pager [phone]  07/07/2017, 12:35 PM

## 2017-07-07 NOTE — [place] FL2 (Signed)
[place] MEDICAID FL2 LEVEL OF CARE SCREENING TOOL     IDENTIFICATION  Patient Name: Carlos Morton Birthdate: Jul 26, 1916 Sex: male Admission Date (Current Location): 06/30/2017  [facility] and [place] Number:  Producer, television/film/video and Address:  [facility],  [address], [place] [address]      Provider Number: [id number]  Attending Physician Name and Address:  Osvaldo Shipper, MD  Relative Name and Phone Number:       Current Level of Care: Hospital Recommended Level of Care: Assisted Living Facility Prior Approval Number:    Date Approved/Denied:   PASRR Number:    Discharge Plan: Other (Comment)(ALF)    Current Diagnoses: Patient Active Problem List   Diagnosis Date Noted  . Advance care planning   . Goals of care, counseling/discussion   . Palliative care by specialist   . Pressure injury of skin 07/01/2017  . HCAP (healthcare-associated pneumonia) 06/30/2017  . CKD (chronic kidney disease) stage 3, GFR 30-59 ml/min (HCC) 06/30/2017  . AKI (acute kidney injury) (HCC)   . PNA (pneumonia) 06/22/2017  . Acute lower UTI 06/22/2017  . Leukocytosis 06/22/2017  . Cough 06/22/2017  . Chronic systolic CHF (congestive heart failure) (HCC) 06/22/2017  . COPD (chronic obstructive pulmonary disease) (HCC) 06/22/2017  . On home oxygen therapy 06/22/2017  . DNR (do not resuscitate) 06/22/2017  . Palliative care encounter 01/03/2014  . SIRS (systemic inflammatory response syndrome) (HCC) 12/29/2013  . CHF (congestive heart failure) (HCC) 06/21/2013  . Bradycardia 10/27/2011  . DM (diabetes mellitus), type 2 (HCC) 10/22/2011  . CHF exacerbation (HCC) 10/22/2011  . CAD (coronary artery disease), native coronary artery 10/22/2011  . COPD bronchitis 10/22/2011  . Hypothyroidism (acquired) 10/22/2011  . Atrial fibrillation with controlled ventricular response (HCC) 10/22/2011    Orientation RESPIRATION BLADDER Height & Weight     Self, Time  O2(2L)  Indwelling catheter Weight: 146 lb (66.2 kg) Height:  5\' 8"  (172.7 cm)  BEHAVIORAL SYMPTOMS/MOOD NEUROLOGICAL BOWEL NUTRITION STATUS      Continent Diet(heart healthy)  AMBULATORY STATUS COMMUNICATION OF NEEDS Skin   Limited Assist Verbally PU Stage and Appropriate Care    stage II pressure injury, buttocks, Foam dressing changes PRN                   Personal Care Assistance Level of Assistance  Bathing, Feeding, Dressing Bathing Assistance: Maximum assistance Feeding assistance: Independent Dressing Assistance: Maximum assistance     Functional Limitations Info  Hearing, Sight, Speech Sight Info: Adequate Hearing Info: Adequate Speech Info: Adequate    SPECIAL CARE FACTORS FREQUENCY  PT (By licensed PT), OT (By licensed OT)     PT Frequency: HHPT 3xweek OT Frequency: HHOT 3xweek            Contractures Contractures Info: Not present    Additional Factors Info  Code Status, Allergies Code Status Info: DNR Allergies Info: Codeine, Demerol Meperidine, Diltiazem, Penicillins, Sulfa Drugs Cross Reactors           Current Medications (07/07/2017):  This is the current hospital active medication list Current Facility-Administered Medications  Medication Dose Route Frequency Provider Last Rate Last Dose  . acetaminophen (TYLENOL) tablet 650 mg  650 mg Oral Q6H PRN Hillary Bow, DO      . albuterol (PROVENTIL) (2.5 MG/3ML) 0.083% nebulizer solution 2.5 mg  2.5 mg Nebulization Q6H PRN Hillary Bow, DO      . allopurinol (ZYLOPRIM) tablet 200 mg  200 mg Oral Daily  Hillary BowGardner, Jared M, DO   200 mg at 07/07/17 1100  . aspirin EC tablet 81 mg  81 mg Oral Daily Hillary BowGardner, Jared M, DO   81 mg at 07/07/17 1100  . benzonatate (TESSALON) capsule 100 mg  100 mg Oral TID PRN Barbara CowerMahan, Kasie J, NP      . enoxaparin (LOVENOX) injection 40 mg  40 mg Subcutaneous Q24H Julian ReilGardner, Jared M, DO   40 mg at 07/07/17 1100  . ferrous sulfate tablet 325 mg  325 mg Oral Q breakfast Hillary BowGardner, Jared  M, DO   325 mg at 07/07/17 0847  . finasteride (PROSCAR) tablet 5 mg  5 mg Oral Daily Julian ReilGardner, Jared M, DO   5 mg at 07/07/17 1100  . furosemide (LASIX) tablet 20 mg  20 mg Oral Daily Osvaldo ShipperKrishnan, Gokul, MD   20 mg at 07/07/17 1100  . gabapentin (NEURONTIN) capsule 100 mg  100 mg Oral QHS Lyda PeroneGardner, Jared M, DO   100 mg at 07/06/17 2136  . guaiFENesin (MUCINEX) 12 hr tablet 600 mg  600 mg Oral BID Osvaldo ShipperKrishnan, Gokul, MD      . insulin aspart (novoLOG) injection 0-15 Units  0-15 Units Subcutaneous TID WC Hillary BowGardner, Jared M, DO   8 Units at 07/07/17 1200  . levofloxacin (LEVAQUIN) tablet 500 mg  500 mg Oral QHS Herby AbrahamBell, Michelle T, RPH   500 mg at 07/06/17 2136  . levothyroxine (SYNTHROID, LEVOTHROID) tablet 125 mcg  125 mcg Oral QAC breakfast Hillary BowGardner, Jared M, DO   125 mcg at 07/07/17 0800  . MUSCLE RUB CREA 1 application  1 application Topical BID PRN Hillary BowGardner, Jared M, DO      . polyethylene glycol (MIRALAX / GLYCOLAX) packet 17 g  17 g Oral BID PRN Hillary BowGardner, Jared M, DO      . polyvinyl alcohol (LIQUIFILM TEARS) 1.4 % ophthalmic solution   Both Eyes Daily PRN Hillary BowGardner, Jared M, DO      . predniSONE (DELTASONE) tablet 5 mg  5 mg Oral Q breakfast Lyda PeroneGardner, Jared M, DO   5 mg at 07/07/17 0850  . senna (SENOKOT) tablet 8.6 mg  1 tablet Oral QHS Lyda PeroneGardner, Jared M, DO   8.6 mg at 07/06/17 2136  . traZODone (DESYREL) tablet 50 mg  50 mg Oral QHS Lyda PeroneGardner, Jared M, DO   50 mg at 07/06/17 2136   Facility-Administered Medications Ordered in Other Encounters  Medication Dose Route Frequency Provider Last Rate Last Dose  . LORazepam (ATIVAN) injection 1 mg  1 mg Intravenous Once Jeralyn BennettZamora, Ezequiel, MD         Discharge Medications: Please see discharge summary for a list of discharge medications.  Relevant Imaging Results:  Relevant Lab Results:   Additional Information SSN   [id number]. Needs palliative care services to follow at facility  Clearance CootsNicole A Jarielys Girardot, LCSW

## 2017-07-07 NOTE — Progress Notes (Signed)
[facility] [place] [facility]. Report [phone]. Spoke with staff there who are aware of need for palliative care at facility and will refer (facility uses [facility] for palliative care- CSW informed family and they are agreeable, spoke with daughter via phone and son at bedside). All information provided to facility via the HUB- including DC summary and FL2. Arranged [facility] transportation.   Ilean SkillMeghan Mari Battaglia, MSW, LCSW Clinical Social Work 07/07/2017 [phone]

## 2017-07-07 NOTE — Progress Notes (Signed)
CSW following to assist with disposition. Pt is resident of [facility] and plans to return at [place] (likely with palliative following as well as home health). Left voicemail with [facility] this morning to update them. Spoke with daughter via phone- she states she and brother are meeting with palliative again today and will advise CSW of any needs. Will continue following to assist.   Ilean SkillMeghan Bathsheba Durrett, MSW, LCSW Clinical Social Work 07/07/2017 [phone]

## 2017-07-07 NOTE — Discharge Instructions (Signed)

## 2017-08-06 DEATH — deceased

## 2018-05-31 IMAGING — CR DG CHEST 2V
2 series · 2 of 2 positions shown · non-contrast
Comparison: 06/21/2017

CLINICAL DATA: Productive cough. Finish treatment for pneumonia
without relief.

EXAM:
CHEST  2 VIEW

[w chest lat]
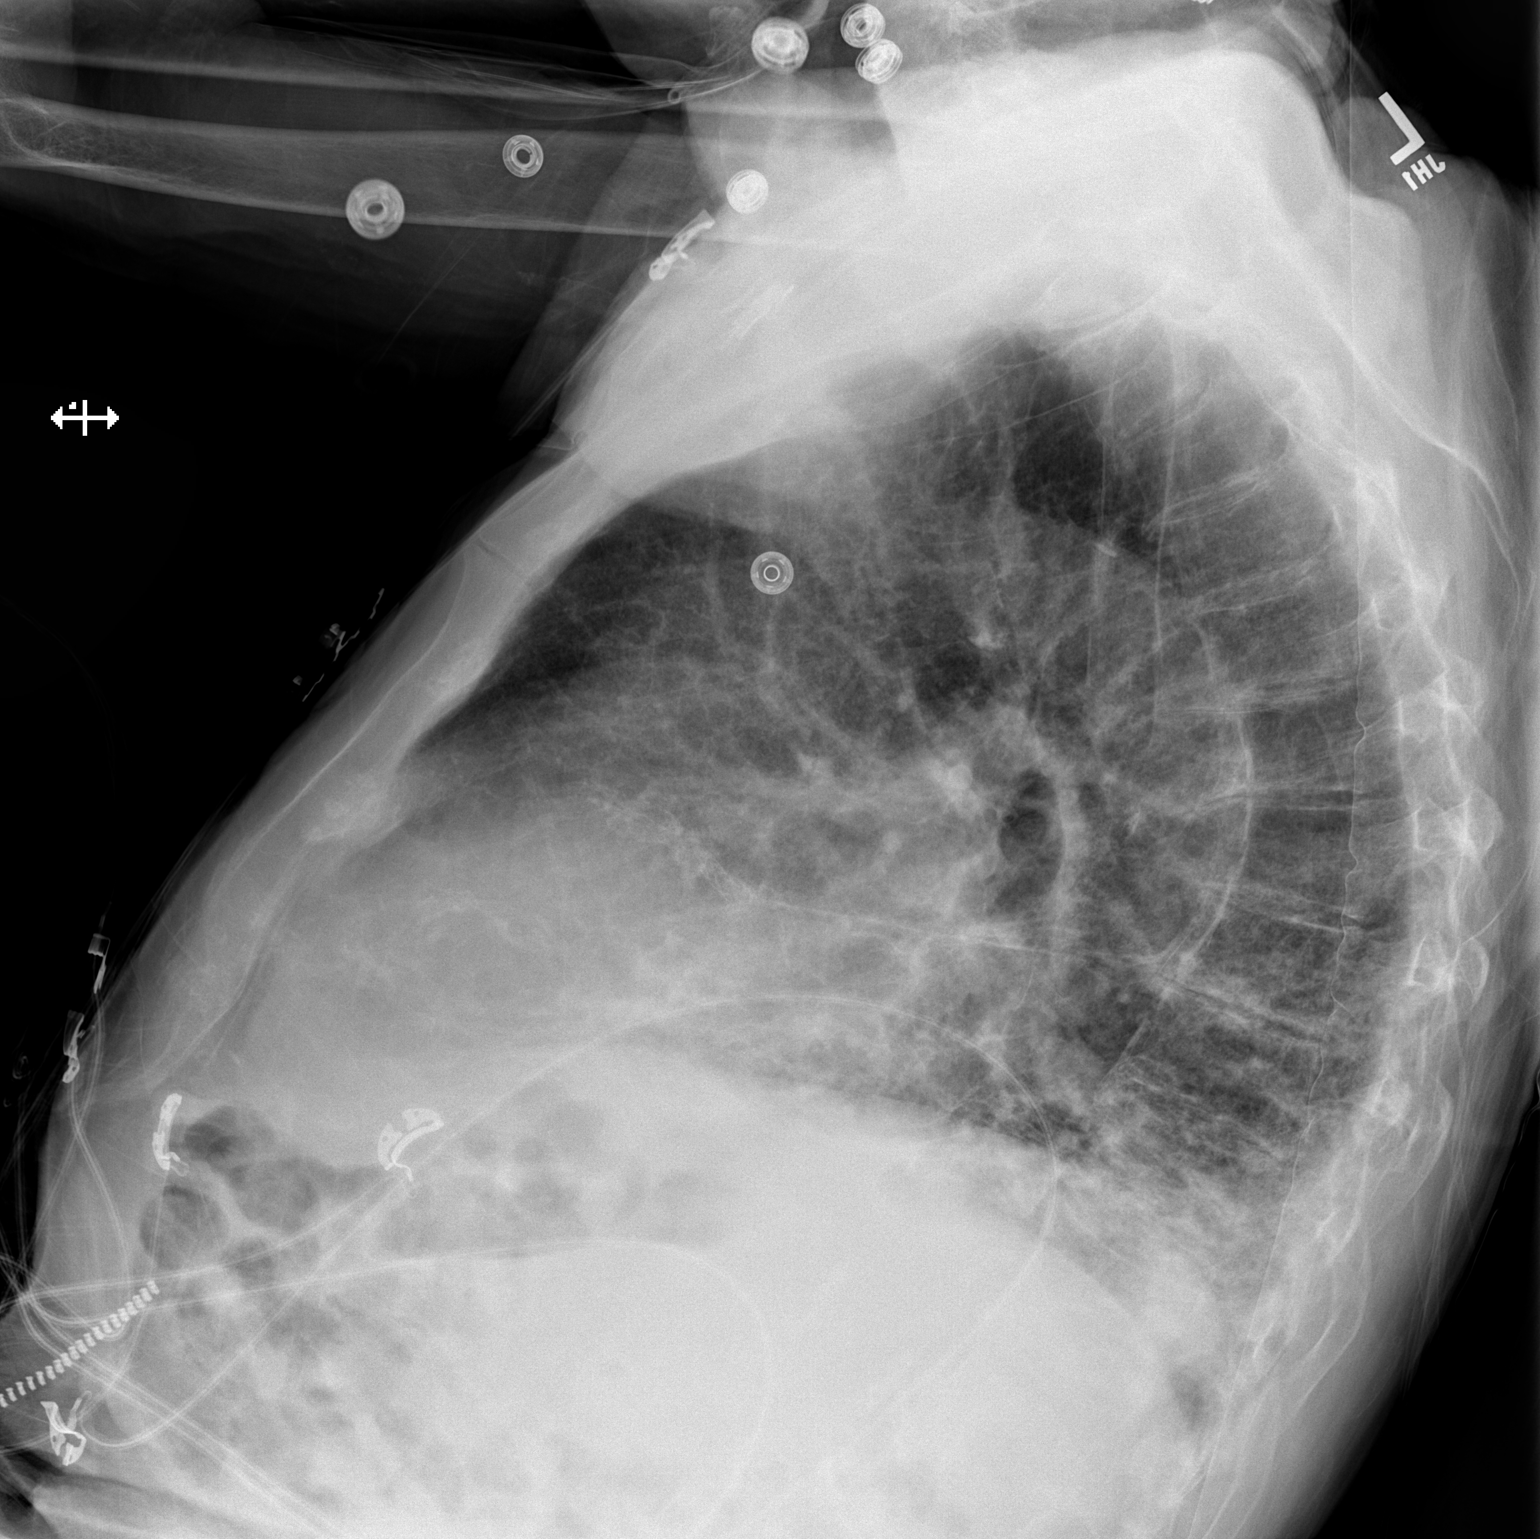

[x chest ap]
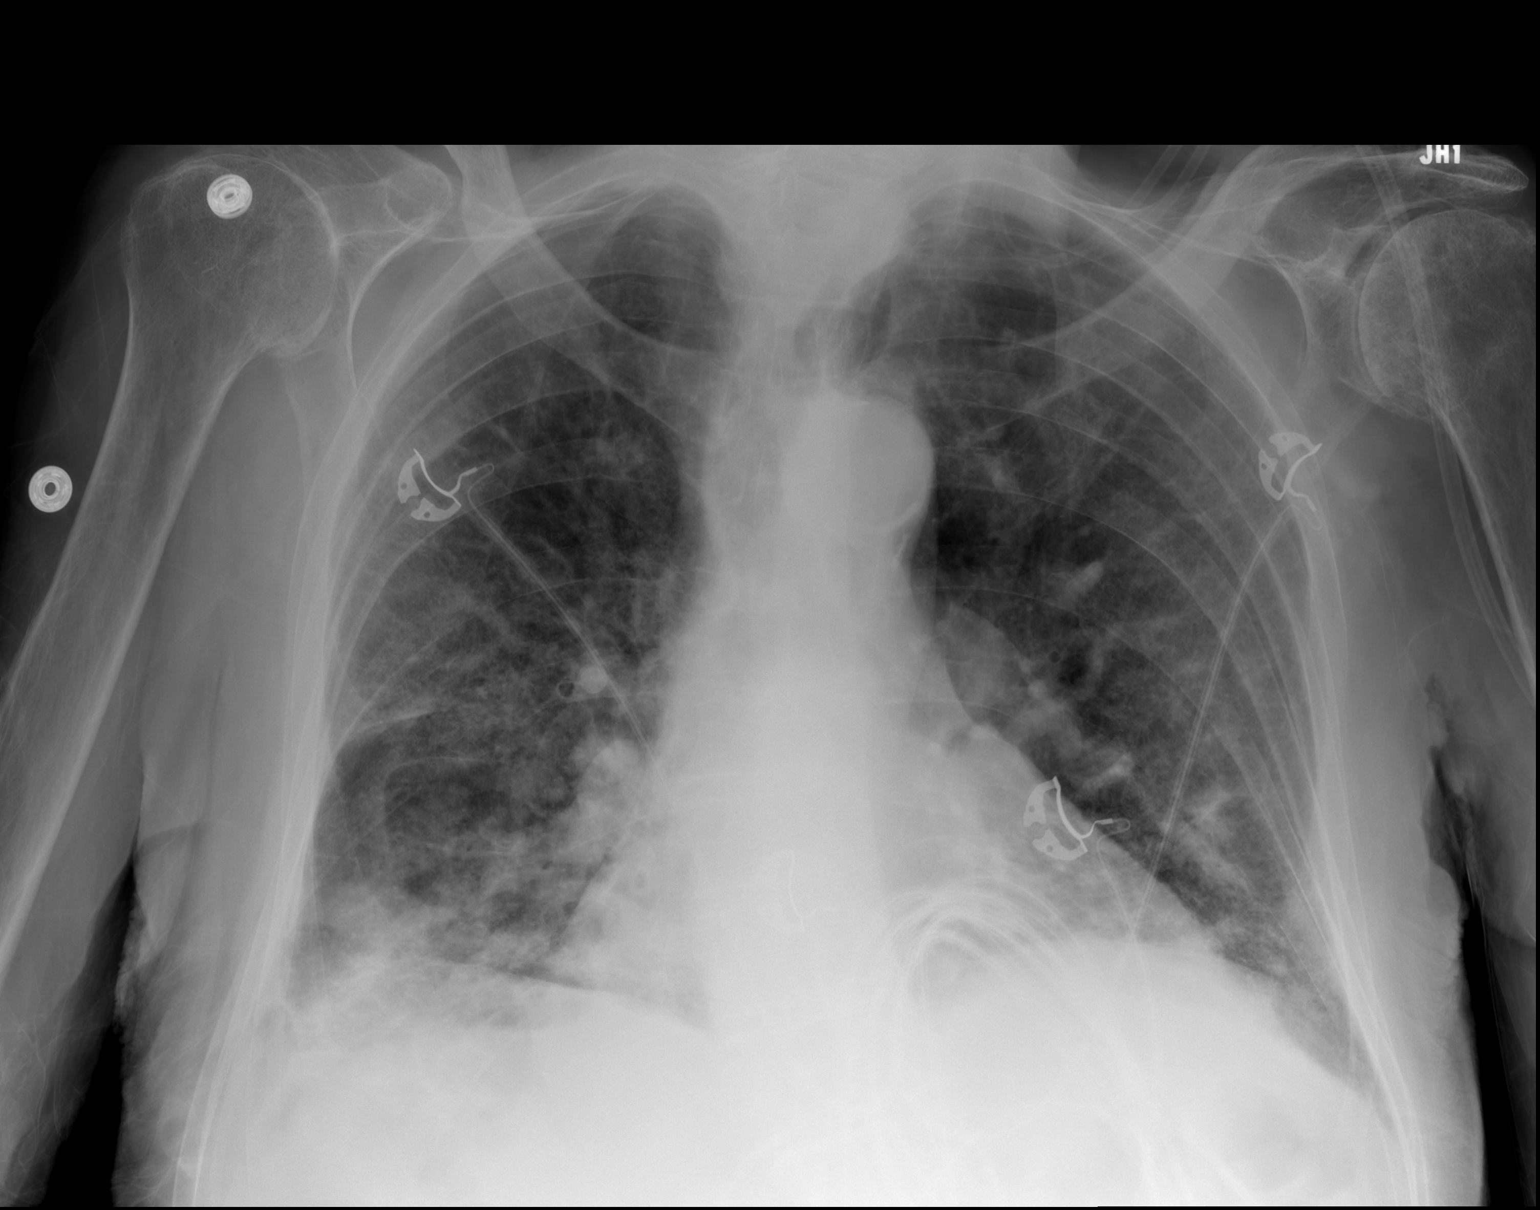

[2 of 2 positions shown; findings below may reference images not displayed]

FINDINGS: Enlarged cardiac silhouette. Calcific atherosclerotic disease and
tortuosity of the aorta.

There is persistent patchy airspace consolidation versus pulmonary
nodules in bilateral lower lobes. Relative hypoinflation of the
lungs. No evidence of pneumothorax or large pleural effusion.

[place]se[place] structures are stable.
IMPRESSION: Persistent patchy airspace consolidation versus pulmonary nodules in
bilateral lower lobes. Given the persistence of this abnormality
malignancy cannot be excluded.

## 2018-05-31 IMAGING — CT CT CHEST W/ CM
2 of 3 series · 15 of 36 positions shown, 18 images · IV contrast (iopamidol)
Comparison: 06/30/2017 chest x-ray

CLINICAL DATA: Pneumonia, recurrent urinary tract infection,
productive cough, hypoxia, fever other

EXAM:
CT CHEST WITH CONTRAST
TECHNIQUE: Multidetector CT imaging of the chest was performed during
intravenous contrast administration.
CONTRAST:  60mL LW14U2-OTT IOPAMIDOL (LW14U2-OTT) INJECTION 61%

[Series 2: axial st · axial · 0.84mm/px · z∈[+1413,+1687]mm · 12 of 161 slices shown, 15 images]
[im 12/161  mediastinal]
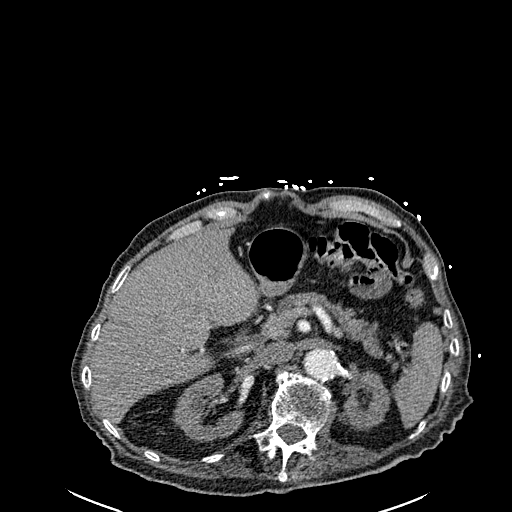
[im 12/161  lung]
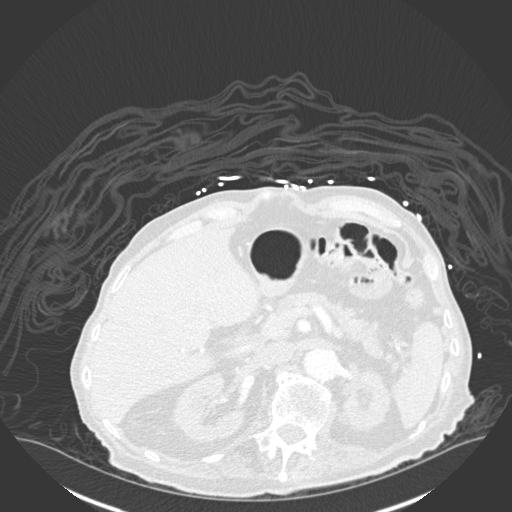
[im 24/161  lung]
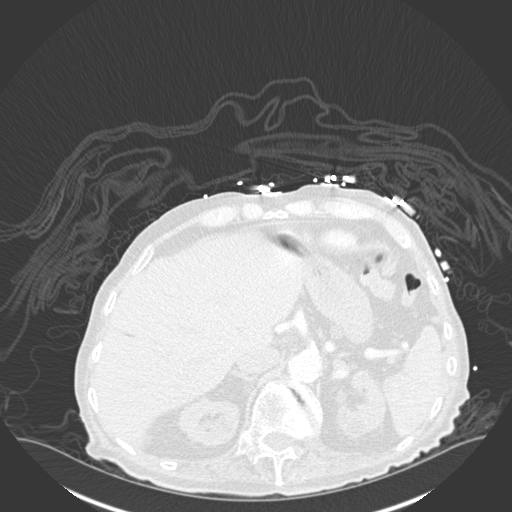
[im 36/161  lung]
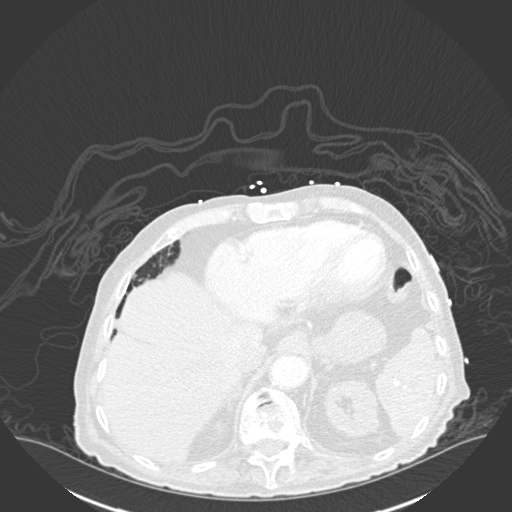
[im 48/161  lung]
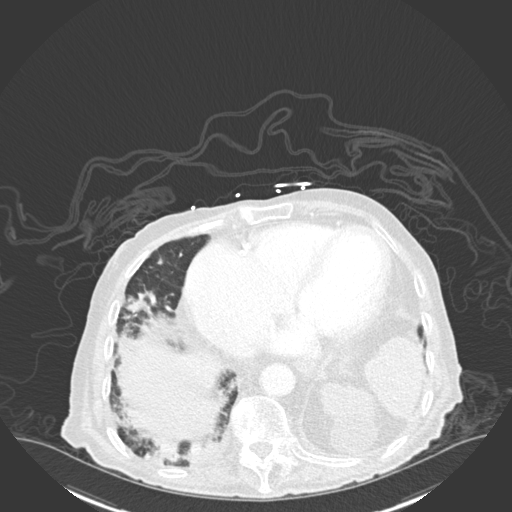
[im 60/161  mediastinal]
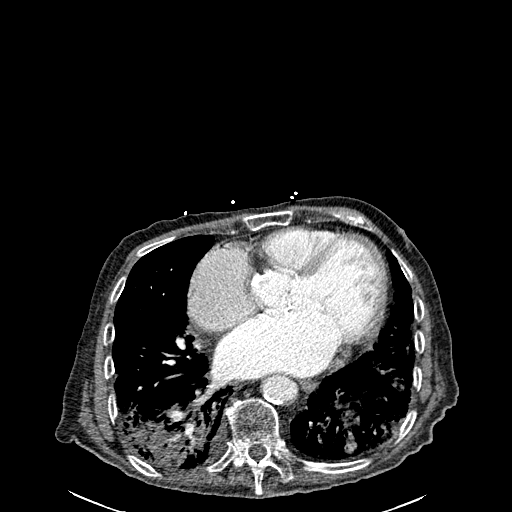
[im 60/161  lung]
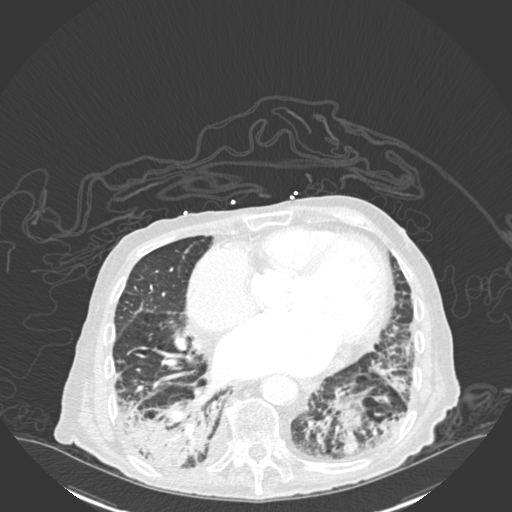
[im 72/161  lung]
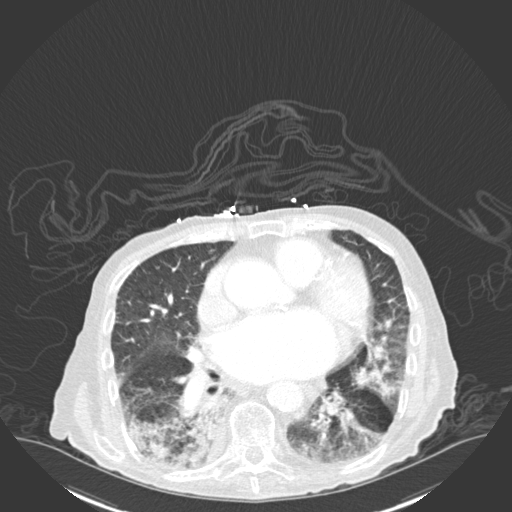
[im 89/161  lung]
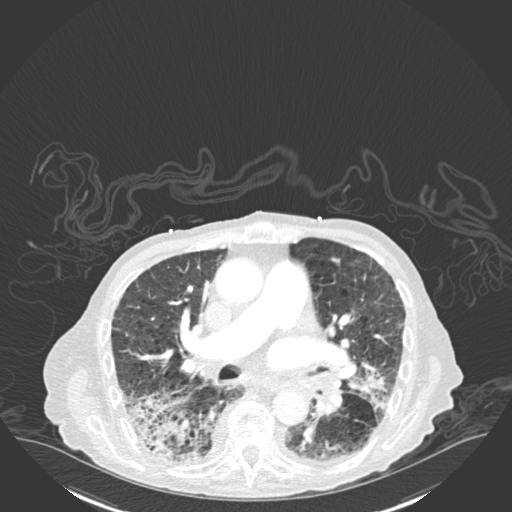
[im 101/161  lung]
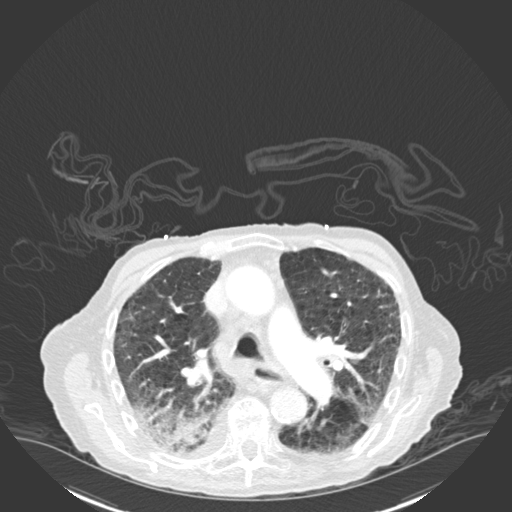
[im 113/161  mediastinal]
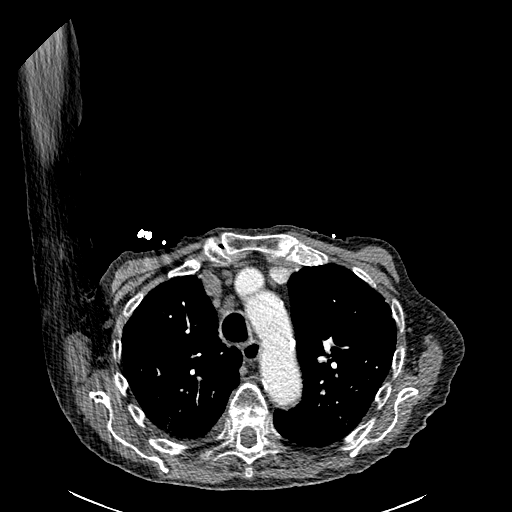
[im 113/161  lung]
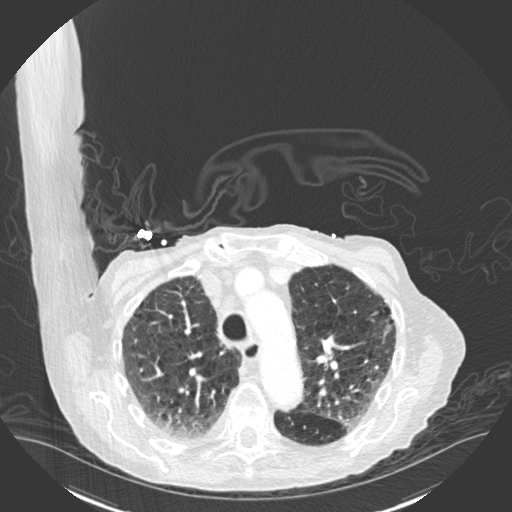
[im 125/161  lung]
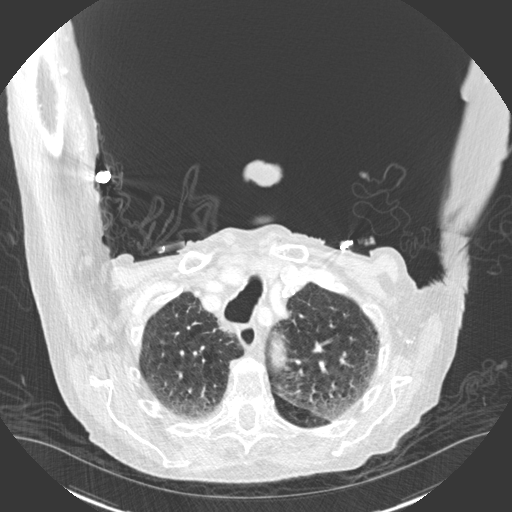
[im 137/161  lung]
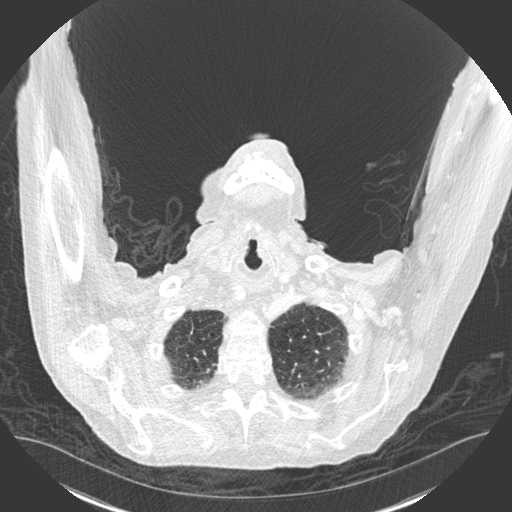
[im 149/161  lung]
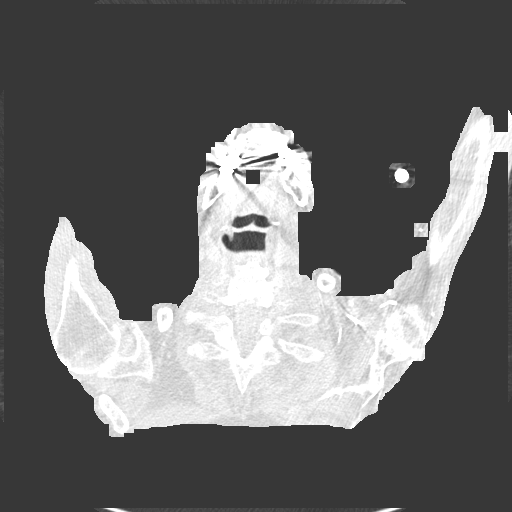

[Series 6: coronal · coronal · 0.65mm/px · 3 of 134 slices shown]
[im 27/134  lung]
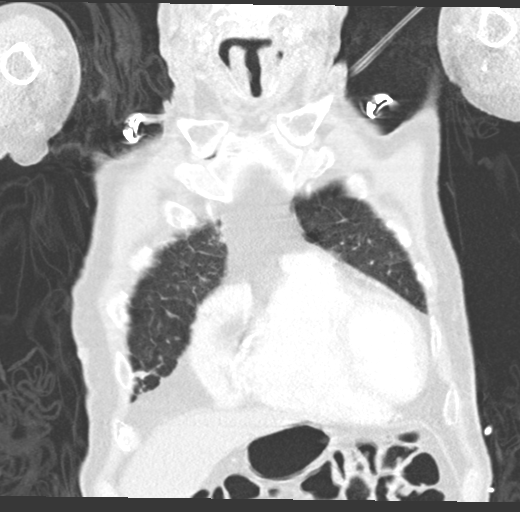
[im 54/134  lung]
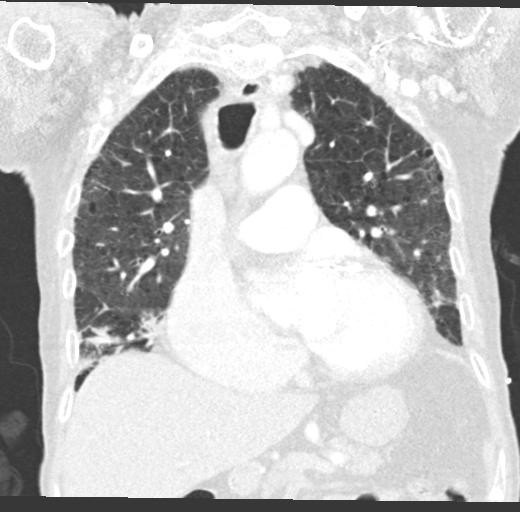
[im 80/134  lung]
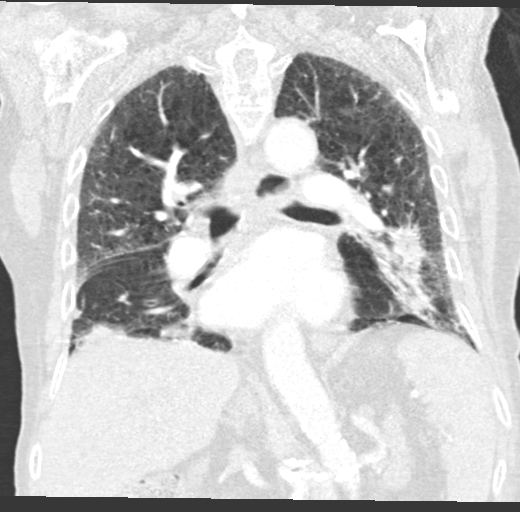

[15 of 36 positions shown; findings below may reference images not displayed]

FINDINGS: Cardiovascular: Atherosclerosis of the thoracic aorta. Two vessel
arch anatomy appearing patent and tortuous. No significant aneurysm
or dissection. Visualized central pulmonary arteries appear patent.
Cardiomegaly noted. Native coronary atherosclerosis present.
Negative for pericardial effusion.

Mediastinum/Nodes: Small prominent scattered mediastinal and hilar
lymph nodes, suspect reactive. Trachea and central airways appear
patent. No hiatal hernia.

Lungs/Pleura: Background emphysema pattern most pronounced in the
upper lobes. Mild associated peripheral interstitial fibrotic
pattern in the upper lobes. Dependent bilateral lower lobe as well
as right upper lobe and left upper lobe patchy and nodular airspace
process compatible with bilateral pneumonia. Pneumonia is most
consolidated in the right lower lobe. No associated interstitial
edema. No pneumothorax. Trace left pleural effusion medially.

Upper Abdomen: Abdominal atherosclerosis noted. Cholelithiasis
present. Suspect left renal cysts. No acute upper abdominal process.

Musculoskeletal: Degenerative changes noted spine and associated
scoliosis. No chest wall soft tissue abnormality or asymmetry.
Degenerative changes of the shoulders. Increased thoracic kyphosis
noted on the lateral view. No acute compression fracture. Sternum
intact.
IMPRESSION: Patchy nodular bilateral airspace process / consolidation compatible
with bilateral pneumonia. Difficult to exclude component of
aspiration.

Underlying chronic emphysema and mild interstitial fibrosis
peripherally.

Trace right pleural effusion medially

Cardiomegaly without pericardial effusion or associated CHF

coronary atherosclerosis

Aortic Atherosclerosis ([id number]-5RT.T) and Emphysema ([id number].Q).
